# Patient Record
Sex: Male | Born: 1950
Health system: Southern US, Community
[De-identification: ages and names within clinical notes are randomized; demographics above are authoritative.]

## PROBLEM LIST (undated history)

## (undated) DIAGNOSIS — R42 Dizziness and giddiness: Secondary | ICD-10-CM

## (undated) DIAGNOSIS — I639 Cerebral infarction, unspecified: Secondary | ICD-10-CM

## (undated) DIAGNOSIS — F329 Major depressive disorder, single episode, unspecified: Secondary | ICD-10-CM

## (undated) DIAGNOSIS — A539 Syphilis, unspecified: Secondary | ICD-10-CM

## (undated) DIAGNOSIS — E785 Hyperlipidemia, unspecified: Secondary | ICD-10-CM

## (undated) DIAGNOSIS — G459 Transient cerebral ischemic attack, unspecified: Secondary | ICD-10-CM

## (undated) DIAGNOSIS — F32A Depression, unspecified: Secondary | ICD-10-CM

## (undated) HISTORY — DX: Hyperlipidemia, unspecified: E78.5

## (undated) HISTORY — DX: Depression, unspecified: F32.A

## (undated) HISTORY — DX: Major depressive disorder, single episode, unspecified: F32.9

## (undated) HISTORY — PX: KNEE ARTHROSCOPY: SHX127

---

## 2010-06-05 ENCOUNTER — Ambulatory Visit: Payer: Self-pay

## 2013-05-14 LAB — HM HIV SCREENING LAB: HM HIV SCREENING: NEGATIVE

## 2013-05-14 LAB — HM HEPATITIS C SCREENING LAB: HM Hepatitis Screen: NEGATIVE

## 2015-03-16 ENCOUNTER — Ambulatory Visit (INDEPENDENT_AMBULATORY_CARE_PROVIDER_SITE_OTHER): Payer: Self-pay | Admitting: Family Medicine

## 2015-03-16 ENCOUNTER — Encounter: Payer: Self-pay | Admitting: Family Medicine

## 2015-03-16 VITALS — BP 125/66 | HR 98 | Resp 16 | Ht 72.0 in | Wt 183.0 lb

## 2015-03-16 DIAGNOSIS — G2581 Restless legs syndrome: Secondary | ICD-10-CM | POA: Insufficient documentation

## 2015-03-16 DIAGNOSIS — F32A Depression, unspecified: Secondary | ICD-10-CM

## 2015-03-16 DIAGNOSIS — R202 Paresthesia of skin: Secondary | ICD-10-CM | POA: Insufficient documentation

## 2015-03-16 DIAGNOSIS — F329 Major depressive disorder, single episode, unspecified: Secondary | ICD-10-CM

## 2015-03-16 DIAGNOSIS — Z23 Encounter for immunization: Secondary | ICD-10-CM

## 2015-03-16 DIAGNOSIS — Z1211 Encounter for screening for malignant neoplasm of colon: Secondary | ICD-10-CM

## 2015-03-16 MED ORDER — ESCITALOPRAM OXALATE 10 MG PO TABS: 10.0000 mg | ORAL_TABLET | Freq: Every day | ORAL | Status: DC

## 2015-03-16 NOTE — Progress Notes (Signed)
Name: Tony Franco   MRN: [id number]    DOB: [birth year]   Date:03/16/2015       Progress Note  Subjective  Chief Complaint  Chief Complaint  Patient presents with  . Insomnia    Trazadone Rx from past not working    HPI Here for f/u of insomnia.  He has had some depressive issues.  Very emotional.  Sleeping problems.  Lots of family health issues.  Feels down and blue.  Having son=mew chronic pain issues. Mood swings.  He has taken antidepressants multiple times over the past 20 or more years.  No problem-specific assessment & plan notes found for this encounter.   Past Medical History  Diagnosis Date  . Hypertension     Past Surgical History  Procedure Laterality Date  . Knee arthroscopy      left knee    Family History  Problem Relation Age of Onset  . Heart attack Mother   . Heart attack Father     Social History   Social History  . Marital Status: Single    Spouse Name: N/A  . Number of Children: N/A  . Years of Education: N/A   Occupational History  . Not on file.   Social History Main Topics  . Smoking status: Never Smoker   . Smokeless tobacco: Never Used  . Alcohol Use: No  . Drug Use: No  . Sexual Activity: Not on file   Other Topics Concern  . Not on file   Social History Narrative  . No narrative on file     Current outpatient prescriptions:  .  escitalopram (LEXAPRO) 10 MG tablet, Take 1 tablet (10 mg total) by mouth daily., Disp: 30 tablet, Rfl: 6  Not on File   Review of Systems  Constitutional: Positive for malaise/fatigue. Negative for fever, chills and weight loss.  HENT: Negative for hearing loss.   Eyes: Negative for blurred vision and double vision.  Respiratory: Negative for cough, shortness of breath and wheezing.   Cardiovascular: Negative for chest pain, palpitations and leg swelling.  Gastrointestinal: Negative for heartburn, abdominal pain and blood in stool.  Genitourinary: Positive for frequency (nocturia). Negative  for dysuria and urgency.  Musculoskeletal: Positive for joint pain and neck pain. Back pain: knees, shoulders.  Skin: Negative for rash.  Neurological: Negative for dizziness, tremors, weakness and headaches.  Psychiatric/Behavioral: Positive for depression. The patient has insomnia.       Objective  Filed Vitals:   03/16/15 0858  BP: 125/66  Pulse: 98  Resp: 16  Height: 6' (1.829 m)  Weight: 183 lb (83.008 kg)    Physical Exam  Constitutional: He is oriented to person, place, and time and well-developed, well-nourished, and in no distress.  HENT:  Head: Normocephalic and atraumatic.  Eyes: Conjunctivae and EOM are normal. Pupils are equal, round, and reactive to light. No scleral icterus.  Neck: Normal range of motion. Neck supple. Carotid bruit is not present. No thyromegaly present.  Cardiovascular: Normal rate and regular rhythm.  Exam reveals no gallop and no friction rub.   No murmur heard. Pulmonary/Chest: Effort normal and breath sounds normal. No respiratory distress. He has no wheezes. He has no rales.  Abdominal: Soft. Bowel sounds are normal. He exhibits no distension, no abdominal bruit and no mass. There is no tenderness.  Musculoskeletal: He exhibits no edema.  Lymphadenopathy:    He has no cervical adenopathy.  Neurological: He is alert and oriented to person, place, and time.  Vitals reviewed.      No results found for this or any previous visit (from the past 2160 hour(s)).   Assessment & Plan  Problem List Items Addressed This Visit      Other   Need for influenza vaccination   Relevant Orders   Flu Vaccine QUAD 36+ mos PF IM (Fluarix & Fluzone Quad PF) (Completed)   Colon cancer screening   Relevant Orders   Ambulatory referral to Gastroenterology   Depression - Primary   Relevant Medications   escitalopram (LEXAPRO) 10 MG tablet      Meds ordered this encounter  Medications  . DISCONTD: baclofen (LIORESAL) 10 MG tablet    Sig: Take  10 mg by mouth.  . DISCONTD: cyanocobalamin (,VITAMIN B-12,) 1000 MCG/ML injection    Sig: 1000 mcg every day for 1 week, then 1000 mcg weekly for 1 month, then continue with monthly injections  . DISCONTD: escitalopram (LEXAPRO) 20 MG tablet    Sig: Take 20 mg by mouth.  . DISCONTD: gabapentin (NEURONTIN) 100 MG capsule    Sig: Take 100 mg by mouth.  . DISCONTD: ibuprofen (ADVIL,MOTRIN) 800 MG tablet    Sig: Take 800 mg by mouth.  . DISCONTD: traZODone (DESYREL) 50 MG tablet    Sig: Take 50 mg by mouth at bedtime. Reported on 03/16/2015  . escitalopram (LEXAPRO) 10 MG tablet    Sig: Take 1 tablet (10 mg total) by mouth daily.    Dispense:  30 tablet    Refill:  6   1. Need for influenza vaccination  - Flu Vaccine QUAD 36+ mos PF IM (Fluarix & Fluzone Quad PF)  2. Depression  - escitalopram (LEXAPRO) 10 MG tablet; Take 1 tablet (10 mg total) by mouth daily.  Dispense: 30 tablet; Refill: 6  3. Colon cancer screening  - Ambulatory referral to Gastroenterology

## 2015-03-17 ENCOUNTER — Telehealth: Payer: Self-pay | Admitting: Family Medicine

## 2015-03-17 NOTE — Telephone Encounter (Signed)
Pt advised.

## 2015-03-17 NOTE — Telephone Encounter (Signed)
Would start with 10 mg.  Can increase to 20 mg in about 3-4 weeks if not at desired response level.-jh

## 2015-03-17 NOTE — Telephone Encounter (Signed)
Pt  Called states that he wanted you to be aware that he was on  lexapro  20 mg  Last yr (Jun 22, 2014) was not sure if this medication should be increase from 10 mg to 20 mg. Pt call back # is  [phone]

## 2015-03-19 ENCOUNTER — Encounter: Payer: Self-pay | Admitting: *Deleted

## 2015-04-20 ENCOUNTER — Ambulatory Visit (INDEPENDENT_AMBULATORY_CARE_PROVIDER_SITE_OTHER): Payer: BLUE CROSS/BLUE SHIELD | Admitting: Family Medicine

## 2015-04-20 ENCOUNTER — Ambulatory Visit (INDEPENDENT_AMBULATORY_CARE_PROVIDER_SITE_OTHER): Payer: BLUE CROSS/BLUE SHIELD | Admitting: General Surgery

## 2015-04-20 ENCOUNTER — Encounter: Payer: Self-pay | Admitting: General Surgery

## 2015-04-20 ENCOUNTER — Encounter: Payer: Self-pay | Admitting: Family Medicine

## 2015-04-20 VITALS — BP 116/67 | HR 72 | Resp 16 | Ht 72.0 in | Wt 185.2 lb

## 2015-04-20 VITALS — BP 120/72 | HR 68 | Resp 12 | Ht 72.0 in | Wt 186.0 lb

## 2015-04-20 DIAGNOSIS — Z1211 Encounter for screening for malignant neoplasm of colon: Secondary | ICD-10-CM

## 2015-04-20 DIAGNOSIS — N522 Drug-induced erectile dysfunction: Secondary | ICD-10-CM

## 2015-04-20 DIAGNOSIS — G4700 Insomnia, unspecified: Secondary | ICD-10-CM | POA: Diagnosis not present

## 2015-04-20 DIAGNOSIS — F32A Depression, unspecified: Secondary | ICD-10-CM

## 2015-04-20 DIAGNOSIS — F329 Major depressive disorder, single episode, unspecified: Secondary | ICD-10-CM

## 2015-04-20 DIAGNOSIS — N529 Male erectile dysfunction, unspecified: Secondary | ICD-10-CM | POA: Insufficient documentation

## 2015-04-20 MED ORDER — POLYETHYLENE GLYCOL 3350 17 GM/SCOOP PO POWD: ORAL | Status: DC

## 2015-04-20 NOTE — Patient Instructions (Addendum)
Colonoscopy A colonoscopy is an exam to look at the entire large intestine (colon). This exam can help find problems such as tumors, polyps, inflammation, and areas of bleeding. The exam takes about 1 hour.  LET [facility] CARE PROVIDER KNOW ABOUT:   Any allergies you have.  All medicines you are taking, including vitamins, herbs, eye drops, creams, and over-the-counter medicines.  Previous problems you or members of your family have had with the use of anesthetics.  Any blood disorders you have.  Previous surgeries you have had.  Medical conditions you have. RISKS AND COMPLICATIONS  Generally, this is a safe procedure. However, as with any procedure, complications can occur. Possible complications include:  Bleeding.  Tearing or rupture of the colon wall.  Reaction to medicines given during the exam.  Infection (rare). BEFORE THE PROCEDURE   Ask your health care provider about changing or stopping your regular medicines.  You may be prescribed an oral bowel prep. This involves drinking a large amount of medicated liquid, starting the day before your procedure. The liquid will cause you to have multiple loose stools until your stool is almost clear or light green. This cleans out your colon in preparation for the procedure.  Do not eat or drink anything else once you have started the bowel prep, unless your health care provider tells you it is safe to do so.  Arrange for someone to drive you home after the procedure. PROCEDURE   You will be given medicine to help you relax (sedative).  You will lie on your side with your knees bent.  A long, flexible tube with a light and camera on the end (colonoscope) will be inserted through the rectum and into the colon. The camera sends video back to a computer screen as it moves through the colon. The colonoscope also releases carbon dioxide gas to inflate the colon. This helps your health care provider see the area better.  During  the exam, your health care provider may take a small tissue sample (biopsy) to be examined under a microscope if any abnormalities are found.  The exam is finished when the entire colon has been viewed. AFTER THE PROCEDURE   Do not drive for 24 hours after the exam.  You may have a small amount of blood in your stool.  You may pass moderate amounts of gas and have mild abdominal cramping or bloating. This is caused by the gas used to inflate your colon during the exam.  Ask when your test results will be ready and how you will get your results. Make sure you get your test results.   This information is not intended to replace advice given to you by your health care provider. Make sure you discuss any questions you have with your health care provider.   Document Released: 01/22/2000 Document Revised: 11/14/2012 Document Reviewed: 10/01/2012 Elsevier Interactive Patient Education [employer].  Patient has been scheduled for a colonoscopy on 04-28-15 at [facility].

## 2015-04-20 NOTE — Progress Notes (Signed)
Name: Tony Franco   MRN: [id number]    DOB: 23-Jan-1951   Date:04/20/2015       Progress Note  Subjective  Chief Complaint  Chief Complaint  Patient presents with  . Depression    HPI Here for f/u of depression and insomnia.  He is sleeping well after initial delayed going to sleep.   He does c/o ED with the Lexapro.     No problem-specific assessment & plan notes found for this encounter.   Past Medical History  Diagnosis Date  . Hypertension     Past Surgical History  Procedure Laterality Date  . Knee arthroscopy      left knee    Family History  Problem Relation Age of Onset  . Heart attack Mother   . Heart attack Father     Social History   Social History  . Marital Status: Single    Spouse Name: N/A  . Number of Children: N/A  . Years of Education: N/A   Occupational History  . Not on file.   Social History Main Topics  . Smoking status: Never Smoker   . Smokeless tobacco: Never Used  . Alcohol Use: No  . Drug Use: No  . Sexual Activity: Not on file   Other Topics Concern  . Not on file   Social History Narrative     Current outpatient prescriptions:  .  doxylamine, Sleep, (UNISOM) 25 MG tablet, Take 25 mg by mouth at bedtime as needed., Disp: , Rfl:  .  escitalopram (LEXAPRO) 10 MG tablet, Take 1 tablet (10 mg total) by mouth daily., Disp: 30 tablet, Rfl: 6  Not on File   Review of Systems  Constitutional: Negative for fever, chills, weight loss and malaise/fatigue.  HENT: Negative for hearing loss.   Eyes: Negative for blurred vision and double vision.  Respiratory: Negative for cough, shortness of breath and wheezing.   Cardiovascular: Negative for chest pain, palpitations and leg swelling.  Gastrointestinal: Negative for heartburn, abdominal pain and blood in stool.  Genitourinary: Negative for dysuria, urgency and frequency.       Erectile dysfuncton  Skin: Negative for rash.  Neurological: Negative for weakness and headaches.   Psychiatric/Behavioral: Positive for depression. The patient has insomnia.       Objective  Filed Vitals:   04/20/15 1029  BP: 116/67  Pulse: 72  Resp: 16  Height: 6' (1.829 m)  Weight: 185 lb 3.2 oz (84.006 kg)    Physical Exam  Constitutional: He is oriented to person, place, and time and well-developed, well-nourished, and in no distress. No distress.  HENT:  Head: Normocephalic and atraumatic.  Eyes: Conjunctivae and EOM are normal. Pupils are equal, round, and reactive to light. No scleral icterus.  Neck: Normal range of motion. Neck supple. No thyromegaly present.  Cardiovascular: Normal rate, regular rhythm and normal heart sounds.  Exam reveals no gallop and no friction rub.   No murmur heard. Pulmonary/Chest: Effort normal and breath sounds normal. No respiratory distress. He has no wheezes. He has no rales.  Abdominal: Soft. Bowel sounds are normal. He exhibits no distension and no mass. There is no tenderness.  Musculoskeletal: He exhibits no edema.  Lymphadenopathy:    He has no cervical adenopathy.  Neurological: He is alert and oriented to person, place, and time.  Vitals reviewed.      No results found for this or any previous visit (from the past 2160 hour(s)).   Assessment & Plan  Problem  List Items Addressed This Visit      Genitourinary   ED (erectile dysfunction) - Primary     Other   Depression   Insomnia      Meds ordered this encounter  Medications  . doxylamine, Sleep, (UNISOM) 25 MG tablet    Sig: Take 25 mg by mouth at bedtime as needed.   1. Depression Cont. Lexapro  2. Insomnia Cont Unisom  3. Drug-induced erectile dysfunction Try Sildenifil, 20 mg., 1-5 prn

## 2015-04-20 NOTE — Progress Notes (Signed)
Patient ID: Tony Franco, male   DOB: 02-29-1950, 65 y.o.   MRN: [id number]  Chief Complaint  Patient presents with  . Colonoscopy    [name] is a 65 y.o. male here today for a evaluation of a screening colonoscopy. No previous colonoscopy. No GI problems at this time. No family history of cancer. I have reviewed the history of present illness with the patient.   HPI  Past Medical History  Diagnosis Date  . Hypertension     Past Surgical History  Procedure Laterality Date  . Knee arthroscopy      left knee    Family History  Problem Relation Age of Onset  . Heart attack Mother   . Heart attack Father     Social History Social History  Substance Use Topics  . Smoking status: Never Smoker   . Smokeless tobacco: Never Used  . Alcohol Use: No    No Known Allergies  Current Outpatient Prescriptions  Medication Sig Dispense Refill  . doxylamine, Sleep, (UNISOM) 25 MG tablet Take 25 mg by mouth at bedtime as needed.    Marland Kitchen escitalopram (LEXAPRO) 10 MG tablet Take 1 tablet (10 mg total) by mouth daily. 30 tablet 6  . polyethylene glycol powder (GLYCOLAX/MIRALAX) powder 255 grams one bottle for colonoscopy prep 255 g 0   No current facility-administered medications for this visit.    Review of Systems Review of Systems  Constitutional: Negative.   Respiratory: Negative.   Cardiovascular: Negative.     Blood pressure 120/72, pulse 68, resp. rate 12, height 6' (1.829 m), weight 186 lb (84.369 kg).  Physical Exam Physical Exam  Constitutional: He is oriented to person, place, and time. He appears well-developed and well-nourished.  Eyes: Conjunctivae are normal. No scleral icterus.  Neck: Neck supple.  Cardiovascular: Normal rate, regular rhythm and normal heart sounds.   Pulmonary/Chest: Effort normal and breath sounds normal.  Abdominal: Soft. Normal appearance and bowel sounds are normal. There is no hepatomegaly. There is no tenderness. No hernia.     Lymphadenopathy:    He has no cervical adenopathy.  Neurological: He is alert and oriented to person, place, and time.  Skin: Skin is warm and dry.  Psychiatric: His behavior is normal.    Data Reviewed None  Assessment    Stable exam, no significant findings. Small lipoma on right side of abdomen noted. Pt at average risk for colon cancer. He was advised on role of screening colonoscopy.     Plan    Colonoscopy with possible biopsy/polypectomy prn: Information regarding the procedure, including its potential risks and complications (including but not limited to perforation of the bowel, which may require emergency surgery to repair, and bleeding) was verbally given to the patient. Educational information regarding lower intestinal endoscopy was given to the patient. Written instructions for how to complete the bowel prep using Miralax were provided. The importance of drinking ample fluids to avoid dehydration as a result of the prep emphasized.     Patient has been scheduled for a colonoscopy on 04-28-15 at [facility].   PCP:  Philbert Riser, Nikki Dom This information has been scribed by Gaspar Cola CMA.    SANKAR,SEEPLAPUTHUR G 04/20/2015, 2:08 PM

## 2015-04-28 ENCOUNTER — Encounter
Admission: RE | Disposition: A | Discharge status: Home / Self Care | Payer: Self-pay | Source: Ambulatory Visit | Attending: General Surgery

## 2015-04-28 ENCOUNTER — Ambulatory Visit
Admission: RE | Admit: 2015-04-28 | Discharge: 2015-04-28 | Disposition: A | Discharge status: Home / Self Care | Payer: BLUE CROSS/BLUE SHIELD | Source: Ambulatory Visit | Attending: General Surgery | Admitting: General Surgery

## 2015-04-28 ENCOUNTER — Encounter: Payer: Self-pay | Admitting: *Deleted

## 2015-04-28 ENCOUNTER — Ambulatory Visit: Payer: BLUE CROSS/BLUE SHIELD | Admitting: *Deleted

## 2015-04-28 DIAGNOSIS — K5730 Diverticulosis of large intestine without perforation or abscess without bleeding: Secondary | ICD-10-CM | POA: Insufficient documentation

## 2015-04-28 DIAGNOSIS — Z9889 Other specified postprocedural states: Secondary | ICD-10-CM | POA: Insufficient documentation

## 2015-04-28 DIAGNOSIS — Z1211 Encounter for screening for malignant neoplasm of colon: Secondary | ICD-10-CM | POA: Insufficient documentation

## 2015-04-28 DIAGNOSIS — Z8249 Family history of ischemic heart disease and other diseases of the circulatory system: Secondary | ICD-10-CM | POA: Diagnosis not present

## 2015-04-28 DIAGNOSIS — I10 Essential (primary) hypertension: Secondary | ICD-10-CM | POA: Insufficient documentation

## 2015-04-28 DIAGNOSIS — D171 Benign lipomatous neoplasm of skin and subcutaneous tissue of trunk: Secondary | ICD-10-CM | POA: Insufficient documentation

## 2015-04-28 DIAGNOSIS — Z79899 Other long term (current) drug therapy: Secondary | ICD-10-CM | POA: Insufficient documentation

## 2015-04-28 HISTORY — PX: COLONOSCOPY WITH PROPOFOL: SHX5780

## 2015-04-28 SURGERY — COLONOSCOPY WITH PROPOFOL
Anesthesia: General

## 2015-04-28 MED ORDER — SODIUM CHLORIDE 0.9 % IV SOLN
INTRAVENOUS | Status: DC
  Administered 2015-04-28: 1000 mL via INTRAVENOUS
  Administered 2015-04-28: 14:00:00 via INTRAVENOUS

## 2015-04-28 MED ORDER — PROPOFOL 500 MG/50ML IV EMUL
INTRAVENOUS | Status: DC | PRN
  Administered 2015-04-28: 100 ug/kg/min via INTRAVENOUS

## 2015-04-28 NOTE — Anesthesia Preprocedure Evaluation (Signed)
Anesthesia Evaluation  Patient identified by MRN, date of birth, ID band Patient awake    Reviewed: Allergy & Precautions, NPO status , Patient's Chart, lab work & pertinent test results  Airway Mallampati: II  TM Distance: >3 FB     Dental no notable dental hx.    Pulmonary neg pulmonary ROS,    Pulmonary exam normal        Cardiovascular hypertension, Pt. on medications Normal cardiovascular exam     Neuro/Psych Depression negative neurological ROS     GI/Hepatic negative GI ROS, Neg liver ROS,   Endo/Other  negative endocrine ROS  Renal/GU negative Renal ROS  negative genitourinary   Musculoskeletal negative musculoskeletal ROS (+)   Abdominal Normal abdominal exam  (+)   Peds negative pediatric ROS (+)  Hematology negative hematology ROS (+)   Anesthesia Other Findings   Reproductive/Obstetrics                             Anesthesia Physical Anesthesia Plan  ASA: II  Anesthesia Plan: General   Post-op Pain Management:    Induction: Intravenous  Airway Management Planned: Nasal Cannula  Additional Equipment:   Intra-op Plan:   Post-operative Plan:   Informed Consent: I have reviewed the patients History and Physical, chart, labs and discussed the procedure including the risks, benefits and alternatives for the proposed anesthesia with the patient or authorized representative who has indicated his/her understanding and acceptance.   Dental advisory given  Plan Discussed with: CRNA and Surgeon  Anesthesia Plan Comments:         Anesthesia Quick Evaluation

## 2015-04-28 NOTE — Op Note (Signed)
[facility] Patient Name: Tony Franco Procedure Date: 04/28/2015 2:19 PM MRN: [id number] Account #: [id number] Date of Birth: 1950/11/13 Admit Type: Outpatient Age: 65 Room: [facility] ENDO ROOM 4 Gender: Male Note Status: Finalized Procedure:            Colonoscopy Indications:          Screening for colorectal malignant neoplasm Providers:            Fredda Clarida G. Jamal Collin, MD Referring MD:         Arlis Porta, MD (Referring MD) Medicines:            General Anesthesia Complications:        No immediate complications. Procedure:            Pre-Anesthesia Assessment:                       - General anesthesia under the supervision of an                        anesthesiologist was determined to be medically                        necessary for this procedure based on review of the                        patient's medical history, medications, and prior                        anesthesia history.                       After obtaining informed consent, the colonoscope was                        passed under direct vision. Throughout the procedure,                        the patient's blood pressure, pulse, and oxygen                        saturations were monitored continuously. The Olympus                        PCF-H180AL colonoscope ( S#: [id number] ) was introduced                        through the anus and advanced to the the cecum,                        identified by the ileocecal valve. The colonoscopy was                        performed without difficulty. The patient tolerated the                        procedure well. The quality of the bowel preparation                        was excellent. Findings:      The perianal and digital rectal examinations were normal.  A few small-mouthed diverticula were found in the sigmoid colon,       descending colon and transverse colon.      The exam was otherwise without abnormality on direct  and retroflexion       views. Impression:           - Diverticulosis in the sigmoid colon, in the                        descending colon and in the transverse colon.                       - The examination was otherwise normal on direct and                        retroflexion views.                       - No specimens collected. Recommendation:       - Discharge patient to home.                       - Return to primary care physician as previously                        scheduled. Procedure Code(s):    --- Professional ---                       [phone], Colonoscopy, flexible; diagnostic, including                        collection of specimen(s) by brushing or washing, when                        performed (separate procedure) Diagnosis Code(s):    --- Professional ---                       Z12.11, Encounter for screening for malignant neoplasm                        of colon                       K57.30, Diverticulosis of large intestine without                        perforation or abscess without bleeding CPT copyright 2016 American Medical Association. All rights reserved. The codes documented in this report are preliminary and upon coder review may  be revised to meet current compliance requirements. Christene Lye, MD 04/28/2015 2:41:04 PM This report has been signed electronically. Number of Addenda: 0 Note Initiated On: 04/28/2015 2:19 PM Scope Withdrawal Time: 0 hours 5 minutes 26 seconds  Total Procedure Duration: 0 hours 13 minutes 9 seconds       [facility]

## 2015-04-28 NOTE — Transfer of Care (Signed)
Immed iate Anesthesia Transfer of Care Note  Patient: Tony Franco  Procedure(s) Performed: Procedure(s): COLONOSCOPY WITH PROPOFOL (N/A)  Patient Location: PACU  Anesthesia Type:General  Level of Consciousness: awake, alert  and oriented  Airway & Oxygen Therapy: Patient Spontanous Breathing and Patient connected to nasal cannula oxygen  Post-op Assessment: Report given to RN and Post -op Vital signs reviewed and stable  Post vital signs: Reviewed and stable  Last Vitals:  Filed Vitals:   04/28/15 1316 04/28/15 1445  BP: 126/81 107/73  Pulse: 104 72  Temp: 36.2 C 36.5 C  Resp: 20 16    Complications: No apparent anesthesia complications

## 2015-04-28 NOTE — H&P (View-Only) (Signed)
Patient ID: Tony Franco, male   DOB: 1950-07-07, 65 y.o.   MRN: [id number]  Chief Complaint  Patient presents with  . Colonoscopy    HPI Tony Franco is a 65 y.o. male here today for a evaluation of a screening colonoscopy. No previous colonoscopy. No GI problems at this time. No family history of cancer. I have reviewed the history of present illness with the patient.   HPI  Past Medical History  Diagnosis Date  . Hypertension     Past Surgical History  Procedure Laterality Date  . Knee arthroscopy      left knee    Family History  Problem Relation Age of Onset  . Heart attack Mother   . Heart attack Father     Social History Social History  Substance Use Topics  . Smoking status: Never Smoker   . Smokeless tobacco: Never Used  . Alcohol Use: No    No Known Allergies  Current Outpatient Prescriptions  Medication Sig Dispense Refill  . doxylamine, Sleep, (UNISOM) 25 MG tablet Take 25 mg by mouth at bedtime as needed.    Marland Kitchen escitalopram (LEXAPRO) 10 MG tablet Take 1 tablet (10 mg total) by mouth daily. 30 tablet 6  . polyethylene glycol powder (GLYCOLAX/MIRALAX) powder 255 grams one bottle for colonoscopy prep 255 g 0   No current facility-administered medications for this visit.    Review of Systems Review of Systems  Constitutional: Negative.   Respiratory: Negative.   Cardiovascular: Negative.     Blood pressure 120/72, pulse 68, resp. rate 12, height 6' (1.829 m), weight 186 lb (84.369 kg).  Physical Exam Physical Exam  Constitutional: He is oriented to person, place, and time. He appears well-developed and well-nourished.  Eyes: Conjunctivae are normal. No scleral icterus.  Neck: Neck supple.  Cardiovascular: Normal rate, regular rhythm and normal heart sounds.   Pulmonary/Chest: Effort normal and breath sounds normal.  Abdominal: Soft. Normal appearance and bowel sounds are normal. There is no hepatomegaly. There is no tenderness. No hernia.     Lymphadenopathy:    He has no cervical adenopathy.  Neurological: He is alert and oriented to person, place, and time.  Skin: Skin is warm and dry.  Psychiatric: His behavior is normal.    Data Reviewed None  Assessment    Stable exam, no significant findings. Small lipoma on right side of abdomen noted. Pt at average risk for colon cancer. He was advised on role of screening colonoscopy.     Plan    Colonoscopy with possible biopsy/polypectomy prn: Information regarding the procedure, including its potential risks and complications (including but not limited to perforation of the bowel, which may require emergency surgery to repair, and bleeding) was verbally given to the patient. Educational information regarding lower intestinal endoscopy was given to the patient. Written instructions for how to complete the bowel prep using Miralax were provided. The importance of drinking ample fluids to avoid dehydration as a result of the prep emphasized.     Patient has been scheduled for a colonoscopy on 04-28-15 at [facility].   PCP:  Philbert Riser, Nikki Dom This information has been scribed by Gaspar Cola CMA.    SANKAR,SEEPLAPUTHUR G 04/20/2015, 2:08 PM

## 2015-04-28 NOTE — Anesthesia Postprocedure Evaluation (Signed)
Anesthesia Post Note  Patient: Tony Franco  Procedure(s) Performed: Procedure(s) (LRB): COLONOSCOPY WITH PROPOFOL (N/A)  Patient location during evaluation: PACU Anesthesia Type: General Level of consciousness: awake and alert and oriented Pain management: pain level controlled Vital Signs Assessment: post-procedure vital signs reviewed and stable Respiratory status: spontaneous breathing Cardiovascular status: blood pressure returned to baseline Anesthetic complications: no    Last Vitals:  Filed Vitals:   04/28/15 1505 04/28/15 1515  BP: 120/83 125/83  Pulse: 78 72  Temp:    Resp: 20 13    Last Pain: There were no vitals filed for this visit.               Glenis Musolf

## 2015-04-28 NOTE — Interval H&P Note (Signed)
History and Physical Interval Note:  04/28/2015 2:13 PM  Tony Franco  has presented today for surgery, with the diagnosis of SCREENING  The various methods of treatment have been discussed with the patient and family. After consideration of risks, benefits and other options for treatment, the patient has consented to  Procedure(s): COLONOSCOPY WITH PROPOFOL (N/A) as a surgical intervention .  The patient's history has been reviewed, patient examined, no change in status, stable for surgery.  I have reviewed the patient's chart and labs.  Questions were answered to the patient's satisfaction.     SANKAR,SEEPLAPUTHUR G

## 2015-04-29 ENCOUNTER — Encounter: Payer: Self-pay | Admitting: General Surgery

## 2015-04-29 NOTE — Addendum Note (Signed)
Addendum  created 04/29/15 1102 by [place] edited: Anesthesia Responsible Staff

## 2015-05-18 ENCOUNTER — Ambulatory Visit: Payer: Self-pay | Admitting: Family Medicine

## 2015-07-30 DIAGNOSIS — R079 Chest pain, unspecified: Secondary | ICD-10-CM | POA: Diagnosis not present

## 2015-07-30 DIAGNOSIS — R0789 Other chest pain: Secondary | ICD-10-CM | POA: Diagnosis not present

## 2015-08-12 ENCOUNTER — Telehealth: Payer: Self-pay | Admitting: Family Medicine

## 2015-08-12 NOTE — Telephone Encounter (Signed)
Pt. Called wanted to know the name of a ophthalmologist/ Pt call call back # is [phone]

## 2015-08-12 NOTE — Telephone Encounter (Signed)
Returned call to patient and left vmail # for [facility].

## 2015-08-17 ENCOUNTER — Ambulatory Visit (INDEPENDENT_AMBULATORY_CARE_PROVIDER_SITE_OTHER): Payer: BLUE CROSS/BLUE SHIELD | Admitting: Family Medicine

## 2015-08-17 ENCOUNTER — Encounter: Payer: Self-pay | Admitting: Family Medicine

## 2015-08-17 VITALS — BP 94/59 | HR 71 | Temp 98.0°F | Resp 16 | Ht 72.0 in | Wt 184.0 lb

## 2015-08-17 DIAGNOSIS — G4700 Insomnia, unspecified: Secondary | ICD-10-CM | POA: Diagnosis not present

## 2015-08-17 DIAGNOSIS — F329 Major depressive disorder, single episode, unspecified: Secondary | ICD-10-CM | POA: Diagnosis not present

## 2015-08-17 DIAGNOSIS — F32A Depression, unspecified: Secondary | ICD-10-CM

## 2015-08-17 MED ORDER — TRAZODONE HCL 50 MG PO TABS: ORAL_TABLET | ORAL | Status: DC

## 2015-08-17 MED ORDER — ESCITALOPRAM OXALATE 10 MG PO TABS: 10.0000 mg | ORAL_TABLET | Freq: Every day | ORAL | Status: DC

## 2015-08-17 NOTE — Patient Instructions (Signed)
May increase to 2, 50 mg tabs at night in 4-6 weeks if needed.

## 2015-08-17 NOTE — Progress Notes (Signed)
Name: Tony Franco   MRN: [id number]    DOB: 10-29-1950   Date:08/17/2015       Progress Note  Subjective  Chief Complaint  Chief Complaint  Patient presents with  . Depression  . Insomnia    HPI Here for f/u of depression and insomnia.  PHQ-9 score of 6.  He still has major problems with insomnia.  Nothing OTC works.  Feels very tired due to poor sleep.  Trouble going to sleep and staying asleep.  Had episode of chest pain 10 days ago that lasted 10-15 min.  To ER for w/u.  No acute problems found.  He has had episodes of this in past that has always had neg card w/u.  No problem-specific assessment & plan notes found for this encounter.   Past Medical History  Diagnosis Date  . Hypertension     Past Surgical History  Procedure Laterality Date  . Knee arthroscopy      left knee  . Colonoscopy with propofol N/A 04/28/2015    Procedure: COLONOSCOPY WITH PROPOFOL;  Surgeon: Christene Lye, MD;  Location: [facility] ENDOSCOPY;  Service: Endoscopy;  Laterality: N/A;    Family History  Problem Relation Age of Onset  . Heart attack Mother   . Heart attack Father     Social History   Social History  . Marital Status: Single    Spouse Name: N/A  . Number of Children: N/A  . Years of Education: N/A   Occupational History  . Not on file.   Social History Main Topics  . Smoking status: Never Smoker   . Smokeless tobacco: Never Used  . Alcohol Use: 0.6 oz/week    1 Cans of beer per week  . Drug Use: No  . Sexual Activity: Not on file   Other Topics Concern  . Not on file   Social History Narrative     Current outpatient prescriptions:  .  escitalopram (LEXAPRO) 10 MG tablet, Take 1 tablet (10 mg total) by mouth daily., Disp: 30 tablet, Rfl: 6 .  doxylamine, Sleep, (UNISOM) 25 MG tablet, Take 25 mg by mouth at bedtime as needed. Reported on 08/17/2015, Disp: , Rfl:  .  traZODone (DESYREL) 50 MG tablet, Take 1 tablet at night for sleep, Disp: 30 tablet, Rfl:  6  Not on File   Review of Systems  Constitutional: Negative for fever, chills, weight loss and malaise/fatigue.  HENT: Negative for hearing loss.   Eyes: Negative for blurred vision and double vision.  Respiratory: Negative for cough, shortness of breath and wheezing.   Cardiovascular: Negative for chest pain, palpitations and leg swelling.  Gastrointestinal: Negative for heartburn, abdominal pain and blood in stool.  Genitourinary: Negative for dysuria, urgency and frequency.  Musculoskeletal: Negative for myalgias and joint pain.  Skin: Negative for rash.  Neurological: Negative for dizziness, tremors, weakness and headaches.  Psychiatric/Behavioral: Positive for depression. The patient has insomnia.       Objective  Filed Vitals:   08/17/15 0921  BP: 94/59  Pulse: 71  Temp: 98 F (36.7 C)  TempSrc: Oral  Resp: 16  Height: 6' (1.829 m)  Weight: 184 lb (83.462 kg)    Physical Exam  Constitutional: He is oriented to person, place, and time and well-developed, well-nourished, and in no distress. No distress.  HENT:  Head: Normocephalic and atraumatic.  Eyes: Conjunctivae and EOM are normal. Pupils are equal, round, and reactive to light. No scleral icterus.  Neck: Normal range of  motion. Neck supple. No thyromegaly present.  Cardiovascular: Normal rate and regular rhythm.  Exam reveals no gallop and no friction rub.   No murmur heard. Pulmonary/Chest: Effort normal and breath sounds normal. No respiratory distress. He has no wheezes. He has no rales.  Abdominal: Bowel sounds are normal. He exhibits no distension and no mass. There is no tenderness.  Musculoskeletal: He exhibits no edema.  Lymphadenopathy:    He has no cervical adenopathy.  Neurological: He is alert and oriented to person, place, and time.  Vitals reviewed.      No results found for this or any previous visit (from the past 2160 hour(s)).   Assessment & Plan  Problem List Items Addressed  This Visit      Other   Depression   Relevant Medications   traZODone (DESYREL) 50 MG tablet   escitalopram (LEXAPRO) 10 MG tablet   Insomnia - Primary   Relevant Medications   traZODone (DESYREL) 50 MG tablet      Meds ordered this encounter  Medications  . traZODone (DESYREL) 50 MG tablet    Sig: Take 1 tablet at night for sleep    Dispense:  30 tablet    Refill:  6  . escitalopram (LEXAPRO) 10 MG tablet    Sig: Take 1 tablet (10 mg total) by mouth daily.    Dispense:  30 tablet    Refill:  6   1. Insomnia  - traZODone (DESYREL) 50 MG tablet; Take 1 tablet at night for sleep  Dispense: 30 tablet; Refill: 6  2. Depression  - escitalopram (LEXAPRO) 10 MG tablet; Take 1 tablet (10 mg total) by mouth daily.  Dispense: 30 tablet; Refill: 6

## 2015-08-18 DIAGNOSIS — H2513 Age-related nuclear cataract, bilateral: Secondary | ICD-10-CM | POA: Diagnosis not present

## 2015-11-10 ENCOUNTER — Ambulatory Visit (INDEPENDENT_AMBULATORY_CARE_PROVIDER_SITE_OTHER): Payer: BLUE CROSS/BLUE SHIELD | Admitting: Family Medicine

## 2015-11-10 ENCOUNTER — Encounter (INDEPENDENT_AMBULATORY_CARE_PROVIDER_SITE_OTHER): Payer: Self-pay

## 2015-11-10 ENCOUNTER — Encounter: Payer: Self-pay | Admitting: Family Medicine

## 2015-11-10 VITALS — BP 113/68 | HR 71 | Temp 98.0°F | Resp 16 | Ht 72.0 in | Wt 181.0 lb

## 2015-11-10 DIAGNOSIS — M779 Enthesopathy, unspecified: Secondary | ICD-10-CM | POA: Diagnosis not present

## 2015-11-10 DIAGNOSIS — Z23 Encounter for immunization: Secondary | ICD-10-CM | POA: Diagnosis not present

## 2015-11-10 DIAGNOSIS — F330 Major depressive disorder, recurrent, mild: Secondary | ICD-10-CM

## 2015-11-10 DIAGNOSIS — F5103 Paradoxical insomnia: Secondary | ICD-10-CM

## 2015-11-10 MED ORDER — ESCITALOPRAM OXALATE 20 MG PO TABS: 20.0000 mg | ORAL_TABLET | Freq: Every day | ORAL | 12 refills | Status: DC

## 2015-11-10 MED ORDER — HYDROXYZINE HCL 25 MG PO TABS: 25.0000 mg | ORAL_TABLET | Freq: Three times a day (TID) | ORAL | 0 refills | Status: DC | PRN

## 2015-11-10 MED ORDER — MELOXICAM 15 MG PO TABS: 15.0000 mg | ORAL_TABLET | Freq: Every day | ORAL | 6 refills | Status: DC

## 2015-11-10 NOTE — Progress Notes (Signed)
Name: Tony Franco   MRN: [id number]    DOB: [birth year]   Date:11/10/2015       Progress Note  Subjective  Chief Complaint  Chief Complaint  Patient presents with  . Arm Pain    HPI Here c/o R arm pain.  Started with lifting weights.  Both antecubital fossae started to hurt.  Rest from weights has helped L arm, but R arm cont to bother him.    He is having some trouble with his family.  His depression is not doing well.  He needs a therapist.  He is having more problems with sleep.  No problem-specific Assessment & Plan notes found for this encounter.   Past Medical History:  Diagnosis Date  . Hypertension     Past Surgical History:  Procedure Laterality Date  . COLONOSCOPY WITH PROPOFOL N/A 04/28/2015   Procedure: COLONOSCOPY WITH PROPOFOL;  Surgeon: Christene Lye, MD;  Location: [facility] ENDOSCOPY;  Service: Endoscopy;  Laterality: N/A;  . KNEE ARTHROSCOPY     left knee    Family History  Problem Relation Age of Onset  . Heart attack Mother   . Heart attack Father     Social History   Social History  . Marital status: Single    Spouse name: N/A  . Number of children: N/A  . Years of education: N/A   Occupational History  . Not on file.   Social History Main Topics  . Smoking status: Never Smoker  . Smokeless tobacco: Never Used  . Alcohol use 0.6 oz/week    1 Cans of beer per week  . Drug use: No  . Sexual activity: Not on file   Other Topics Concern  . Not on file   Social History Narrative  . No narrative on file     Current Outpatient Prescriptions:  .  escitalopram (LEXAPRO) 20 MG tablet, Take 1 tablet (20 mg total) by mouth daily., Disp: 30 tablet, Rfl: 12 .  traZODone (DESYREL) 50 MG tablet, Take 1 tablet at night for sleep, Disp: 30 tablet, Rfl: 6 .  hydrOXYzine (ATARAX/VISTARIL) 25 MG tablet, Take 1 tablet (25 mg total) by mouth 3 (three) times daily as needed., Disp: 30 tablet, Rfl: 0 .  meloxicam (MOBIC) 15 MG tablet, Take 1 tablet  (15 mg total) by mouth daily., Disp: 30 tablet, Rfl: 6  Not on File   Review of Systems  Constitutional: Negative for chills, fever, malaise/fatigue and weight loss.  HENT: Negative for hearing loss.   Eyes: Negative for blurred vision and double vision.  Respiratory: Negative for cough, shortness of breath and wheezing.   Cardiovascular: Negative for chest pain, palpitations and leg swelling.  Gastrointestinal: Negative for abdominal pain, constipation and heartburn.  Genitourinary: Negative for dysuria, frequency and urgency.  Musculoskeletal: Positive for myalgias (R arm pain).  Skin: Negative for rash.  Neurological: Negative for dizziness, tremors, weakness and headaches.  Psychiatric/Behavioral: Positive for depression. The patient is nervous/anxious and has insomnia.       Objective  Vitals:   11/10/15 0917  BP: 113/68  Pulse: 71  Resp: 16  Temp: 98 F (36.7 C)  TempSrc: Oral  Weight: 82.1 kg (181 lb)  Height: 6' (1.829 m)    Physical Exam  Constitutional: He is oriented to person, place, and time. He appears distressed.  HENT:  Head: Normocephalic and atraumatic.  Eyes: Conjunctivae and EOM are normal. Pupils are equal, round, and reactive to light. No scleral icterus.  Neck: Normal  range of motion. Neck supple. Carotid bruit is not present. No thyromegaly present.  Cardiovascular: Normal rate, regular rhythm and normal heart sounds.  Exam reveals no gallop and no friction rub.   No murmur heard. Pulmonary/Chest: Effort normal and breath sounds normal. No respiratory distress. He has no wheezes. He has no rales.  Musculoskeletal: He exhibits no edema.  Lymphadenopathy:    He has no cervical adenopathy.  Neurological: He is alert and oriented to person, place, and time.  Psychiatric:  Affect is depressed and somewhat anxious.  Vitals reviewed.      No results found for this or any previous visit (from the past 2160 hour(s)).   Assessment &  Plan  Problem List Items Addressed This Visit      Other   Depression   Relevant Medications   escitalopram (LEXAPRO) 20 MG tablet   hydrOXYzine (ATARAX/VISTARIL) 25 MG tablet   Insomnia   Relevant Medications   hydrOXYzine (ATARAX/VISTARIL) 25 MG tablet    Other Visit Diagnoses    Need for vaccination    -  Primary   Relevant Orders   Flu Vaccine QUAD 36+ mos PF IM (Fluarix & Fluzone Quad PF) (Completed)   Tendonitis       Relevant Medications   meloxicam (MOBIC) 15 MG tablet      Meds ordered this encounter  Medications  . escitalopram (LEXAPRO) 20 MG tablet    Sig: Take 1 tablet (20 mg total) by mouth daily.    Dispense:  30 tablet    Refill:  12  . hydrOXYzine (ATARAX/VISTARIL) 25 MG tablet    Sig: Take 1 tablet (25 mg total) by mouth 3 (three) times daily as needed.    Dispense:  30 tablet    Refill:  0  . meloxicam (MOBIC) 15 MG tablet    Sig: Take 1 tablet (15 mg total) by mouth daily.    Dispense:  30 tablet    Refill:  6   1. Need for vaccination  - Flu Vaccine QUAD 36+ mos PF IM (Fluarix & Fluzone Quad PF)  2. Mild episode of recurrent major depressive disorder (HCC)  - escitalopram (LEXAPRO) 20 MG tablet; Take 1 tablet (20 mg total) by mouth daily.  Dispense: 30 tablet; Refill: 12  3. Paradoxical insomnia  - hydrOXYzine (ATARAX/VISTARIL) 25 MG tablet; Take 1 tablet (25 mg total) by mouth 3 (three) times daily as needed.  Dispense: 30 tablet; Refill: 0  4. Tendonitis  - meloxicam (MOBIC) 15 MG tablet; Take 1 tablet (15 mg total) by mouth daily.  Dispense: 30 tablet; Refill: 6

## 2015-12-14 ENCOUNTER — Encounter: Payer: Self-pay | Admitting: Family Medicine

## 2015-12-14 ENCOUNTER — Ambulatory Visit (INDEPENDENT_AMBULATORY_CARE_PROVIDER_SITE_OTHER): Payer: BLUE CROSS/BLUE SHIELD | Admitting: Family Medicine

## 2015-12-14 VITALS — BP 142/77 | HR 68 | Temp 98.1°F | Resp 16 | Ht 72.0 in | Wt 185.0 lb

## 2015-12-14 DIAGNOSIS — F32A Depression, unspecified: Secondary | ICD-10-CM

## 2015-12-14 DIAGNOSIS — F329 Major depressive disorder, single episode, unspecified: Secondary | ICD-10-CM | POA: Diagnosis not present

## 2015-12-14 DIAGNOSIS — F5101 Primary insomnia: Secondary | ICD-10-CM

## 2015-12-14 DIAGNOSIS — F5103 Paradoxical insomnia: Secondary | ICD-10-CM

## 2015-12-14 DIAGNOSIS — M779 Enthesopathy, unspecified: Secondary | ICD-10-CM | POA: Diagnosis not present

## 2015-12-14 MED ORDER — HYDROXYZINE HCL 25 MG PO TABS: ORAL_TABLET | ORAL | 4 refills | Status: DC

## 2015-12-14 MED ORDER — MELOXICAM 15 MG PO TABS: 15.0000 mg | ORAL_TABLET | Freq: Every day | ORAL | 6 refills | Status: DC

## 2015-12-14 NOTE — Progress Notes (Signed)
Name: Tony Franco   MRN: [id number]    DOB: 1950-11-14   Date:12/14/2015       Progress Note  Subjective  Chief Complaint  Chief Complaint  Patient presents with  . Follow-up  . Arm Pain  . Depression    HPI Here for f/u of Arm pain (R).  Also with extra sense of stress and anxiety re: mainly family matters.   Still has pain in R antecubital fossa with holding weight (i.e. Coffee cup). No problem-specific Assessment & Plan notes found for this encounter.   Past Medical History:  Diagnosis Date  . Hypertension     Past Surgical History:  Procedure Laterality Date  . COLONOSCOPY WITH PROPOFOL N/A 04/28/2015   Procedure: COLONOSCOPY WITH PROPOFOL;  Surgeon: Christene Lye, MD;  Location: [facility] ENDOSCOPY;  Service: Endoscopy;  Laterality: N/A;  . KNEE ARTHROSCOPY     left knee    Family History  Problem Relation Age of Onset  . Heart attack Mother   . Heart attack Father     Social History   Social History  . Marital status: Single    Spouse name: N/A  . Number of children: N/A  . Years of education: N/A   Occupational History  . Not on file.   Social History Main Topics  . Smoking status: Never Smoker  . Smokeless tobacco: Never Used  . Alcohol use 0.6 oz/week    1 Cans of beer per week  . Drug use: No  . Sexual activity: Not on file   Other Topics Concern  . Not on file   Social History Narrative  . No narrative on file     Current Outpatient Prescriptions:  .  escitalopram (LEXAPRO) 20 MG tablet, Take 1 tablet (20 mg total) by mouth daily., Disp: 30 tablet, Rfl: 12 .  hydrOXYzine (ATARAX/VISTARIL) 25 MG tablet, Take 1 cap twice a day and 1 or 2 at bedtime, Disp: 90 tablet, Rfl: 4 .  meloxicam (MOBIC) 15 MG tablet, Take 1 tablet (15 mg total) by mouth daily., Disp: 30 tablet, Rfl: 6 .  traZODone (DESYREL) 50 MG tablet, Take 1 tablet at night for sleep, Disp: 30 tablet, Rfl: 6  Not on File   Review of Systems  Constitutional: Negative for  chills, fever, malaise/fatigue and weight loss.  HENT: Negative for hearing loss.   Eyes: Negative for blurred vision and double vision.  Respiratory: Negative for cough, shortness of breath and wheezing.   Cardiovascular: Negative for chest pain, palpitations and leg swelling.  Gastrointestinal: Negative for abdominal pain, blood in stool and heartburn.  Genitourinary: Negative for dysuria, frequency and urgency.  Musculoskeletal: Negative for joint pain and myalgias.  Skin: Negative for rash.  Neurological: Negative for dizziness, tingling and weakness.  Psychiatric/Behavioral: Positive for depression. The patient is nervous/anxious and has insomnia.       Objective  Vitals:   12/14/15 1532  BP: (!) 142/77  Pulse: 68  Resp: 16  Temp: 98.1 F (36.7 C)  TempSrc: Oral  Weight: 185 lb (83.9 kg)  Height: 6' (1.829 m)    Physical Exam  Constitutional: He is oriented to person, place, and time and well-developed, well-nourished, and in no distress. No distress.  HENT:  Head: Normocephalic and atraumatic.  Eyes: Conjunctivae and EOM are normal. Pupils are equal, round, and reactive to light. No scleral icterus.  Neck: Normal range of motion. Neck supple. No thyromegaly present.  Cardiovascular: Normal rate, regular rhythm and normal heart  sounds.  Exam reveals no gallop and no friction rub.   No murmur heard. Pulmonary/Chest: Effort normal and breath sounds normal. No respiratory distress. He has no wheezes. He has no rales.  Musculoskeletal: He exhibits no edema.  Lymphadenopathy:    He has no cervical adenopathy.  Neurological: He is alert and oriented to person, place, and time.  Psychiatric:   anxious  Vitals reviewed.      No results found for this or any previous visit (from the past 2160 hour(s)).   Assessment & Plan  Problem List Items Addressed This Visit      Musculoskeletal and Integument   Tendonitis   Relevant Medications   meloxicam (MOBIC) 15 MG  tablet     Other   Depression - Primary   Relevant Medications   hydrOXYzine (ATARAX/VISTARIL) 25 MG tablet   Insomnia   Relevant Medications   hydrOXYzine (ATARAX/VISTARIL) 25 MG tablet      Meds ordered this encounter  Medications  . hydrOXYzine (ATARAX/VISTARIL) 25 MG tablet    Sig: Take 1 cap twice a day and 1 or 2 at bedtime    Dispense:  90 tablet    Refill:  4  . meloxicam (MOBIC) 15 MG tablet    Sig: Take 1 tablet (15 mg total) by mouth daily.    Dispense:  30 tablet    Refill:  6   1. Depression, unspecified depression type Referred to Peggye Ley, Psychologist for counseling. Cont meds 2. Primary insomnia   3. Tendonitis  - meloxicam (MOBIC) 15 MG tablet; Take 1 tablet (15 mg total) by mouth daily.  Dispense: 30 tablet; Refill: 6  4. Paradoxical insomnia  - hydrOXYzine (ATARAX/VISTARIL) 25 MG tablet; Take 1 cap twice a day and 1 or 2 at bedtime  Dispense: 90 tablet; Refill: 4

## 2015-12-22 ENCOUNTER — Ambulatory Visit: Payer: BLUE CROSS/BLUE SHIELD | Admitting: Family Medicine

## 2016-01-01 DIAGNOSIS — B9789 Other viral agents as the cause of diseases classified elsewhere: Secondary | ICD-10-CM | POA: Diagnosis not present

## 2016-01-01 DIAGNOSIS — J069 Acute upper respiratory infection, unspecified: Secondary | ICD-10-CM | POA: Diagnosis not present

## 2016-02-02 DIAGNOSIS — B9689 Other specified bacterial agents as the cause of diseases classified elsewhere: Secondary | ICD-10-CM | POA: Diagnosis not present

## 2016-02-02 DIAGNOSIS — R05 Cough: Secondary | ICD-10-CM | POA: Diagnosis not present

## 2016-02-02 DIAGNOSIS — J208 Acute bronchitis due to other specified organisms: Secondary | ICD-10-CM | POA: Diagnosis not present

## 2016-02-11 ENCOUNTER — Ambulatory Visit (INDEPENDENT_AMBULATORY_CARE_PROVIDER_SITE_OTHER): Payer: BLUE CROSS/BLUE SHIELD | Admitting: Family Medicine

## 2016-02-11 ENCOUNTER — Encounter: Payer: Self-pay | Admitting: Family Medicine

## 2016-02-11 ENCOUNTER — Ambulatory Visit: Payer: BLUE CROSS/BLUE SHIELD | Admitting: Family Medicine

## 2016-02-11 VITALS — BP 116/79 | HR 83 | Temp 97.9°F | Resp 16 | Ht 72.0 in | Wt 185.0 lb

## 2016-02-11 DIAGNOSIS — F329 Major depressive disorder, single episode, unspecified: Secondary | ICD-10-CM | POA: Diagnosis not present

## 2016-02-11 DIAGNOSIS — J9801 Acute bronchospasm: Secondary | ICD-10-CM

## 2016-02-11 DIAGNOSIS — F32A Depression, unspecified: Secondary | ICD-10-CM

## 2016-02-11 DIAGNOSIS — M1990 Unspecified osteoarthritis, unspecified site: Secondary | ICD-10-CM | POA: Diagnosis not present

## 2016-02-11 DIAGNOSIS — M170 Bilateral primary osteoarthritis of knee: Secondary | ICD-10-CM | POA: Insufficient documentation

## 2016-02-11 MED ORDER — DULOXETINE HCL 30 MG PO CPEP: 30.0000 mg | ORAL_CAPSULE | Freq: Every day | ORAL | 3 refills | Status: DC

## 2016-02-11 MED ORDER — PREDNISONE 10 MG PO TABS: ORAL_TABLET | ORAL | 0 refills | Status: DC

## 2016-02-11 MED ORDER — MELOXICAM 15 MG PO TABS: 15.0000 mg | ORAL_TABLET | Freq: Every day | ORAL | 6 refills | Status: DC

## 2016-02-11 NOTE — Progress Notes (Signed)
Name: Tony Franco   MRN: [id number]    DOB: 1950/10/11   Date:02/11/2016       Progress Note  Subjective  Chief Complaint  Chief Complaint  Patient presents with  . Cough    HPI Here for f/u of bronchitis and possible  COPD.  X-ray at Urgent care showed possible changes, but were probably related to bronchitis.  He is finishing Doxy, but still with episodes of cough.  Also c/o L knee pain on and off for many years, but worse over past several months.  Had a bone spur operated  On many years ago and has some arthritic sx.  Also c/o not being where he wants on his Lexapro.  No more depressive sx, but not much joy in life.  No problem-specific Assessment & Plan notes found for this encounter.   Past Medical History:  Diagnosis Date  . Hypertension     Past Surgical History:  Procedure Laterality Date  . COLONOSCOPY WITH PROPOFOL N/A 04/28/2015   Procedure: COLONOSCOPY WITH PROPOFOL;  Surgeon: Christene Lye, MD;  Location: [facility] END[facility];  Service: Endoscopy;  Laterality: N/A;  . KNEE ARTHROSCOPY     left knee    Family History  Problem Relation Age of Onset  . Heart attack Mother   . Heart attack Father     Social History   Social History  . Marital status: Single    Spouse name: N/A  . Number of children: N/A  . Years of education: N/A   Occupational History  . Not on file.   Social History Main Topics  . Smoking status: Never Smoker  . Smokeless tobacco: Never Used  . Alcohol use 0.6 oz/week    1 Cans of beer per week  . Drug use: No  . Sexual activity: Not on file   Other Topics Concern  . Not on file   Social History Narrative  . No narrative on file     Current Outpatient Prescriptions:  .  escitalopram (LEXAPRO) 20 MG tablet, Take 1 tablet (20 mg total) by mouth daily., Disp: 30 tablet, Rfl: 12 .  hydrOXYzine (ATARAX/VISTARIL) 25 MG tablet, Take 1 cap twice a day and 1 or 2 at bedtime, Disp: 90 tablet, Rfl: 4 .  traZODone (DESYREL) 50  MG tablet, Take 1 tablet at night for sleep, Disp: 30 tablet, Rfl: 6 .  benzonatate (TESSALON) 200 MG capsule, Take 200 mg by mouth as directed., Disp: , Rfl: 0 .  doxycycline (VIBRAMYCIN) 100 MG capsule, Take 100 mg by mouth 2 (two) times daily., Disp: , Rfl: 0 .  DULoxetine (CYMBALTA) 30 MG capsule, Take 1 capsule (30 mg total) by mouth daily., Disp: 30 capsule, Rfl: 3 .  meloxicam (MOBIC) 15 MG tablet, Take 1 tablet (15 mg total) by mouth daily., Disp: 30 tablet, Rfl: 6 .  predniSONE (DELTASONE) 10 MG tablet, Take 3 tablets daily for 2 days, 2 tablets daily for 2 days, then 1 tablet daily for 2 days (3, 3, 2, 2, 1, 1.), Disp: 12 tablet, Rfl: 0 .  PROAIR HFA 108 (90 Base) MCG/ACT inhaler, Inhale 1 puff into the lungs as directed., Disp: , Rfl: 0  Not on File   Review of Systems  Constitutional: Negative for chills, fever, malaise/fatigue and weight loss.  HENT: Negative for hearing loss and tinnitus.   Eyes: Negative for blurred vision and double vision.  Respiratory: Positive for cough and shortness of breath (woith cough and with some increased exerecise).  Negative for sputum production and wheezing.   Cardiovascular: Negative for chest pain, palpitations and leg swelling.  Gastrointestinal: Negative for abdominal pain, blood in stool and heartburn.  Genitourinary: Negative for dysuria, frequency and urgency.  Musculoskeletal: Positive for joint pain (L knee).  Skin: Negative for rash.  Neurological: Negative for dizziness, tingling, tremors, weakness and headaches.  Psychiatric/Behavioral: Positive for depression. The patient has insomnia (improved). The patient is not nervous/anxious.       Objective  Vitals:   02/11/16 1028  BP: 116/79  Pulse: 83  Resp: 16  Temp: 97.9 F (36.6 C)  TempSrc: Oral  Weight: 185 lb (83.9 kg)  Height: 6' (1.829 m)    Physical Exam  Constitutional: He is oriented to person, place, and time and well-developed, well-nourished, and in no  distress. No distress.  HENT:  Head: Normocephalic and atraumatic.  Right Ear: External ear normal.  Left Ear: External ear normal.  Nose: Nose normal.  Mouth/Throat: Oropharynx is clear and moist.  Neck: Normal range of motion. Neck supple. Carotid bruit is not present. No thyromegaly present.  Cardiovascular: Normal rate, regular rhythm and normal heart sounds.  Exam reveals no gallop and no friction rub.   No murmur heard. Pulmonary/Chest: Effort normal and breath sounds normal. No respiratory distress. He has no wheezes. He has no rales.  Musculoskeletal: He exhibits no edema.  L. Knee with some pain with full extension.  No instability  Lymphadenopathy:    He has no cervical adenopathy.  Neurological: He is alert and oriented to person, place, and time.  Psychiatric:  Affect somewhat flat.  Vitals reviewed.      No results found for this or any previous visit (from the past 2160 hour(s)).   Assessment & Plan  Problem List Items Addressed This Visit      Musculoskeletal and Integument   Arthritis   Relevant Medications   predniSONE (DELTASONE) 10 MG tablet   meloxicam (MOBIC) 15 MG tablet     Other   Depression - Primary   Relevant Medications   DULoxetine (CYMBALTA) 30 MG capsule   Bronchospasm   Relevant Medications   predniSONE (DELTASONE) 10 MG tablet      Meds ordered this encounter  Medications  . doxycycline (VIBRAMYCIN) 100 MG capsule    Sig: Take 100 mg by mouth 2 (two) times daily.    Refill:  0  . benzonatate (TESSALON) 200 MG capsule    Sig: Take 200 mg by mouth as directed.    Refill:  0  . PROAIR HFA 108 (90 Base) MCG/ACT inhaler    Sig: Inhale 1 puff into the lungs as directed.    Refill:  0  . predniSONE (DELTASONE) 10 MG tablet    Sig: Take 3 tablets daily for 2 days, 2 tablets daily for 2 days, then 1 tablet daily for 2 days (3, 3, 2, 2, 1, 1.)    Dispense:  12 tablet    Refill:  0  . meloxicam (MOBIC) 15 MG tablet    Sig: Take 1  tablet (15 mg total) by mouth daily.    Dispense:  30 tablet    Refill:  6  . DULoxetine (CYMBALTA) 30 MG capsule    Sig: Take 1 capsule (30 mg total) by mouth daily.    Dispense:  30 capsule    Refill:  3   1. Depression, unspecified depression type  - DULoxetine (CYMBALTA) 30 MG capsule; Take 1 capsule (30 mg total) by mouth  daily.  Dispense: 30 capsule; Refill: 3  2. Arthritis  - meloxicam (MOBIC) 15 MG tablet; Take 1 tablet (15 mg total) by mouth daily.  Dispense: 30 tablet; Refill: 6  3. Bronchospasm  - predniSONE (DELTASONE) 10 MG tablet; Take 3 tablets daily for 2 days, 2 tablets daily for 2 days, then 1 tablet daily for 2 days (3, 3, 2, 2, 1, 1.)  Dispense: 12 tablet; Refill: 0

## 2016-03-18 ENCOUNTER — Other Ambulatory Visit: Payer: Self-pay | Admitting: Family Medicine

## 2016-03-18 DIAGNOSIS — G4700 Insomnia, unspecified: Secondary | ICD-10-CM

## 2016-03-18 NOTE — Telephone Encounter (Signed)
Patient has called requesting a refill.  He can not wait until Monday when Dr. Luan Pulling is back in office due traveling.

## 2016-03-21 ENCOUNTER — Ambulatory Visit: Payer: BLUE CROSS/BLUE SHIELD | Admitting: Family Medicine

## 2016-03-28 ENCOUNTER — Ambulatory Visit: Payer: BLUE CROSS/BLUE SHIELD | Admitting: Family Medicine

## 2016-04-05 ENCOUNTER — Ambulatory Visit (INDEPENDENT_AMBULATORY_CARE_PROVIDER_SITE_OTHER): Payer: BLUE CROSS/BLUE SHIELD | Admitting: Family Medicine

## 2016-04-05 ENCOUNTER — Encounter: Payer: Self-pay | Admitting: Family Medicine

## 2016-04-05 ENCOUNTER — Encounter: Payer: Self-pay | Admitting: *Deleted

## 2016-04-05 VITALS — BP 120/72 | HR 76 | Temp 98.1°F | Resp 16 | Ht 72.0 in | Wt 188.0 lb

## 2016-04-05 DIAGNOSIS — M779 Enthesopathy, unspecified: Secondary | ICD-10-CM

## 2016-04-05 DIAGNOSIS — M1990 Unspecified osteoarthritis, unspecified site: Secondary | ICD-10-CM | POA: Diagnosis not present

## 2016-04-05 DIAGNOSIS — F329 Major depressive disorder, single episode, unspecified: Secondary | ICD-10-CM

## 2016-04-05 DIAGNOSIS — F32A Depression, unspecified: Secondary | ICD-10-CM

## 2016-04-05 DIAGNOSIS — F5103 Paradoxical insomnia: Secondary | ICD-10-CM

## 2016-04-05 DIAGNOSIS — F5101 Primary insomnia: Secondary | ICD-10-CM | POA: Diagnosis not present

## 2016-04-05 MED ORDER — TRAZODONE HCL 50 MG PO TABS: 50.0000 mg | ORAL_TABLET | Freq: Every day | ORAL | 3 refills | Status: DC

## 2016-04-05 MED ORDER — MELOXICAM 15 MG PO TABS: 15.0000 mg | ORAL_TABLET | Freq: Every day | ORAL | 12 refills | Status: DC

## 2016-04-05 MED ORDER — DULOXETINE HCL 30 MG PO CPEP: 30.0000 mg | ORAL_CAPSULE | Freq: Every day | ORAL | 3 refills | Status: DC

## 2016-04-05 MED ORDER — HYDROXYZINE HCL 25 MG PO TABS: ORAL_TABLET | ORAL | 3 refills | Status: DC

## 2016-04-05 NOTE — Progress Notes (Signed)
Name: Tony Franco   MRN: [id number]    DOB: July 23, 1950   Date:04/05/2016       Progress Note  Subjective  Chief Complaint  Chief Complaint  Patient presents with  . Depression    HPI Here for f/u of depression and insomnia and arthritis.  His depression score is up to 7/10 at present .  Sleeps well with Atarax.  Energy is +/-. Joints doing pretty well except for soreness in knees at times.  No problem-specific Assessment & Plan notes found for this encounter.   Past Medical History:  Diagnosis Date  . Hypertension     Past Surgical History:  Procedure Laterality Date  . COLONOSCOPY WITH PROPOFOL N/A 04/28/2015   Procedure: COLONOSCOPY WITH PROPOFOL;  Surgeon: Christene Lye, MD;  Location: [facility] ENDOSCOPY;  Service: Endoscopy;  Laterality: N/A;  . KNEE ARTHROSCOPY     left knee    Family History  Problem Relation Age of Onset  . Heart attack Mother   . Heart attack Father     Social History   Social History  . Marital status: Single    Spouse name: N/A  . Number of children: N/A  . Years of education: N/A   Occupational History  . Not on file.   Social History Main Topics  . Smoking status: Never Smoker  . Smokeless tobacco: Never Used  . Alcohol use 0.6 oz/week    1 Cans of beer per week  . Drug use: No  . Sexual activity: Not on file   Other Topics Concern  . Not on file   Social History Narrative  . No narrative on file     Current Outpatient Prescriptions:  .  DULoxetine (CYMBALTA) 30 MG capsule, Take 1 capsule (30 mg total) by mouth daily., Disp: 90 capsule, Rfl: 3 .  hydrOXYzine (ATARAX/VISTARIL) 25 MG tablet, Take 1 cap twice a day and 1 or 2 at bedtime, Disp: 90 tablet, Rfl: 3 .  meloxicam (MOBIC) 15 MG tablet, Take 1 tablet (15 mg total) by mouth daily., Disp: 30 tablet, Rfl: 12 .  PROAIR HFA 108 (90 Base) MCG/ACT inhaler, Inhale 1 puff into the lungs as directed., Disp: , Rfl: 0 .  traZODone (DESYREL) 50 MG tablet, Take 1 tablet (50  mg total) by mouth at bedtime., Disp: 90 tablet, Rfl: 3  Not on File   Review of Systems  Constitutional: Negative for chills, fever, malaise/fatigue and weight loss.  HENT: Negative for hearing loss and tinnitus.   Eyes: Negative for blurred vision and double vision.  Respiratory: Negative for cough, shortness of breath and wheezing.   Cardiovascular: Negative for chest pain, palpitations and leg swelling.  Gastrointestinal: Negative for abdominal pain, blood in stool and heartburn.  Genitourinary: Negative for dysuria, frequency and urgency.  Musculoskeletal: Positive for joint pain (knees ande antecubital fossae). Negative for myalgias.  Skin: Negative for rash.  Neurological: Negative for dizziness, tingling, tremors, weakness and headaches.      Objective  Vitals:   04/05/16 1431  BP: 120/72  Pulse: 76  Resp: 16  Temp: 98.1 F (36.7 C)  TempSrc: Oral  Weight: 188 lb (85.3 kg)  Height: 6' (1.829 m)    Physical Exam  Constitutional: He is oriented to person, place, and time and well-developed, well-nourished, and in no distress. No distress.  HENT:  Head: Normocephalic and atraumatic.  Eyes: Conjunctivae and EOM are normal. Pupils are equal, round, and reactive to light. No scleral icterus.  Neck:  Normal range of motion. Neck supple. Carotid bruit is not present. No thyromegaly present.  Cardiovascular: Normal rate, regular rhythm and normal heart sounds.  Exam reveals no gallop and no friction rub.   No murmur heard. Pulmonary/Chest: Effort normal and breath sounds normal. No respiratory distress. He has no wheezes. He has no rales.  Abdominal: Soft. Bowel sounds are normal. He exhibits no distension and no mass. There is no tenderness.  Musculoskeletal: He exhibits no edema.  Lymphadenopathy:    He has no cervical adenopathy.  Neurological: He is alert and oriented to person, place, and time.  Vitals reviewed.      No results found for this or any previous  visit (from the past 2160 hour(s)).   Assessment & Plan  Problem List Items Addressed This Visit      Musculoskeletal and Integument   Tendonitis   Arthritis   Relevant Medications   meloxicam (MOBIC) 15 MG tablet     Other   Depression - Primary   Relevant Medications   DULoxetine (CYMBALTA) 30 MG capsule   traZODone (DESYREL) 50 MG tablet   hydrOXYzine (ATARAX/VISTARIL) 25 MG tablet   Insomnia   Relevant Medications   traZODone (DESYREL) 50 MG tablet   hydrOXYzine (ATARAX/VISTARIL) 25 MG tablet      Meds ordered this encounter  Medications  . DULoxetine (CYMBALTA) 30 MG capsule    Sig: Take 1 capsule (30 mg total) by mouth daily.    Dispense:  90 capsule    Refill:  3  . meloxicam (MOBIC) 15 MG tablet    Sig: Take 1 tablet (15 mg total) by mouth daily.    Dispense:  30 tablet    Refill:  12  . traZODone (DESYREL) 50 MG tablet    Sig: Take 1 tablet (50 mg total) by mouth at bedtime.    Dispense:  90 tablet    Refill:  3    Please consider 90 day supplies to promote better adherence  . hydrOXYzine (ATARAX/VISTARIL) 25 MG tablet    Sig: Take 1 cap twice a day and 1 or 2 at bedtime    Dispense:  90 tablet    Refill:  3   1. Depression, unspecified depression type  - DULoxetine (CYMBALTA) 30 MG capsule; Take 1 capsule (30 mg total) by mouth daily.  Dispense: 90 capsule; Refill: 3  2. Primary insomnia  - traZODone (DESYREL) 50 MG tablet; Take 1 tablet (50 mg total) by mouth at bedtime.  Dispense: 90 tablet; Refill: 3  3. Tendonitis   4. Arthritis  - meloxicam (MOBIC) 15 MG tablet; Take 1 tablet (15 mg total) by mouth daily.  Dispense: 30 tablet; Refill: 12  5. Paradoxical insomnia  - hydrOXYzine (ATARAX/VISTARIL) 25 MG tablet; Take 1 cap twice a day and 1 or 2 at bedtime  Dispense: 90 tablet; Refill: 3

## 2016-04-19 DIAGNOSIS — F320 Major depressive disorder, single episode, mild: Secondary | ICD-10-CM | POA: Diagnosis not present

## 2016-05-02 DIAGNOSIS — F320 Major depressive disorder, single episode, mild: Secondary | ICD-10-CM | POA: Diagnosis not present

## 2016-08-04 ENCOUNTER — Ambulatory Visit (INDEPENDENT_AMBULATORY_CARE_PROVIDER_SITE_OTHER): Payer: BLUE CROSS/BLUE SHIELD | Admitting: Family Medicine

## 2016-08-04 ENCOUNTER — Encounter: Payer: Self-pay | Admitting: Family Medicine

## 2016-08-04 VITALS — BP 110/63 | HR 89 | Ht 72.0 in | Wt 179.2 lb

## 2016-08-04 DIAGNOSIS — Z23 Encounter for immunization: Secondary | ICD-10-CM | POA: Diagnosis not present

## 2016-08-04 DIAGNOSIS — M7711 Lateral epicondylitis, right elbow: Secondary | ICD-10-CM | POA: Insufficient documentation

## 2016-08-04 DIAGNOSIS — F330 Major depressive disorder, recurrent, mild: Secondary | ICD-10-CM

## 2016-08-04 DIAGNOSIS — Z125 Encounter for screening for malignant neoplasm of prostate: Secondary | ICD-10-CM | POA: Diagnosis not present

## 2016-08-04 DIAGNOSIS — R799 Abnormal finding of blood chemistry, unspecified: Secondary | ICD-10-CM | POA: Diagnosis not present

## 2016-08-04 DIAGNOSIS — Z0000 Encounter for general adult medical examination without abnormal findings: Secondary | ICD-10-CM

## 2016-08-04 DIAGNOSIS — M170 Bilateral primary osteoarthritis of knee: Secondary | ICD-10-CM | POA: Diagnosis not present
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
## 2016-08-04 NOTE — Assessment & Plan Note (Signed)
Average risk patient, with prior reported negative screening - Last PSA unknown, prior values normal - Clinically asymptomatic  Plan: 1. Reviewed prostate cancer screening guidelines and risks including potential prostate biopsy if abnormal PSA, proceed with yearly PSA for now, and anticipate DRE as needed especially if abnormal PSA or new symptoms

## 2016-08-04 NOTE — Patient Instructions (Addendum)
Thank you for coming to the clinic today.  1. Cleared to participate, form completed.  2. TDap tetanus shot today, good for 10 years  Right Tennis Elbow, Lateral Epicondylitis  Recommend trial of Anti-inflammatory with Meloxicam 15mg  daily with food for now for several days. Use this up to 1-2 weeks at a time as needed  - DO NOT TAKE any ibuprofen, advil, motrin, aleve while you are taking this medicine - It is safe to take Tylenol Ext Str 500mg  tabs - take 1 to 2 (max dose 1000mg ) every 6 hours as needed for breakthrough pain, max 24 hour daily dose is 6 to 8 tablets or 4000mg   Use RICE therapy: - R - Rest / relative rest with activity modification avoid overuse of joint - I - Ice packs (make sure you use a towel or sock / something to protect skin) - C - Compression with strap you are using currently, or elbow sleeve, ACE wrap to apply pressure and reduce swelling allowing more support - E - Elevation - if significant swelling, lift leg above heart level (toes above your nose) to help reduce swelling, most helpful at night after day of being on your feet  You will be due for FASTING BLOOD WORK (no food or drink after midnight before, only water or coffee without cream/sugar on the morning of)  [facility]  Please schedule a Follow-up Appointment to: Return in about 6 months (around 02/03/2017) for Depression PHQ / Insomnia / Arthritis.  If you have any other questions or concerns, please feel free to call the clinic or send a message through [place]. You may also schedule an earlier appointment if necessary.  Additionally, you may be receiving a survey about your experience at our clinic within a few days to 1 week by e-mail or mail. We value your feedback.  [name], DO [place]              See other page with pictures of each exercise.  Start with 1 or 2 of these exercises that you are most comfortable with. Do not do  any exercises that cause you significant worsening pain. Some of these may cause some "stretching soreness" but it should go away after you stop the exercise, and get better over time. Gradually increase up to 3-4 exercises as tolerated.  You may do the stretching exercises right away. You may do the strengthening exercises when stretching is nearly painless.  Stretching exercises Wrist range of motion: Bend your wrist forward and backward as far as you can. Do 3 sets of 10. Pronation and supination of the forearm: With your elbow bent 90, turn your palm upward and hold for 5 seconds. Slowly turn your palm downward and hold for 5 seconds. Make sure you keep your elbow at your side and bent 90 throughout this exercise. Do 3 sets of 10. Elbow range of motion: Gently bring your palm up toward your shoulder and bend your elbow as far as you can. Then straighten your elbow as far as you can 10 times. Do 3  sets of 10.  Strengthening exercises Wrist flexion exercise: Hold a can or hammer handle in your hand with your palm facing up. Bend your wrist upward. Slowly lower the weight and return to the starting position. Do 3 sets of 10. Gradually increase the weight of the can or weight you are holding. Wrist extension exercise: Hold a soup can or hammer handle in your hand with  your palm facing down. Slowly bend your wrist upward. Slowly lower the weight down into the starting position. Do 3 sets of 10. Gradually increase the weight of the object you are holding. Wrist radial deviation strengthening: Put your wrist in the sideways position with your thumb up. Hold a can of soup or a hammer handle and gently bend your wrist up, with the thumb reaching toward the ceiling. Slowly lower to the starting position. Do not move your forearm throughout this exercise. Do 3 sets of 10. Forearm pronation and supination strengthening: Hold a soup can or hammer handle in your hand and bend your elbow 90. Slowly rotate  your hand with your palm upward and then palm down. Do 3 sets of 10. Wrist extension (with broom handle): Stand up and hold a broom handle in both hands. With your arms at shoulder level, elbows straight and palms down, roll the broom handle backward in your hand as if you are reeling something in using a broom handle. Do 3 sets of 10.

## 2016-08-04 NOTE — Assessment & Plan Note (Signed)
Stable without complication No X-rays to review, suspected mild arthritis Cleared to participate in [employer] Follow-up PRN

## 2016-08-04 NOTE — Assessment & Plan Note (Signed)
Stable, currently controlled on medications, overall improved with less life stressors Concern with some chronic passive suicidal thoughts, less frequent, no active plan or ideation, he feels safe and has resources, has therapist in [place] - Complicated by insomnia, now improved  Plan: 1. Continue current Duloxetine 30mg  daily, Trazodone 50mg  at night 2. Continue therapy / counseling 3. Follow-up q 6 mo

## 2016-08-04 NOTE — Assessment & Plan Note (Signed)
Stable, mild without flare No complication For flare, start existing Meloxicam 15mg  daily x 2-4 weeks then PRN, Tylenol, RICE therapy, rehab exercises per handout, may use forearm strap

## 2016-08-15 DIAGNOSIS — F320 Major depressive disorder, single episode, mild: Secondary | ICD-10-CM | POA: Diagnosis not present

## 2016-12-15 ENCOUNTER — Telehealth: Payer: Self-pay | Admitting: Family Medicine

## 2016-12-15 DIAGNOSIS — F32A Depression, unspecified: Secondary | ICD-10-CM

## 2016-12-15 DIAGNOSIS — F329 Major depressive disorder, single episode, unspecified: Secondary | ICD-10-CM

## 2016-12-15 DIAGNOSIS — F5101 Primary insomnia: Secondary | ICD-10-CM

## 2016-12-15 DIAGNOSIS — F5103 Paradoxical insomnia: Secondary | ICD-10-CM

## 2016-12-15 DIAGNOSIS — M1990 Unspecified osteoarthritis, unspecified site: Secondary | ICD-10-CM

## 2016-12-15 MED ORDER — MELOXICAM 15 MG PO TABS: 15.0000 mg | ORAL_TABLET | Freq: Every day | ORAL | 5 refills | Status: DC

## 2016-12-15 MED ORDER — TRAZODONE HCL 50 MG PO TABS: 50.0000 mg | ORAL_TABLET | Freq: Every day | ORAL | 1 refills | Status: DC

## 2016-12-15 MED ORDER — HYDROXYZINE HCL 25 MG PO TABS: ORAL_TABLET | ORAL | 1 refills | Status: DC

## 2016-12-15 MED ORDER — DULOXETINE HCL 30 MG PO CPEP: 30.0000 mg | ORAL_CAPSULE | Freq: Every day | ORAL | 1 refills | Status: DC

## 2016-12-15 NOTE — Telephone Encounter (Signed)
Refilled meds 6 month supply will follow-up in 01/2017  [name], [facility] 12/15/2016, 12:23 PM

## 2016-12-15 NOTE — Telephone Encounter (Signed)
Pt needs refills on trazodone, meloxicam, hydroxyzine and duloxetine sent to [facility].  He has appt scheduled 12/28.  Call back [phone]

## 2016-12-19 ENCOUNTER — Other Ambulatory Visit: Payer: Self-pay

## 2017-02-03 ENCOUNTER — Ambulatory Visit: Payer: BLUE CROSS/BLUE SHIELD | Admitting: Family Medicine

## 2017-02-09 ENCOUNTER — Ambulatory Visit (INDEPENDENT_AMBULATORY_CARE_PROVIDER_SITE_OTHER): Payer: BLUE CROSS/BLUE SHIELD | Admitting: Family Medicine

## 2017-02-09 ENCOUNTER — Encounter: Payer: Self-pay | Admitting: Family Medicine

## 2017-02-09 VITALS — BP 122/67 | HR 70 | Temp 98.0°F | Resp 16 | Ht 72.0 in | Wt 187.0 lb

## 2017-02-09 DIAGNOSIS — Z23 Encounter for immunization: Secondary | ICD-10-CM | POA: Diagnosis not present

## 2017-02-09 DIAGNOSIS — N522 Drug-induced erectile dysfunction: Secondary | ICD-10-CM

## 2017-02-09 DIAGNOSIS — M170 Bilateral primary osteoarthritis of knee: Secondary | ICD-10-CM

## 2017-02-09 DIAGNOSIS — F5101 Primary insomnia: Secondary | ICD-10-CM | POA: Diagnosis not present

## 2017-02-09 DIAGNOSIS — F330 Major depressive disorder, recurrent, mild: Secondary | ICD-10-CM

## 2017-02-09 DIAGNOSIS — F5103 Paradoxical insomnia: Secondary | ICD-10-CM | POA: Diagnosis not present

## 2017-02-09 DIAGNOSIS — M1990 Unspecified osteoarthritis, unspecified site: Secondary | ICD-10-CM | POA: Diagnosis not present

## 2017-02-09 MED ORDER — HYDROXYZINE HCL 25 MG PO TABS: 25.0000 mg | ORAL_TABLET | Freq: Every day | ORAL | 3 refills | Status: DC

## 2017-02-09 MED ORDER — MELOXICAM 15 MG PO TABS: 15.0000 mg | ORAL_TABLET | Freq: Every day | ORAL | 3 refills | Status: DC

## 2017-02-09 MED ORDER — DULOXETINE HCL 30 MG PO CPEP: 30.0000 mg | ORAL_CAPSULE | Freq: Every day | ORAL | 3 refills | Status: DC

## 2017-02-09 MED ORDER — TRAZODONE HCL 50 MG PO TABS: 50.0000 mg | ORAL_TABLET | Freq: Every day | ORAL | 3 refills | Status: DC

## 2017-02-09 MED ORDER — SILDENAFIL CITRATE 20 MG PO TABS: ORAL_TABLET | ORAL | 3 refills | Status: DC

## 2017-02-09 NOTE — Assessment & Plan Note (Signed)
Stable without complication Continue Meloxicam Follow-up PRN

## 2017-02-09 NOTE — Assessment & Plan Note (Signed)
Consistent with secondary ED due to SNRI side effect - without other risk factors, no evidence of vascular or neurogenic disease, normal blood sugar previously  Plan: 1. Trial on generic Sildenafil 20mg  tabs, take 1-5 tabs about 30 min prior to sexual activity, refills provided 2. Follow-up as needed

## 2017-02-09 NOTE — Assessment & Plan Note (Signed)
Stable, secondary to depression and possible some primary insomnia Less likely related to nocturia Controlled on SNRI and Trazodone, Hydroxyzine

## 2017-02-09 NOTE — Patient Instructions (Addendum)
Thank you for coming to the office today.  1. Keep up the great work overall! No med changes, refilled all meds 90 days, make sure pharmacy gives you 90 day supply and not using old rx  2.  Discount generic Sildenafil pills $1 per [name]   Address: [address], [place], [place] [address] Hours: 8:30AM-6:30PM Phone: [phone]  3.  [facility] - DUE for FASTING BLOOD WORK (no food or drink after midnight before the lab appointment, only water or coffee without cream/sugar on the morning of)  Call us 1 month before apt for [facility] [place] [facility]  Please schedule a Follow-up Appointment to: Return in about 5 months (around 07/17/2017) for Annual Physical.    If you have any other questions or concerns, please feel free to call the office or send a message through [name]. You may also schedule an earlier appointment if necessary.  Additionally, you may be receiving a survey about your experience at our office within a few days to 1 week by e-mail or mail. We value your feedback.  [name], DO [place]

## 2017-02-09 NOTE — Progress Notes (Signed)
Subjective:    Patient ID: Tony Franco, male    DOB: Nov 22, 1950, 67 y.o.   MRN: [id number]  Tony Franco is a 67 y.o. male presenting on 02/09/2017 for Depression   HPI   FOLLOW-UP Major Depression / Insomnia - Last visit with me 08/04/16, for initial visit with me for same problem, treated with continued Duloxetine and Trazodone, see prior notes for background information. - Today patient reports doing well, improved mood, see PHQ - Requesting refills on both meds, thinks they are working great - Followed by Psychology counseling in [place] - Taking Duloxetine 30mg  daily and Trazodone 50mg  at night - Taking Hydroxyzine x 2 tabs nightly for insomnia - Admits problem with sleep onset and maintenance, worse over past 2.5 years, now improved. Blue light at night helps sleep as well. - Admits some nocturia about 1x overnight, usually still able to fall back asleep - Denies anxiety  Erectile Dysfunction, secondary to med side effect - Reports he has ED due to side effect of SNRI duloxetine. Has tried Viagra in past, requesting generic rx Sildenafil - Denies dysuria, discharge  FOLLOW-UP OA/DJD, knee pain - Reports doing well on NSAID Meloxicam takes regularly, requesting refill - Denies new joint pain, swelling, injury trauma  Health Maintenance: Due for Flu Shot, will receive today   Depression screen [facility] 2/9 02/09/2017 08/04/2016 04/05/2016  Decreased Interest 0 0 0  Down, Depressed, Hopeless 0 1 -  PHQ - 2 Score 0 1 0  Altered sleeping 2 2 -  Tired, decreased energy 2 2 -  Change in appetite 0 0 -  Feeling bad or failure about yourself  0 0 -  Trouble concentrating 0 0 -  Moving slowly or fidgety/restless 0 0 -  Suicidal thoughts 0 1 -  PHQ-9 Score 4 6 -  Difficult doing work/chores Not difficult at all - -    Social History   Tobacco Use  . Smoking status: Never Smoker  . Smokeless tobacco: Never Used  Substance Use Topics  . Alcohol use: Yes    Alcohol/week: 0.6  oz    Types: 1 Cans of beer per week  . Drug use: No    Review of Systems  Constitutional: Negative for activity change, appetite change, chills, diaphoresis, fatigue and fever.  HENT: Negative for congestion and hearing loss.   Eyes: Negative for visual disturbance.  Respiratory: Negative for cough, chest tightness and shortness of breath.   Cardiovascular: Negative for chest pain, palpitations and leg swelling.  Gastrointestinal: Negative for abdominal pain, constipation, diarrhea, nausea and vomiting.  Genitourinary: Negative for dysuria, frequency and hematuria.  Skin: Negative for rash.  Neurological: Negative for dizziness, weakness, light-headedness, numbness and headaches.   Per HPI unless specifically indicated above     Objective:    BP 122/67   Pulse 70   Temp 98 F (36.7 C) (Oral)   Resp 16   Ht 6' (1.829 m)   Wt 187 lb (84.8 kg)   BMI 25.36 kg/m   Wt Readings from Last 3 Encounters:  02/09/17 187 lb (84.8 kg)  08/04/16 179 lb 3.2 oz (81.3 kg)  04/05/16 188 lb (85.3 kg)    Physical Exam  Constitutional: He is oriented to person, place, and time. He appears well-developed and well-nourished. No distress.  Well-appearing, comfortable, cooperative  HENT:  Head: Normocephalic and atraumatic.  Mouth/Throat: Oropharynx is clear and moist.  Eyes: Conjunctivae are normal. Right eye exhibits no discharge. Left eye exhibits no discharge.  Neck: Normal range of motion. Neck supple. No thyromegaly present.  Cardiovascular: Normal rate, regular rhythm, normal heart sounds and intact distal pulses.  No murmur heard. Pulmonary/Chest: Effort normal and breath sounds normal. No respiratory distress. He has no wheezes. He has no rales.  Musculoskeletal: Normal range of motion. He exhibits no edema.  Lymphadenopathy:    He has no cervical adenopathy.  Neurological: He is alert and oriented to person, place, and time.  Skin: Skin is warm and dry. No rash noted. He is not  diaphoretic. No erythema.  Psychiatric: He has a normal mood and affect. His behavior is normal.  Well groomed, good eye contact, normal speech and thoughts  Nursing note and vitals reviewed.  Results for orders placed or performed in visit on 08/04/16  HM HIV SCREENING LAB  Result Value Ref Range   HM HIV Screening Negative - Validated   HM HEPATITIS C SCREENING LAB  Result Value Ref Range   HM Hepatitis Screen Negative-Validated       Assessment & Plan:   Problem List Items Addressed This Visit    ED (erectile dysfunction)    Consistent with secondary ED due to SNRI side effect - without other risk factors, no evidence of vascular or neurogenic disease, normal blood sugar previously  Plan: 1. Trial on generic Sildenafil 20mg  tabs, take 1-5 tabs about 30 min prior to sexual activity, refills provided 2. Follow-up as needed      Relevant Medications   sildenafil (REVATIO) 20 MG tablet   Insomnia    Stable, secondary to depression and possible some primary insomnia Less likely related to nocturia Controlled on SNRI and Trazodone, Hydroxyzine      Relevant Medications   hydrOXYzine (ATARAX/VISTARIL) 25 MG tablet   traZODone (DESYREL) 50 MG tablet   Mild episode of recurrent major depressive disorder ([place]) - Primary    Stable, currently controlled on medications Remains improved with less life stressors Reduced passive suicidal thoughts, has therapist in [place] - Complicated by insomnia, now improved  Plan: 1. Continue current Duloxetine 30mg  daily, Trazodone 50mg  at night - Continue Hydroxyzine 1-2 tabs nightly 2. Continue therapy / counseling 3. Follow-up q 5-6 mo - next visit annual physical + labs      Relevant Medications   hydrOXYzine (ATARAX/VISTARIL) 25 MG tablet   DULoxetine (CYMBALTA) 30 MG capsule   traZODone (DESYREL) 50 MG tablet   Osteoarthritis of knees, bilateral    Stable without complication Continue Meloxicam Follow-up PRN      Relevant  Medications   meloxicam (MOBIC) 15 MG tablet    Other Visit Diagnoses    Arthritis       Relevant Medications   meloxicam (MOBIC) 15 MG tablet   Needs flu shot       Relevant Orders   Flu Vaccine QUAD 36+ mos IM (Completed)      Meds ordered this encounter  Medications  . hydrOXYzine (ATARAX/VISTARIL) 25 MG tablet    Sig: Take 1-2 tablets (25-50 mg total) by mouth at bedtime.    Dispense:  90 tablet    Refill:  3  . meloxicam (MOBIC) 15 MG tablet    Sig: Take 1 tablet (15 mg total) by mouth daily.    Dispense:  90 tablet    Refill:  3  . DULoxetine (CYMBALTA) 30 MG capsule    Sig: Take 1 capsule (30 mg total) by mouth daily.    Dispense:  90 capsule    Refill:  3  .  traZODone (DESYREL) 50 MG tablet    Sig: Take 1 tablet (50 mg total) by mouth at bedtime.    Dispense:  90 tablet    Refill:  3  . sildenafil (REVATIO) 20 MG tablet    Sig: Take 1-5 pills about 30 min prior to sex. Start with 1 and increase as needed.    Dispense:  30 tablet    Refill:  3     Follow up plan: Return in about 5 months (around 07/17/2017) for Annual Physical.  Will place future orders for [facility] when requested in approximately 3-4 months. Will print orders and make available for pick up at our office for patient to get drawn before next apt, since he never had them drawn last time and orders expired.  [name], [facility] 02/09/2017, 4:06 PM

## 2017-02-09 NOTE — Assessment & Plan Note (Signed)
Stable, currently controlled on medications Remains improved with less life stressors Reduced passive suicidal thoughts, has therapist in [place] - Complicated by insomnia, now improved  Plan: 1. Continue current Duloxetine 30mg  daily, Trazodone 50mg  at night - Continue Hydroxyzine 1-2 tabs nightly 2. Continue therapy / counseling 3. Follow-up q 5-6 mo - next visit annual physical + labs

## 2017-03-05 ENCOUNTER — Encounter: Payer: Self-pay | Admitting: Emergency Medicine

## 2017-03-05 ENCOUNTER — Other Ambulatory Visit: Payer: Self-pay

## 2017-03-05 ENCOUNTER — Emergency Department
Admission: EM | Admit: 2017-03-05 | Discharge: 2017-03-05 | Disposition: A | Discharge status: Home / Self Care | Payer: BLUE CROSS/BLUE SHIELD | Attending: Student in an Organized Health Care Education/Training Program | Admitting: Student in an Organized Health Care Education/Training Program

## 2017-03-05 ENCOUNTER — Emergency Department: Payer: BLUE CROSS/BLUE SHIELD

## 2017-03-05 DIAGNOSIS — S61211A Laceration without foreign body of left index finger without damage to nail, initial encounter: Secondary | ICD-10-CM | POA: Insufficient documentation

## 2017-03-05 DIAGNOSIS — Y9389 Activity, other specified: Secondary | ICD-10-CM | POA: Diagnosis not present

## 2017-03-05 DIAGNOSIS — S63259A Unspecified dislocation of unspecified finger, initial encounter: Secondary | ICD-10-CM

## 2017-03-05 DIAGNOSIS — W268XXA Contact with other sharp object(s), not elsewhere classified, initial encounter: Secondary | ICD-10-CM | POA: Insufficient documentation

## 2017-03-05 DIAGNOSIS — Z79899 Other long term (current) drug therapy: Secondary | ICD-10-CM | POA: Diagnosis not present

## 2017-03-05 DIAGNOSIS — Y929 Unspecified place or not applicable: Secondary | ICD-10-CM | POA: Insufficient documentation

## 2017-03-05 DIAGNOSIS — S62631A Displaced fracture of distal phalanx of left index finger, initial encounter for closed fracture: Secondary | ICD-10-CM | POA: Insufficient documentation

## 2017-03-05 DIAGNOSIS — S62639A Displaced fracture of distal phalanx of unspecified finger, initial encounter for closed fracture: Secondary | ICD-10-CM

## 2017-03-05 DIAGNOSIS — Y999 Unspecified external cause status: Secondary | ICD-10-CM | POA: Insufficient documentation

## 2017-03-05 DIAGNOSIS — S6992XA Unspecified injury of left wrist, hand and finger(s), initial encounter: Secondary | ICD-10-CM | POA: Diagnosis not present

## 2017-03-05 DIAGNOSIS — S63291A Dislocation of distal interphalangeal joint of left index finger, initial encounter: Secondary | ICD-10-CM | POA: Diagnosis not present

## 2017-03-05 MED ORDER — HYDROCODONE-ACETAMINOPHEN 5-325 MG PO TABS: 1.0000 | ORAL_TABLET | ORAL | 0 refills | Status: DC | PRN

## 2017-03-05 MED ORDER — CEPHALEXIN 500 MG PO CAPS: 500.0000 mg | ORAL_CAPSULE | Freq: Three times a day (TID) | ORAL | 0 refills | Status: AC

## 2017-03-05 MED ORDER — MORPHINE SULFATE (PF) 4 MG/ML IV SOLN
4.0000 mg | INTRAVENOUS | Status: DC | PRN
  Administered 2017-03-05: 4 mg via INTRAMUSCULAR
  Filled 2017-03-05: qty 1

## 2017-03-05 MED ORDER — BUPIVACAINE HCL (PF) 0.5 % IJ SOLN
30.0000 mL | Freq: Once | INTRAMUSCULAR | Status: AC
  Administered 2017-03-05: 30 mL
  Filled 2017-03-05: qty 30

## 2017-03-05 NOTE — ED Notes (Signed)

## 2017-03-05 NOTE — ED Triage Notes (Signed)
Pt to ED via POV c/o finger injury. Pt states that he was working with a piece of wood this morning and pushed the wood into the saw and it cut his left index finger. Pt has obvious deformity to finger with mild bleeding. Pt anxious and crying at this time.

## 2017-03-05 NOTE — ED Provider Notes (Signed)
[facility] Emergency Department Provider Note    First MD Initiated Contact with Patient 03/05/17 1058     (approximate)  I have reviewed the triage vital signs and the nursing notes.   HISTORY  Chief Complaint Finger Injury    HPI Tony Franco is a 67 y.o. male no recent hospitalizations presents with acute left index finger pain and deformity that occurred this morning while the patient was working on a table saw cutting plywood flooring.  States he lost control and his finger hit the blade.  He is right-hand dominant.  States the pain is moderate to severe.  His tetanus is up-to-date.  No other associated injuries.  History reviewed. No pertinent past medical history. Family History  Problem Relation Age of Onset  . Heart attack Mother   . Heart attack Father    Past Surgical History:  Procedure Laterality Date  . COLONOSCOPY WITH PROPOFOL N/A 04/28/2015   Procedure: COLONOSCOPY WITH PROPOFOL;  Surgeon: Christene Lye, MD;  Location: [facility] ENDOSCOPY;  Service: Endoscopy;  Laterality: N/A;  . KNEE ARTHROSCOPY     left knee   Patient Active Problem List   Diagnosis Date Noted  . Mild episode of recurrent major depressive disorder ([place]) 08/04/2016  . Right lateral epicondylitis 08/04/2016  . Screening for prostate cancer 08/04/2016  . Osteoarthritis of knees, bilateral 02/11/2016  . Tendonitis 12/14/2015  . ED (erectile dysfunction) 04/20/2015  . Insomnia 04/20/2015  . Paresthesia 03/16/2015  . RLS (restless legs syndrome) 03/16/2015  . Colon cancer screening 03/16/2015      Prior to Admission medications   Medication Sig Start Date End Date Taking? Authorizing Provider  cephALEXin (KEFLEX) 500 MG capsule Take 1 capsule (500 mg total) by mouth 3 (three) times daily for 7 days. 03/05/17 03/12/17  Merlyn Lot, MD  DULoxetine (CYMBALTA) 30 MG capsule Take 1 capsule (30 mg total) by mouth daily. 02/09/17   Karamalegos, Devonne Doughty, DO    HYDROcodone-acetaminophen (NORCO) 5-325 MG tablet Take 1 tablet by mouth every 4 (four) hours as needed for moderate pain. 03/05/17   Merlyn Lot, MD  hydrOXYzine (ATARAX/VISTARIL) 25 MG tablet Take 1-2 tablets (25-50 mg total) by mouth at bedtime. 02/09/17   Karamalegos, Devonne Doughty, DO  meloxicam (MOBIC) 15 MG tablet Take 1 tablet (15 mg total) by mouth daily. 02/09/17   Karamalegos, Devonne Doughty, DO  PROAIR HFA 108 (90 Base) MCG/ACT inhaler Inhale 1 puff into the lungs as directed. 02/02/16   [provider]  sildenafil (REVATIO) 20 MG tablet Take 1-5 pills about 30 min prior to sex. Start with 1 and increase as needed. 02/09/17   Karamalegos, Devonne Doughty, DO  traZODone (DESYREL) 50 MG tablet Take 1 tablet (50 mg total) by mouth at bedtime. 02/09/17   Olin Hauser, DO    Allergies Patient has no known allergies.    Social History Social History   Tobacco Use  . Smoking status: Never Smoker  . Smokeless tobacco: Never Used  Substance Use Topics  . Alcohol use: Yes    Alcohol/week: 0.6 oz    Types: 1 Cans of beer per week  . Drug use: No    Review of Systems Patient denies headaches, rhinorrhea, blurry vision, numbness, shortness of breath, chest pain, edema, cough, abdominal pain, nausea, vomiting, diarrhea, dysuria, fevers, rashes or hallucinations unless otherwise stated above in HPI. ____________________________________________   PHYSICAL EXAM:  VITAL SIGNS: Vitals:   03/05/17 1059  BP: (!) 152/91  Pulse: 78  Resp: 16  Temp: 97.8 F (36.6 C)  SpO2: 97%    Constitutional: Alert and oriented. Well appearing and in no acute distress. Eyes: Conjunctivae are normal.  Head: Atraumatic. Nose: No congestion/rhinnorhea. Mouth/Throat: Mucous membranes are moist.   Neck: No stridor. Painless ROM.  Cardiovascular: Normal rate, regular rhythm. Grossly normal heart sounds.  Good peripheral circulation. Respiratory: Normal respiratory effort.  No  retractions. Lungs CTAB. Gastrointestinal: Soft and nontender. No distention. No abdominal bruits. No CVA tenderness. Musculoskeletal: obvious radial deformity to left 2nd digit at PIP with 2cm laceration/avulsion injury to distal phalanx excluding the nail bed.  SILT distallyNo lower extremity tenderness nor edema.  No joint effusions. Neurologic:  Normal speech and language. No gross focal neurologic deficits are appreciated. No facial droop Skin:  Skin is warm, dry and intact. No rash noted. Psychiatric: Mood and affect are normal. Speech and behavior are normal.  ____________________________________________   LABS (all labs ordered are listed, but only abnormal results are displayed)  No results found for this or any previous visit (from the past 24 hour(s)). ____________________________________________ ____________________________________________  [id number]  I personally reviewed all radiographic images ordered to evaluate for the above acute complaints and reviewed radiology reports and findings.  These findings were personally discussed with the patient.  Please see medical record for radiology report.  ____________________________________________   PROCEDURES  Procedure(s) performed:  Marland KitchenMarland KitchenLaceration Repair Date/Time: 03/05/2017 12:50 PM Performed by: Merlyn Lot, MD Authorized by: Merlyn Lot, MD   Consent:    Consent obtained:  Verbal   Consent given by:  Patient   Risks discussed:  Infection, pain, retained foreign body, poor cosmetic result and poor wound healing Anesthesia (see MAR for exact dosages):    Anesthesia method:  Nerve block   Block anesthetic:  Bupivacaine 0.5% w/o epi   Block technique:  Ring   Block outcome:  Anesthesia achieved Laceration details:    Location:  Finger   Finger location:  L index finger   Length (cm):  3   Depth (mm):  5 Repair type:    Repair type:  Intermediate Exploration:    Hemostasis achieved with:  Direct  pressure   Wound exploration: entire depth of wound probed and visualized     Wound extent: underlying fracture     Contaminated: no   Treatment:    Area cleansed with:  Saline and Hibiclens   Amount of cleaning:  Extensive   Irrigation solution:  Sterile saline   Irrigation method:  Pressure wash   Visualized foreign bodies/material removed: no   Skin repair:    Repair method:  Sutures   Suture size:  5-0   Suture technique:  Simple interrupted   Number of sutures:  12 Approximation:    Approximation:  Loose Post-procedure details:    Dressing:  Sterile dressing and non-adherent dressing   Patient tolerance of procedure:  Tolerated well, no immediate complications ORTHOPEDIC INJURY TREATMENT Date/Time: 03/05/2017 12:51 PM Performed by: Merlyn Lot, MD Authorized by: Merlyn Lot, MD   Consent:    Consent obtained:  Verbal   Consent given by:  PatientInjury location: finger Location details: left index finger Injury type: dislocation Dislocation type: PIP Pre-procedure neurovascular assessment: neurovascularly intact Anesthesia: digital block  Anesthesia: Local anesthesia used: yes Local Anesthetic: bupivacaine 0.5% without epinephrine Manipulation performed: yes Reduction successful: yes X-ray confirmed reduction: yes Immobilization: splint Patient tolerance: Patient tolerated the procedure well with no immediate complications       Critical Care performed:  no ____________________________________________   INITIAL IMPRESSION / ASSESSMENT AND PLAN / ED COURSE  Pertinent labs & imaging results that were available during my care of the patient were reviewed by me and considered in my medical decision making (see chart for details).  DDX: fracture, dislocation, contusion  Tony Franco is a 67 y.o. who presents to the ED with finger injury and fracture with dislocation as described above.  Proximal interphalanx joint dislocation reduced after nerve  block successfully.  Confirmed by x-ray.  Laceration copiously irrigated with Hibiclens and loosely approximated.  Patient placed in bulky dressing with Xeroform as well as splint.  Will be discharged home with strict return precautions follow-up with orthopedics and antibiotics.      ____________________________________________   FINAL CLINICAL IMPRESSION(S) / ED DIAGNOSES  Final diagnoses:  Laceration of left index finger without foreign body without damage to nail, initial encounter  Dislocation of finger, initial encounter  Closed fracture of tuft of distal phalanx of finger      NEW MEDICATIONS STARTED DURING THIS VISIT:  New Prescriptions   CEPHALEXIN (KEFLEX) 500 MG CAPSULE    Take 1 capsule (500 mg total) by mouth 3 (three) times daily for 7 days.   HYDROCODONE-ACETAMINOPHEN (NORCO) 5-325 MG TABLET    Take 1 tablet by mouth every 4 (four) hours as needed for moderate pain.     Note:  This document was prepared using Dragon voice recognition software and may include unintentional dictation errors.    Merlyn Lot, MD 03/05/17 1255

## 2017-03-07 DIAGNOSIS — S61211A Laceration without foreign body of left index finger without damage to nail, initial encounter: Secondary | ICD-10-CM | POA: Diagnosis not present

## 2017-03-07 DIAGNOSIS — S61311D Laceration without foreign body of left index finger with damage to nail, subsequent encounter: Secondary | ICD-10-CM | POA: Diagnosis not present

## 2017-03-08 DIAGNOSIS — F320 Major depressive disorder, single episode, mild: Secondary | ICD-10-CM | POA: Diagnosis not present

## 2017-03-14 DIAGNOSIS — S61311D Laceration without foreign body of left index finger with damage to nail, subsequent encounter: Secondary | ICD-10-CM | POA: Diagnosis not present

## 2017-03-21 DIAGNOSIS — S61311D Laceration without foreign body of left index finger with damage to nail, subsequent encounter: Secondary | ICD-10-CM | POA: Diagnosis not present

## 2017-03-21 DIAGNOSIS — S62631D Displaced fracture of distal phalanx of left index finger, subsequent encounter for fracture with routine healing: Secondary | ICD-10-CM | POA: Diagnosis not present

## 2017-03-27 DIAGNOSIS — F320 Major depressive disorder, single episode, mild: Secondary | ICD-10-CM | POA: Diagnosis not present

## 2017-04-04 DIAGNOSIS — S62631D Displaced fracture of distal phalanx of left index finger, subsequent encounter for fracture with routine healing: Secondary | ICD-10-CM | POA: Diagnosis not present

## 2017-06-14 DIAGNOSIS — F320 Major depressive disorder, single episode, mild: Secondary | ICD-10-CM | POA: Diagnosis not present

## 2017-06-16 ENCOUNTER — Other Ambulatory Visit: Payer: Self-pay | Admitting: Family Medicine

## 2017-06-16 ENCOUNTER — Telehealth: Payer: Self-pay | Admitting: Family Medicine

## 2017-06-16 DIAGNOSIS — M170 Bilateral primary osteoarthritis of knee: Secondary | ICD-10-CM

## 2017-06-16 DIAGNOSIS — Z125 Encounter for screening for malignant neoplasm of prostate: Secondary | ICD-10-CM

## 2017-06-16 DIAGNOSIS — F330 Major depressive disorder, recurrent, mild: Secondary | ICD-10-CM

## 2017-06-16 DIAGNOSIS — Z0000 Encounter for general adult medical examination without abnormal findings: Secondary | ICD-10-CM

## 2017-06-16 DIAGNOSIS — F5101 Primary insomnia: Secondary | ICD-10-CM

## 2017-06-16 DIAGNOSIS — R7309 Other abnormal glucose: Secondary | ICD-10-CM

## 2017-06-16 DIAGNOSIS — N522 Drug-induced erectile dysfunction: Secondary | ICD-10-CM

## 2017-06-16 MED ORDER — TRAZODONE HCL 50 MG PO TABS: 50.0000 mg | ORAL_TABLET | Freq: Every day | ORAL | 1 refills | Status: DC

## 2017-06-16 NOTE — Telephone Encounter (Signed)
He has refills available, but I re-sent Trazodone to pharmacy.  He was advised to notify us about 1 month in advance before lab orders, and he was planning to go to [facility]. Now since lab scheduled for Monday 5/13, I have placed lab orders for [place] 06/16/2017, 12:43 PM

## 2017-06-16 NOTE — Telephone Encounter (Signed)
Pt needs a refill on trazodone sent to [facility].  He also requested doing lab work on Monday.  His call back number is [phone]

## 2017-06-19 ENCOUNTER — Other Ambulatory Visit: Payer: BLUE CROSS/BLUE SHIELD

## 2017-06-19 DIAGNOSIS — F320 Major depressive disorder, single episode, mild: Secondary | ICD-10-CM | POA: Diagnosis not present

## 2017-06-19 DIAGNOSIS — N522 Drug-induced erectile dysfunction: Secondary | ICD-10-CM | POA: Diagnosis not present

## 2017-06-19 DIAGNOSIS — Z125 Encounter for screening for malignant neoplasm of prostate: Secondary | ICD-10-CM

## 2017-06-19 DIAGNOSIS — Z0000 Encounter for general adult medical examination without abnormal findings: Secondary | ICD-10-CM

## 2017-06-19 DIAGNOSIS — F330 Major depressive disorder, recurrent, mild: Secondary | ICD-10-CM

## 2017-06-19 DIAGNOSIS — R7309 Other abnormal glucose: Secondary | ICD-10-CM

## 2017-06-19 DIAGNOSIS — F5101 Primary insomnia: Secondary | ICD-10-CM

## 2017-06-20 ENCOUNTER — Encounter: Payer: Self-pay | Admitting: Family Medicine

## 2017-06-20 DIAGNOSIS — E782 Mixed hyperlipidemia: Secondary | ICD-10-CM | POA: Insufficient documentation

## 2017-06-20 LAB — LIPID PANEL
CHOLESTEROL: 176 mg/dL (ref <200)
HDL: 37 mg/dL — ABNORMAL LOW (ref >40)
LDL CHOLESTEROL (CALC): 111 mg/dL — AB
Non-HDL Cholesterol (Calc): 139 mg/dL (calc) — ABNORMAL HIGH (ref <130)
TRIGLYCERIDES: 161 mg/dL — AB (ref <150)
Total CHOL/HDL Ratio: 4.8 (calc) (ref <5.0)

## 2017-06-20 LAB — COMPLETE METABOLIC PANEL WITH GFR
AG Ratio: 2 (calc) (ref 1.0–2.5)
ALT: 14 U/L (ref 9–46)
AST: 13 U/L (ref 10–35)
Albumin: 4.1 g/dL (ref 3.6–5.1)
Alkaline phosphatase (APISO): 56 U/L (ref 40–115)
BILIRUBIN TOTAL: 0.5 mg/dL (ref 0.2–1.2)
BUN: 11 mg/dL (ref 7–25)
CO2: 26 mmol/L (ref 20–32)
CREATININE: 0.94 mg/dL (ref 0.70–1.25)
Calcium: 9.1 mg/dL (ref 8.6–10.3)
Chloride: 108 mmol/L (ref 98–110)
GFR, Est African American: 99 mL/min/{1.73_m2} (ref >=60)
GFR, Est Non African American: 85 mL/min/{1.73_m2} (ref >=60)
GLOBULIN: 2.1 g/dL (ref 1.9–3.7)
Glucose, Bld: 108 mg/dL — ABNORMAL HIGH (ref 65–99)
Potassium: 4 mmol/L (ref 3.5–5.3)
Sodium: 142 mmol/L (ref 135–146)
Total Protein: 6.2 g/dL (ref 6.1–8.1)

## 2017-06-20 LAB — CBC WITH DIFFERENTIAL/PLATELET
BASOS PCT: 0.7 %
Basophils Absolute: 39 cells/uL (ref 0–200)
EOS PCT: 1.6 %
Eosinophils Absolute: 88 cells/uL (ref 15–500)
HCT: 44.2 % (ref 38.5–50.0)
Hemoglobin: 15.4 g/dL (ref 13.2–17.1)
Lymphs Abs: 1590 cells/uL (ref 850–3900)
MCH: 29.1 pg (ref 27.0–33.0)
MCHC: 34.8 g/dL (ref 32.0–36.0)
MCV: 83.6 fL (ref 80.0–100.0)
MONOS PCT: 7.8 %
MPV: 10.4 fL (ref 7.5–12.5)
Neutro Abs: 3355 cells/uL (ref 1500–7800)
Neutrophils Relative %: 61 %
PLATELETS: 231 10*3/uL (ref 140–400)
RBC: 5.29 10*6/uL (ref 4.20–5.80)
RDW: 12.9 % (ref 11.0–15.0)
Total Lymphocyte: 28.9 %
WBC mixed population: 429 cells/uL (ref 200–950)
WBC: 5.5 10*3/uL (ref 3.8–10.8)

## 2017-06-20 LAB — HEMOGLOBIN A1C
Hgb A1c MFr Bld: 5.5 % of total Hgb (ref <5.7)
Mean Plasma Glucose: 111 (calc)
eAG (mmol/L): 6.2 (calc)

## 2017-06-20 LAB — PSA, TOTAL WITH REFLEX TO PSA, FREE: PSA, TOTAL: 0.5 ng/mL (ref <=4.0)

## 2017-06-21 DIAGNOSIS — F320 Major depressive disorder, single episode, mild: Secondary | ICD-10-CM | POA: Diagnosis not present

## 2017-06-22 ENCOUNTER — Encounter: Payer: BLUE CROSS/BLUE SHIELD | Admitting: Family Medicine

## 2017-06-22 ENCOUNTER — Ambulatory Visit (INDEPENDENT_AMBULATORY_CARE_PROVIDER_SITE_OTHER): Payer: BLUE CROSS/BLUE SHIELD | Admitting: Family Medicine

## 2017-06-22 ENCOUNTER — Encounter: Payer: Self-pay | Admitting: Family Medicine

## 2017-06-22 VITALS — BP 130/76 | HR 80 | Temp 98.1°F | Resp 16 | Ht 72.0 in | Wt 189.0 lb

## 2017-06-22 DIAGNOSIS — F5103 Paradoxical insomnia: Secondary | ICD-10-CM | POA: Diagnosis not present

## 2017-06-22 DIAGNOSIS — N401 Enlarged prostate with lower urinary tract symptoms: Secondary | ICD-10-CM | POA: Insufficient documentation

## 2017-06-22 DIAGNOSIS — L821 Other seborrheic keratosis: Secondary | ICD-10-CM | POA: Diagnosis not present

## 2017-06-22 DIAGNOSIS — N138 Other obstructive and reflux uropathy: Secondary | ICD-10-CM

## 2017-06-22 DIAGNOSIS — E782 Mixed hyperlipidemia: Secondary | ICD-10-CM | POA: Diagnosis not present

## 2017-06-22 DIAGNOSIS — R7309 Other abnormal glucose: Secondary | ICD-10-CM | POA: Diagnosis not present

## 2017-06-22 DIAGNOSIS — M1990 Unspecified osteoarthritis, unspecified site: Secondary | ICD-10-CM | POA: Diagnosis not present

## 2017-06-22 DIAGNOSIS — F330 Major depressive disorder, recurrent, mild: Secondary | ICD-10-CM | POA: Diagnosis not present

## 2017-06-22 DIAGNOSIS — F5101 Primary insomnia: Secondary | ICD-10-CM | POA: Diagnosis not present

## 2017-06-22 DIAGNOSIS — L570 Actinic keratosis: Secondary | ICD-10-CM

## 2017-06-22 DIAGNOSIS — Z0000 Encounter for general adult medical examination without abnormal findings: Secondary | ICD-10-CM

## 2017-06-22 MED ORDER — MELOXICAM 15 MG PO TABS: 15.0000 mg | ORAL_TABLET | Freq: Every day | ORAL | 3 refills | Status: DC

## 2017-06-22 MED ORDER — TRAZODONE HCL 50 MG PO TABS: 50.0000 mg | ORAL_TABLET | Freq: Every day | ORAL | 3 refills | Status: DC

## 2017-06-22 MED ORDER — HYDROXYZINE HCL 25 MG PO TABS: 25.0000 mg | ORAL_TABLET | Freq: Every day | ORAL | 3 refills | Status: DC

## 2017-06-22 MED ORDER — DULOXETINE HCL 30 MG PO CPEP: 30.0000 mg | ORAL_CAPSULE | Freq: Every day | ORAL | 3 refills | Status: DC

## 2017-06-22 NOTE — Assessment & Plan Note (Signed)
Stable, secondary to depression and possible some primary insomnia Recent stressor poor sleep failed higher dose trazodone - now back to normal dose Controlled on SNRI and Trazodone, Hydroxyzine

## 2017-06-22 NOTE — Assessment & Plan Note (Signed)
Consistent clinically with chronic diagnosis BPH, with lower urinary tract symptoms (LUTS) w/o obstruction - AUA BPH score 19 (moderate, 06/2017)  - never on medication - Last PSA 0.5 (06/2017) - Last DRE 2 yr ago normal reported - No known personal/family history of prostate CA  Plan: 1. Reviewed dx BPH - offered medication w/ Tamsulosin - he declines, future may consider Saw palmetto supplement 2. Follow-up q 6-12 mo monitor BPH symptoms - if worse or change consider med
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
## 2017-06-22 NOTE — Assessment & Plan Note (Signed)
Mostly stable controlled cholesterol on lifestyle Last lipid panel 06/2017 Calculated ASCVD 10 yr risk score 11.9%  Plan: 1. Discussed ASCVD risk reduction - prefer to avoid statin for now, defer until future inc risk 2. Decline ASA 3. Encourage improved lifestyle - low carb/cholesterol, reduce portion size, continue improving regular exercise 4. Follow-up yearly lipid

## 2017-06-22 NOTE — Patient Instructions (Addendum)
Thank you for coming to the office today.  All rx refilled - let me know if any issues  Cleared to participate  Keep up the good work overall  No change to meds  Due to urinary symptoms - most likely BPH causing some urinary symptoms - if ever interested can try Flomax (Tamsulosin) 0.4mg  daily - let me know   Please schedule a Follow-up Appointment to: Return in about 6 months (around 12/23/2017) for Mood PHQ/GAD, Arthritis, BPH.  If you have any other questions or concerns, please feel free to call the office or send a message through [facility]. You may also schedule an earlier appointment if necessary.  Additionally, you may be receiving a survey about your experience at our office within a few days to 1 week by e-mail or mail. We value your feedback.  [name], DO [place]

## 2017-06-22 NOTE — Assessment & Plan Note (Signed)
Stable, currently controlled on medications - recent flare up few months due to family stressor, now significantly improved No longer has passive suicidal ideation Followed by therapist / mental health - Complicated by insomnia, now improved - on current meds  Plan: 1. Continue current Duloxetine 30mg  daily, Trazodone 50mg  at night - Continue Hydroxyzine 1-2 tabs nightly still with good results 2. Continue therapy / counseling 3. Follow-up 6 month med review

## 2017-07-17 ENCOUNTER — Encounter: Payer: BLUE CROSS/BLUE SHIELD | Admitting: Family Medicine

## 2017-07-28 ENCOUNTER — Encounter: Payer: Self-pay | Admitting: Family Medicine

## 2017-07-28 ENCOUNTER — Ambulatory Visit
Admission: RE | Admit: 2017-07-28 | Discharge: 2017-07-28 | Disposition: A | Discharge status: Home / Self Care | Payer: BLUE CROSS/BLUE SHIELD | Source: Ambulatory Visit | Attending: Family Medicine | Admitting: Family Medicine

## 2017-07-28 ENCOUNTER — Ambulatory Visit (INDEPENDENT_AMBULATORY_CARE_PROVIDER_SITE_OTHER): Payer: BLUE CROSS/BLUE SHIELD | Admitting: Family Medicine

## 2017-07-28 VITALS — BP 121/77 | HR 81 | Temp 98.4°F | Resp 16 | Ht 72.0 in | Wt 184.0 lb

## 2017-07-28 DIAGNOSIS — R079 Chest pain, unspecified: Secondary | ICD-10-CM | POA: Insufficient documentation

## 2017-07-28 DIAGNOSIS — R0789 Other chest pain: Secondary | ICD-10-CM | POA: Diagnosis not present

## 2017-07-28 DIAGNOSIS — F5101 Primary insomnia: Secondary | ICD-10-CM | POA: Diagnosis not present

## 2017-07-28 DIAGNOSIS — J3089 Other allergic rhinitis: Secondary | ICD-10-CM

## 2017-07-28 DIAGNOSIS — F330 Major depressive disorder, recurrent, mild: Secondary | ICD-10-CM | POA: Diagnosis not present

## 2017-07-28 MED ORDER — IPRATROPIUM BROMIDE 0.06 % NA SOLN
2.0000 | Freq: Four times a day (QID) | NASAL | 0 refills | Status: DC
Start: 2017-07-28 — End: 2018-01-15

## 2017-07-28 MED ORDER — FLUTICASONE PROPIONATE 50 MCG/ACT NA SUSP
2.0000 | Freq: Every day | NASAL | 3 refills | Status: DC
Start: 2017-07-28 — End: 2018-01-15

## 2017-07-28 MED ORDER — ASPIRIN 81 MG PO CHEW
324.0000 mg | CHEWABLE_TABLET | Freq: Once | ORAL | Status: AC
  Administered 2017-07-28: 324 mg via ORAL
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
## 2017-07-28 NOTE — Patient Instructions (Addendum)
Thank you for coming to the office today.  Most likely symptoms from anxiety / mood and insomnia  We are concerned about your heart  You were given Aspirin 81 x 4 = Full Dose Aspirin 324mg  here  EKG did not show evidence of acute problem  ----------------------------------  For headache  Recommend to start taking Tylenol Extra Strength 500mg  tabs - take 1 to 2 tabs per dose (max 1000mg ) every 6-8 hours for pain (take regularly, don't skip a dose for next 7 days), max 24 hour daily dose is 6 tablets or 3000mg . In the future you can repeat the same everyday Tylenol course for 1-2 weeks at a time.   May take Excedrin Migraine  If taking Meloxicam you cannot take Advil Ibuprofen Aleve  Start Atrovent nasal spray decongestant 2 sprays in each nostril up to 4 times daily for 7 days  Start nasal steroid Flonase 2 sprays in each nostril daily for 4-6 weeks, may repeat course seasonally or as needed  If you have any significant chest pain that does not go away within 30 minutes, is accompanied by nausea, sweating, shortness of breath, or made worse by activity, this may be evidence of a heart attack, especially if symptoms worsening instead of improving, please call 911 or go directly to the emergency room immediately for evaluation.     Please schedule a Follow-up Appointment to: Return if symptoms worsen or fail to improve, for ED f/u.  If you have any other questions or concerns, please feel free to call the office or send a message through [place]. You may also schedule an earlier appointment if necessary.  Additionally, you may be receiving a survey about your experience at our office within a few days to 1 week by e-mail or mail. We value your feedback.  [name], DO [place]

## 2017-08-18 DIAGNOSIS — Z23 Encounter for immunization: Secondary | ICD-10-CM | POA: Diagnosis not present

## 2017-09-12 DIAGNOSIS — H25813 Combined forms of age-related cataract, bilateral: Secondary | ICD-10-CM | POA: Diagnosis not present

## 2018-01-15 ENCOUNTER — Encounter: Payer: Self-pay | Admitting: Family Medicine

## 2018-01-15 ENCOUNTER — Ambulatory Visit (INDEPENDENT_AMBULATORY_CARE_PROVIDER_SITE_OTHER): Payer: BLUE CROSS/BLUE SHIELD | Admitting: Family Medicine

## 2018-01-15 VITALS — BP 130/75 | HR 91 | Temp 98.2°F | Resp 16 | Ht 72.0 in | Wt 187.0 lb

## 2018-01-15 DIAGNOSIS — J9801 Acute bronchospasm: Secondary | ICD-10-CM | POA: Diagnosis not present

## 2018-01-15 DIAGNOSIS — R0602 Shortness of breath: Secondary | ICD-10-CM

## 2018-01-15 DIAGNOSIS — Z23 Encounter for immunization: Secondary | ICD-10-CM

## 2018-01-15 DIAGNOSIS — M9901 Segmental and somatic dysfunction of cervical region: Secondary | ICD-10-CM | POA: Diagnosis not present

## 2018-01-15 DIAGNOSIS — M5431 Sciatica, right side: Secondary | ICD-10-CM | POA: Diagnosis not present

## 2018-01-15 DIAGNOSIS — M62830 Muscle spasm of back: Secondary | ICD-10-CM | POA: Diagnosis not present

## 2018-01-15 DIAGNOSIS — M9903 Segmental and somatic dysfunction of lumbar region: Secondary | ICD-10-CM | POA: Diagnosis not present

## 2018-01-15 MED ORDER — ALBUTEROL SULFATE HFA 108 (90 BASE) MCG/ACT IN AERS: 2.0000 | INHALATION_SPRAY | Freq: Four times a day (QID) | RESPIRATORY_TRACT | 2 refills | Status: DC | PRN

## 2018-01-15 NOTE — Progress Notes (Signed)
Subjective:    Patient ID: Tony Franco, male    DOB: 1950-06-01, 67 y.o.   MRN: [id number]  Tony Franco is a 67 y.o. male presenting on 01/15/2018 for Shortness of Breath (cough, dust allergy onset weeks)   HPI   Shortness of Breath Reports he recently noticed worsening problem while in [place] in past 1 week, describes feeling "a little bit harder to breath" he is "aware of it" now. Admits a mild persistent non productive cough for a while - worse if triggered by any dust or allergy, he has been doing some renovation work on a house. Even sitting at rest has same problem. He thinks that exertion does make a difference in worsening some of the dyspnea symptoms. - Growing up he was around father who smoked, therefore some 2nd hand smoke - father passed from emphysema - he may have coughing spells for few minutes, worse if laying down at times. - He has already tried Flonase regularly in past for several weeks without significant improvement. Also tried Atrovent PRN. - No history of asthma or COPD known. He has albuterol inhaler on file but he states never used before. - Denies any wheezing, chest pain or tightness, palpitations, near syncope, headache  Additional update / Mood / Insomnia - 1 month ago - self discontinued Duloxetine 30mg  and was hopeful to come off more medicine, but determined he was doing better still on Trazodone and Hydroxyzine.  Health Maintenance: Due for Flu Shot, will receive today    Depression screen [facility] 2/9 01/15/2018 01/15/2018 07/28/2017  Decreased Interest 0 0 3  Down, Depressed, Hopeless 0 0 2  PHQ - 2 Score 0 0 5  Altered sleeping 2 - 3  Tired, decreased energy 1 - 2  Change in appetite 0 - 0  Feeling bad or failure about yourself  0 - 0  Trouble concentrating 0 - 0  Moving slowly or fidgety/restless 0 - 0  Suicidal thoughts 0 - 1  PHQ-9 Score 3 - 11  Difficult doing work/chores Not difficult at all - Extremely dIfficult    Social History    Tobacco Use  . Smoking status: Never Smoker  . Smokeless tobacco: Never Used  Substance Use Topics  . Alcohol use: Yes    Alcohol/week: 1.0 standard drinks    Types: 1 Cans of beer per week  . Drug use: No    Review of Systems Per HPI unless specifically indicated above     Objective:    BP 130/75   Pulse 91   Temp 98.2 F (36.8 C) (Oral)   Resp 16   Ht 6' (1.829 m)   Wt 187 lb (84.8 kg)   SpO2 97%   BMI 25.36 kg/m   Wt Readings from Last 3 Encounters:  01/15/18 187 lb (84.8 kg)  07/28/17 184 lb (83.5 kg)  06/22/17 189 lb (85.7 kg)    Physical Exam  Constitutional: He is oriented to person, place, and time. He appears well-developed and well-nourished. No distress.  Well-appearing, comfortable, cooperative  HENT:  Head: Normocephalic and atraumatic.  Mouth/Throat: Oropharynx is clear and moist.  Eyes: Conjunctivae are normal. Right eye exhibits no discharge. Left eye exhibits no discharge.  Neck: Normal range of motion. Neck supple. No thyromegaly present.  No carotid bruit  Cardiovascular: Normal rate, regular rhythm, normal heart sounds and intact distal pulses.  No murmur heard. Pulmonary/Chest: Effort normal and breath sounds normal. No respiratory distress. He has no wheezes. He  has no rales.  Good air movement. No focal abnormality.  Frequent dry cough  Musculoskeletal: Normal range of motion. He exhibits no edema.  Lymphadenopathy:    He has no cervical adenopathy.  Neurological: He is alert and oriented to person, place, and time.  Skin: Skin is warm and dry. No rash noted. He is not diaphoretic. No erythema.  Psychiatric: He has a normal mood and affect. His behavior is normal.  Well groomed, good eye contact, normal speech and thoughts  Nursing note and vitals reviewed.  Results for orders placed or performed in visit on 06/19/17  PSA, Total with Reflex to PSA, Free  Result Value Ref Range   PSA, Total 0.5 < OR = 4.0 ng/mL  Lipid panel  Result  Value Ref Range   Cholesterol 176 <200 mg/dL   HDL 37 (L) >40 mg/dL   Triglycerides 161 (H) <150 mg/dL   LDL Cholesterol (Calc) 111 (H) mg/dL (calc)   Total CHOL/HDL Ratio 4.8 <5.0 (calc)   Non-HDL Cholesterol (Calc) 139 (H) <130 mg/dL (calc)  COMPLETE METABOLIC PANEL WITH GFR  Result Value Ref Range   Glucose, Bld 108 (H) 65 - 99 mg/dL   BUN 11 7 - 25 mg/dL   Creat 0.94 0.70 - 1.25 mg/dL   GFR, Est Non African American 85 > OR = 60 mL/min/1.42m2   GFR, Est African American 99 > OR = 60 mL/min/1.62m2   BUN/Creatinine Ratio NOT APPLICABLE 6 - 22 (calc)   Sodium 142 135 - 146 mmol/L   Potassium 4.0 3.5 - 5.3 mmol/L   Chloride 108 98 - 110 mmol/L   CO2 26 20 - 32 mmol/L   Calcium 9.1 8.6 - 10.3 mg/dL   Total Protein 6.2 6.1 - 8.1 g/dL   Albumin 4.1 3.6 - 5.1 g/dL   Globulin 2.1 1.9 - 3.7 g/dL (calc)   AG Ratio 2.0 1.0 - 2.5 (calc)   Total Bilirubin 0.5 0.2 - 1.2 mg/dL   Alkaline phosphatase (APISO) 56 40 - 115 U/L   AST 13 10 - 35 U/L   ALT 14 9 - 46 U/L  CBC with Differential/Platelet  Result Value Ref Range   WBC 5.5 3.8 - 10.8 Thousand/uL   RBC 5.29 4.20 - 5.80 Million/uL   Hemoglobin 15.4 13.2 - 17.1 g/dL   HCT 44.2 38.5 - 50.0 %   MCV 83.6 80.0 - 100.0 fL   MCH 29.1 27.0 - 33.0 pg   MCHC 34.8 32.0 - 36.0 g/dL   RDW 12.9 11.0 - 15.0 %   Platelets 231 140 - 400 Thousand/uL   MPV 10.4 7.5 - 12.5 fL   Neutro Abs 3,355 1,500 - 7,800 cells/uL   Lymphs Abs 1,590 850 - 3,900 cells/uL   WBC mixed population 429 200 - 950 cells/uL   Eosinophils Absolute 88 15 - 500 cells/uL   Basophils Absolute 39 0 - 200 cells/uL   Neutrophils Relative % 61 %   Total Lymphocyte 28.9 %   Monocytes Relative 7.8 %   Eosinophils Relative 1.6 %   Basophils Relative 0.7 %  Hemoglobin A1c  Result Value Ref Range   Hgb A1c MFr Bld 5.5 <5.7 % of total Hgb   Mean Plasma Glucose 111 (calc)   eAG (mmol/L) 6.2 (calc)      Assessment & Plan:   Problem List Items Addressed This Visit    None      Visit Diagnoses    Shortness of breath    -  Primary   Relevant Medications   albuterol (PROVENTIL HFA;VENTOLIN HFA) 108 (90 Base) MCG/ACT inhaler   Other Relevant Orders   Ambulatory referral to Pulmonology   Cough due to bronchospasm       Relevant Medications   albuterol (PROVENTIL HFA;VENTOLIN HFA) 108 (90 Base) MCG/ACT inhaler   Other Relevant Orders   Ambulatory referral to Pulmonology   Needs flu shot       Relevant Orders   Flu Vaccine QUAD 36+ mos IM (Completed)      Clinically persistent mild dyspnea, seems to be affecting his function now, difficult to discern but seems to be more of a gradual worsening chronic problem. No evidence of acute pulmonary or respiratory issue. No recent URI or respiratory illness. - No Asthma or COPD previously. Limited risk factors except some 2nd hand smoke as child. - Clinically without edema or sign of volume overload, heart exam is reassuring - Lungs are non focal but frequent dry cough, not on meds to trigger this - last CXR 07/2017 - unremarkable lungs  Plan - Referral to [facility] for evaluation 2nd opinion, he would likely benefit from PFTs and possibly CT Lung imaging for further characterizing this issue. - In meantime, rx Albuterol inhaler PRN, demo use - and he will keep track of if helping or how often he uses it, review with Pulm - if his pulm apt is not for 2-3 weeks or into January 2020, then he may notify me and we can consider order CT Chest Lungs sooner if need before he sees Pulm we can confirm w/ them if prefer higher resolution or low dose.  Strict return precautions given if not improving or worsening symptoms, including dyspnea, chest pain or other issues, when to go to hospital ED  Meds ordered this encounter  Medications  . albuterol (PROVENTIL HFA;VENTOLIN HFA) 108 (90 Base) MCG/ACT inhaler    Sig: Inhale 2 puffs into the lungs every 6 (six) hours as needed for wheezing or shortness of breath (cough).     Dispense:  1 Inhaler    Refill:  2    Orders Placed This Encounter  Procedures  . Flu Vaccine QUAD 36+ mos IM  . Ambulatory referral to Pulmonology    Referral Priority:   Routine    Referral Type:   Consultation    Referral Reason:   Specialty Services Required    Requested Specialty:   Pulmonary Disease    Number of Visits Requested:   1    Follow up plan: Return in about 6 weeks (around 02/26/2018), or if symptoms worsen or fail to improve, for 6wk to 3 months follow-up Breathing / Pulmonology.   [name], [facility] 01/15/2018, 2:05 PM

## 2018-01-15 NOTE — Patient Instructions (Addendum)
Thank you for coming to the office today.  Use Albuterol inhaler as instructed, as needed only - keep track if it is helping. And how often you use it.  [facility] [address] [place], [place] Phone: [phone]  CALL ME BACK - once you get your Lung [facility] - if it is too close to end of year Dec 2019 or into January 2020 - then we will likely offer a CT Low dose lung imaging test BEFORE you are seen, we can order this sooner if you need. Otherwise lung doctors may order it.  If acute worsening dyspnea short of breath or new concerns fever or chest pain may seek care at hospital ED quicker  Please schedule a Follow-up Appointment to: Return in about 6 weeks (around 02/26/2018), or if symptoms worsen or fail to improve, for 6wk to 3 months follow-up Breathing / Pulmonology.  If you have any other questions or concerns, please feel free to call the office or send a message through [place]. You may also schedule an earlier appointment if necessary.  Additionally, you may be receiving a survey about your experience at our office within a few days to 1 week by e-mail or mail. We value your feedback.  [name], DO [place]

## 2018-01-16 DIAGNOSIS — M9903 Segmental and somatic dysfunction of lumbar region: Secondary | ICD-10-CM | POA: Diagnosis not present

## 2018-01-16 DIAGNOSIS — M9901 Segmental and somatic dysfunction of cervical region: Secondary | ICD-10-CM | POA: Diagnosis not present

## 2018-01-16 DIAGNOSIS — M62830 Muscle spasm of back: Secondary | ICD-10-CM | POA: Diagnosis not present

## 2018-01-16 DIAGNOSIS — M5431 Sciatica, right side: Secondary | ICD-10-CM | POA: Diagnosis not present

## 2018-01-22 DIAGNOSIS — M62830 Muscle spasm of back: Secondary | ICD-10-CM | POA: Diagnosis not present

## 2018-01-22 DIAGNOSIS — M9903 Segmental and somatic dysfunction of lumbar region: Secondary | ICD-10-CM | POA: Diagnosis not present

## 2018-01-22 DIAGNOSIS — M5431 Sciatica, right side: Secondary | ICD-10-CM | POA: Diagnosis not present

## 2018-01-22 DIAGNOSIS — M9901 Segmental and somatic dysfunction of cervical region: Secondary | ICD-10-CM | POA: Diagnosis not present

## 2018-01-29 DIAGNOSIS — M9903 Segmental and somatic dysfunction of lumbar region: Secondary | ICD-10-CM | POA: Diagnosis not present

## 2018-01-29 DIAGNOSIS — M62830 Muscle spasm of back: Secondary | ICD-10-CM | POA: Diagnosis not present

## 2018-01-29 DIAGNOSIS — M5431 Sciatica, right side: Secondary | ICD-10-CM | POA: Diagnosis not present

## 2018-01-29 DIAGNOSIS — M9901 Segmental and somatic dysfunction of cervical region: Secondary | ICD-10-CM | POA: Diagnosis not present

## 2018-02-02 DIAGNOSIS — M5431 Sciatica, right side: Secondary | ICD-10-CM | POA: Diagnosis not present

## 2018-02-02 DIAGNOSIS — M9901 Segmental and somatic dysfunction of cervical region: Secondary | ICD-10-CM | POA: Diagnosis not present

## 2018-02-02 DIAGNOSIS — M9903 Segmental and somatic dysfunction of lumbar region: Secondary | ICD-10-CM | POA: Diagnosis not present

## 2018-02-02 DIAGNOSIS — M62830 Muscle spasm of back: Secondary | ICD-10-CM | POA: Diagnosis not present

## 2018-02-05 DIAGNOSIS — M62830 Muscle spasm of back: Secondary | ICD-10-CM | POA: Diagnosis not present

## 2018-02-05 DIAGNOSIS — M9901 Segmental and somatic dysfunction of cervical region: Secondary | ICD-10-CM | POA: Diagnosis not present

## 2018-02-05 DIAGNOSIS — M9903 Segmental and somatic dysfunction of lumbar region: Secondary | ICD-10-CM | POA: Diagnosis not present

## 2018-02-05 DIAGNOSIS — M5431 Sciatica, right side: Secondary | ICD-10-CM | POA: Diagnosis not present

## 2018-02-16 ENCOUNTER — Institutional Professional Consult (permissible substitution): Payer: BLUE CROSS/BLUE SHIELD | Admitting: Internal Medicine

## 2018-02-19 ENCOUNTER — Ambulatory Visit (INDEPENDENT_AMBULATORY_CARE_PROVIDER_SITE_OTHER): Payer: Self-pay | Admitting: Internal Medicine

## 2018-02-19 ENCOUNTER — Encounter: Payer: Self-pay | Admitting: Internal Medicine

## 2018-02-19 VITALS — BP 108/70 | HR 86 | Resp 16 | Ht 72.0 in | Wt 180.0 lb

## 2018-02-19 DIAGNOSIS — R0602 Shortness of breath: Secondary | ICD-10-CM

## 2018-02-19 DIAGNOSIS — J4550 Severe persistent asthma, uncomplicated: Secondary | ICD-10-CM

## 2018-02-19 MED ORDER — MONTELUKAST SODIUM 10 MG PO TABS: 10.0000 mg | ORAL_TABLET | Freq: Every day | ORAL | 3 refills | Status: DC

## 2018-02-19 MED ORDER — ALBUTEROL SULFATE HFA 108 (90 BASE) MCG/ACT IN AERS: 1.0000 | INHALATION_SPRAY | Freq: Four times a day (QID) | RESPIRATORY_TRACT | 5 refills | Status: DC | PRN

## 2018-02-19 MED ORDER — BECLOMETHASONE DIPROP HFA 80 MCG/ACT IN AERB: 2.0000 | INHALATION_SPRAY | Freq: Two times a day (BID) | RESPIRATORY_TRACT | 2 refills | Status: AC

## 2018-02-19 NOTE — Patient Instructions (Addendum)
You likely have asthma.  Will start you on an maintenance daily inhaler and montelukast tablet.  Use a rescue (albuterol) inhaler 1-2 puffs when you feel your chest is tight.  Take an antihistamine daily.  You should try to identify things which make your breathing worse and try to reduce/avoid exposures whenever that is possible.

## 2018-02-19 NOTE — Progress Notes (Signed)
[facility] Consultation      Assessment and Plan:  Dyspnea, likely due to allergic asthma.  --Will start inhaled steroid, and as needed rescue inhaler.  Will start Singulair once nightly. --Use mask whenever exposed to dust. - Patient asked to identify triggers and try to avoid them.  He should definitely use a mask when working on renovating his home.  Chronic allergic rhinitis. - Take an over-the-counter antihistamine once daily. -May be contributed to asthma and cough symptoms.    Date: 02/19/2018  MRN# [id number] Tony Franco [birth year]    Tony Franco is a 68 y.o. old male seen in consultation for chief complaint of:    Chief Complaint  Patient presents with  . Consult    pt referred by Dr. Parks Ranger. He is having sudden sob with certain enviromental changes.    HPI:  He notes that over the past few years when walking into a restaurant his breathing would be heavy. This has progressed when walking into inside places he notes the same thing. He works as a Personnel officer. He will ignore it if he stays in the space, and then the breathing improves. He feels that he has it currently.  He has been renovating a house, and when he is exposed to dust he notes that he starts coughing. When he wears a mask the problem does not occur.  He has no pets. Denies reflux. He has a lot of sinus draiange, he has tried OTC tablets antihistamines which does help with the drainage.   He had a bout of depression last year, with family issues, he was started on antidepressants, he is now off of them. He takes trazodone at night. He was having progressive breathing problems  before the depression occurred.   **CBC 06/19/2017>> absolute eosinophil count 88. **Spirometry 02/19/2018>> raw tracings personally reviewed, pulmonary function within normal limits.  No evidence of obstructive lung disease. **Chest x-ray 07/28/2017>> imaging personally reviewed, few old right  rib fractures lungs are otherwise unremarkable.  PMHX:   Past Medical History:  Diagnosis Date  . Depression    Surgical Hx:  Past Surgical History:  Procedure Laterality Date  . COLONOSCOPY WITH PROPOFOL N/A 04/28/2015   Procedure: COLONOSCOPY WITH PROPOFOL;  Surgeon: Christene Lye, MD;  Location: [facility] ENDOSCOPY;  Service: Endoscopy;  Laterality: N/A;  . KNEE ARTHROSCOPY     left knee   Family Hx:  Family History  Problem Relation Age of Onset  . Heart attack Mother   . Heart attack Father    Social Hx:   Social History   Tobacco Use  . Smoking status: Never Smoker  . Smokeless tobacco: Never Used  Substance Use Topics  . Alcohol use: Yes    Alcohol/week: 1.0 standard drinks    Types: 1 Cans of beer per week  . Drug use: No   Medication:    Current Outpatient Medications:  .  albuterol (PROVENTIL HFA;VENTOLIN HFA) 108 (90 Base) MCG/ACT inhaler, Inhale 2 puffs into the lungs every 6 (six) hours as needed for wheezing or shortness of breath (cough)., Disp: 1 Inhaler, Rfl: 2 .  traZODone (DESYREL) 50 MG tablet, Take 1 tablet (50 mg total) by mouth at bedtime., Disp: 90 tablet, Rfl: 3   Allergies:  Patient has no known allergies.  Review of Systems: Gen:  Denies  fever, sweats, chills HEENT: Denies blurred vision, double vision. bleeds, sore throat Cvc:  No dizziness, chest pain. Resp:   Denies  cough or sputum production, shortness of breath Gi: Denies swallowing difficulty, stomach pain. Gu:  Denies bladder incontinence, burning urine Ext:   No Joint pain, stiffness. Skin: No skin rash,  hives  Endoc:  No polyuria, polydipsia. Psych: No depression, insomnia. Other:  All other systems were reviewed with the patient and were negative other that what is mentioned in the HPI.   Physical Examination:   VS: BP 108/70 (BP Location: Left Arm, Cuff Size: Normal)   Pulse 86   Resp 16   Ht 6' (1.829 m)   Wt 180 lb (81.6 kg)   SpO2 95%   BMI 24.41 kg/m     General Appearance: No distress  Neuro:without focal findings,  speech normal,  HEENT: PERRLA, EOM intact.   Pulmonary: normal breath sounds, No wheezing.  CardiovascularNormal S1,S2.  No m/r/g.   Abdomen: Benign, Soft, non-tender. Renal:  No costovertebral tenderness  GU:  No performed at this time. Endoc: No evident thyromegaly, no signs of acromegaly. Skin:   warm, no rashes, no ecchymosis  Extremities: normal, no cyanosis, clubbing.  Other findings:    LABORATORY PANEL:   CBC No results for input(s): WBC, HGB, HCT, PLT in the last 168 hours. ------------------------------------------------------------------------------------------------------------------  Chemistries  No results for input(s): NA, K, CL, CO2, GLUCOSE, BUN, CREATININE, CALCIUM, MG, AST, ALT, ALKPHOS, BILITOT in the last 168 hours.  Invalid input(s): GFRCGP ------------------------------------------------------------------------------------------------------------------  Cardiac Enzymes No results for input(s): TROPONINI in the last 168 hours. ------------------------------------------------------------  RADIOLOGY:  No results found.     Thank  you for the consultation and for allowing [facility] to assist in the care of your patient. Our recommendations are noted above.  Please contact us if we can be of further service.   Marda Stalker, M.D., F.C.C.P.  Board Certified in Internal Medicine, Pulmonary Medicine, [place], and Sleep Medicine.  [facility] Office Number: [phone]   02/19/2018

## 2018-08-24 ENCOUNTER — Ambulatory Visit (INDEPENDENT_AMBULATORY_CARE_PROVIDER_SITE_OTHER): Payer: Medicare Other | Admitting: Family Medicine

## 2018-08-24 ENCOUNTER — Encounter: Payer: Self-pay | Admitting: Family Medicine

## 2018-08-24 ENCOUNTER — Other Ambulatory Visit: Payer: Self-pay

## 2018-08-24 DIAGNOSIS — Z113 Encounter for screening for infections with a predominantly sexual mode of transmission: Secondary | ICD-10-CM

## 2018-08-24 DIAGNOSIS — Z7252 High risk homosexual behavior: Secondary | ICD-10-CM

## 2018-08-24 DIAGNOSIS — Z202 Contact with and (suspected) exposure to infections with a predominantly sexual mode of transmission: Secondary | ICD-10-CM

## 2018-08-24 MED ORDER — DESCOVY 200-25 MG PO TABS
1.0000 | ORAL_TABLET | Freq: Every day | ORAL | 2 refills | Status: DC
Start: 2018-08-24 — End: 2018-11-22

## 2018-08-24 NOTE — Patient Instructions (Signed)
AVS info given by phone. 

## 2018-08-24 NOTE — Progress Notes (Signed)
Virtual Visit via Telephone The purpose of this virtual visit is to provide medical care while limiting exposure to the novel coronavirus (COVID19) for both patient and office staff.  Consent was obtained for phone visit:  Yes.   Answered questions that patient had about telehealth interaction:  Yes.   I discussed the limitations, risks, security and privacy concerns of performing an evaluation and management service by telephone. I also discussed with the patient that there may be a patient responsible charge related to this service. The patient expressed understanding and agreed to proceed.  Patient Location: Home Provider Location: [facility])  ---------------------------------------------------------------------- Chief Complaint  Patient presents with  . Unprotected Sex    would like to start a medication to prevent getting HIV    S: Reviewed CMA documentation. I have called patient and gathered additional HPI as follows:  High Risk Homosexual Behavior / STD Testing Reports that he has recently established a new romantic relationship with younger male partner. He says that it has been a while since he was last sexually active. He is interested in PrEP therapy. No known contact with HIV positive partner or other STD but he would like STD panel as well. - Improved mood now with new partner in life, see PHQ Still insomnia difficulty - in past tried Trazodone, he chooses not to take it. No longer on. He does drink 1 glass red white most nights, working against him and he thinks helps initially then difficulty falling back asleep. He prefers to not be on sleeping med  Denies any high risk travel to areas of current concern for COVID19. Denies any known or suspected exposure to person with or possibly with COVID19.  Denies any fevers, chills, sweats, body ache, cough, shortness of breath, sinus pain or pressure, headache, abdominal pain, diarrhea  Past Medical  History:  Diagnosis Date  . Depression    Social History   Tobacco Use  . Smoking status: Never Smoker  . Smokeless tobacco: Never Used  Substance Use Topics  . Alcohol use: Yes    Alcohol/week: 1.0 standard drinks    Types: 1 Glasses of wine per week    Frequency: Never  . Drug use: No    Current Outpatient Medications:  .  albuterol (PROAIR HFA) 108 (90 Base) MCG/ACT inhaler, Inhale 1-2 puffs into the lungs every 6 (six) hours as needed for wheezing or shortness of breath. (Patient not taking: Reported on 08/24/2018), Disp: 1 Inhaler, Rfl: 5 .  albuterol (PROVENTIL HFA;VENTOLIN HFA) 108 (90 Base) MCG/ACT inhaler, Inhale 2 puffs into the lungs every 6 (six) hours as needed for wheezing or shortness of breath (cough). (Patient not taking: Reported on 08/24/2018), Disp: 1 Inhaler, Rfl: 2 .  beclomethasone (QVAR REDIHALER) 80 MCG/ACT inhaler, Inhale 2 puffs into the lungs 2 (two) times daily. Rinse mouth after use. (Patient not taking: Reported on 08/24/2018), Disp: 1 Inhaler, Rfl: 2 .  DESCOVY 200-25 MG tablet, Take 1 tablet by mouth daily., Disp: 30 tablet, Rfl: 2 .  montelukast (SINGULAIR) 10 MG tablet, Take 1 tablet (10 mg total) by mouth at bedtime. (Patient not taking: Reported on 08/24/2018), Disp: 30 tablet, Rfl: 3 .  montelukast (SINGULAIR) 10 MG tablet, Take 1 tablet (10 mg total) by mouth at bedtime. (Patient not taking: Reported on 08/24/2018), Disp: 30 tablet, Rfl: 3 .  traZODone (DESYREL) 50 MG tablet, Take 1 tablet (50 mg total) by mouth at bedtime. (Patient not taking: Reported on 08/24/2018), Disp: 90 tablet,  Rfl: 3  Depression screen [facility] 2/9 08/24/2018 01/15/2018 01/15/2018  Decreased Interest 0 0 0  Down, Depressed, Hopeless 0 0 0  PHQ - 2 Score 0 0 0  Altered sleeping 3 2 -  Tired, decreased energy 0 1 -  Change in appetite 0 0 -  Feeling bad or failure about yourself  0 0 -  Trouble concentrating 0 0 -  Moving slowly or fidgety/restless 0 0 -  Suicidal thoughts 0 0 -   PHQ-9 Score 3 3 -  Difficult doing work/chores Somewhat difficult Not difficult at all -    GAD 7 : Generalized Anxiety Score 07/28/2017  Nervous, Anxious, on Edge 2  Control/stop worrying 3  Worry too much - different things 2  Trouble relaxing 3  Restless 0  Easily annoyed or irritable 2  Afraid - awful might happen 3  Total GAD 7 Score 15  Anxiety Difficulty Extremely difficult    -------------------------------------------------------------------------- O: No physical exam performed due to remote telephone encounter.  Lab results reviewed.  No results found for this or any previous visit (from the past 2160 hour(s)).  -------------------------------------------------------------------------- A&P:  Problem List Items Addressed This Visit    None    Visit Diagnoses    High risk homosexual behavior    -  Primary   Relevant Medications   DESCOVY 200-25 MG tablet   Other Relevant Orders   GC/Chlamydia probe amp ([place])not at [facility]   CT/NG RNA, TMA Rectal   GC/CT Probe, Amp (Throat)   HIV Antibody (routine testing w rflx)   Hepatitis, Acute   RPR   COMPLETE METABOLIC PANEL WITH GFR     Clinically patient is at risk for STD / HIV with high risk homosexual behavior if having unprotected anal intercourse. His current partner is reported to be HIV negative. He has no known exposure with any partner with HIV or other STD. - Goal is prevention at this time, he is planning to resume sexual activity  Plan - Discussion on PrEP therapy - mechanism, meds, dosing, benefits, risk - questions answered - Proceed with PrEP rx - Descovy sent rx to pharmacy 30 day with 2 refills for initial course. - Prior to starting med, he was asked to arrive to office next week for series of lab testing routine prior to starting PrEP with STD panel including HIV and Hepatitis, see orders., additionally swab tests recommended, also chemistry panel for future medication safety ordered. -  Counseling on safe sex practices with condom as well - After review results, will notify patient if it is safe to initiate Descovy therapy, he should follow q 3 month surveillance for repeat testing in future Follow-up refer to ID in future if needed or further counseling/treatment is indicated  If any cost issue or coverage may consider Truvada and monitor renal function closely, otherwise can consider financial assistance if qualifies    Meds ordered this encounter  Medications  . DESCOVY 200-25 MG tablet    Sig: Take 1 tablet by mouth daily.    Dispense:  30 tablet    Refill:  2    Follow-up: - Return in 1 week for labs, then q 3 mo  Patient verbalizes understanding with the above medical recommendations including the limitation of remote medical advice.  Specific follow-up and call-back criteria were given for patient to follow-up or seek medical care more urgently if needed.   - Time spent in direct consultation with patient on phone: 17 minutes  [name], DO [facility] 08/24/2018, 1:57 PM

## 2018-08-27 ENCOUNTER — Telehealth: Payer: Self-pay

## 2018-08-27 ENCOUNTER — Ambulatory Visit: Payer: Self-pay | Admitting: Pharmacist

## 2018-08-27 ENCOUNTER — Other Ambulatory Visit (HOSPITAL_COMMUNITY)
Admission: RE | Admit: 2018-08-27 | Discharge: 2018-08-27 | Disposition: A | Discharge status: Home / Self Care | Payer: Medicare Other | Source: Ambulatory Visit | Attending: Family Medicine | Admitting: Family Medicine

## 2018-08-27 ENCOUNTER — Other Ambulatory Visit: Payer: BLUE CROSS/BLUE SHIELD

## 2018-08-27 DIAGNOSIS — Z7252 High risk homosexual behavior: Secondary | ICD-10-CM

## 2018-08-27 NOTE — Chronic Care Management (AMB) (Signed)
  Chronic Care Management   Follow Up Note   08/27/2018 Name: Tony Franco MRN: [id number] DOB: 12-Mar-1950  Referred by: Olin Hauser, DO Reason for referral : Chronic Care Management (Initial Patient Outreach)   Tony Franco is a 68 y.o. year old male who is a primary care patient of Olin Hauser, DO. The CCM team was consulted for assistance with chronic disease management and care coordination needs, particularly for assistance with cost of Descovy.  I reached out to Zigmund Daniel by phone today. [name] reports that he currently has Part D coverage through [place] and that when he went to have the Descovy filled at his local pharmacy, the cost was over $700. Counsel patient regarding Medicare Part D health plan benefit. Counsel patient regarding medication assistance options available.  Note [employer] fund for HIV Treatment and Prevention is currently closed.  [name] denies meeting the income eligibility requirement for other medication assistance programs including Patient [place].   Advise [name] to follow up directly with his Part D plan to determine what portion of this current cost for Descovy is going toward his deductible versus a copayment.  Patient also asks for resources to help him determine whether he should enroll in an alternative Medicare Part D plan in the future. Advise patient to make an appointment with his local [facility]) counselor.  [name] denies further medication questions/concerns at this time or further need for follow up from [facility] Pharmacist at this time.   Plan  The patient has been provided with contact information for the care management team and has been advised to call with any health related questions or concerns.   Harlow Asa, PharmD, [facility] [employer] [phone]

## 2018-08-27 NOTE — Addendum Note (Signed)
Addended by: Olin Hauser on: 08/27/2018 09:10 AM   Modules accepted: Orders

## 2018-08-27 NOTE — Telephone Encounter (Signed)
Patient reported that the Descovy that was just prescribed was $746 with insurance.  Please sent something new to [facility] and notify patient.

## 2018-08-27 NOTE — Telephone Encounter (Signed)
Called patient discussed this issue, see below  Referral to [facility] for assistance in exploring financial assistance options for Descovy for PrEP HIV prevention therapy through [place] in setting of patient with Medicare A &B and [employer] Drug Plan, [employer] health backup ins plan, no longer on BCBS, pharmacy said his price is $700, original cost was $2500 approx, but he still cannot afford the $700 reduced price. He will look into alternative rx coverage. But is interested in any manufacturer discount.  Discussed case with Harlow Asa [facility] as well. She will look into ID pharmacy options and info from [place].  Advised if no other alternative way to reduce cost, he may continue testing and safe sex practices and not start this med.  [name], [facility] 08/27/2018, 2:18 PM

## 2018-08-28 LAB — COMPLETE METABOLIC PANEL WITH GFR
AG Ratio: 1.7 (calc) (ref 1.0–2.5)
ALT: 14 U/L (ref 9–46)
AST: 13 U/L (ref 10–35)
Albumin: 4.1 g/dL (ref 3.6–5.1)
Alkaline phosphatase (APISO): 71 U/L (ref 35–144)
BUN: 15 mg/dL (ref 7–25)
CO2: 23 mmol/L (ref 20–32)
Calcium: 9.4 mg/dL (ref 8.6–10.3)
Chloride: 109 mmol/L (ref 98–110)
Creat: 0.92 mg/dL (ref 0.70–1.25)
GFR, Est African American: 101 mL/min/{1.73_m2} (ref >=60)
GFR, Est Non African American: 87 mL/min/{1.73_m2} (ref >=60)
Globulin: 2.4 g/dL (calc) (ref 1.9–3.7)
Glucose, Bld: 93 mg/dL (ref 65–99)
Potassium: 4 mmol/L (ref 3.5–5.3)
Sodium: 144 mmol/L (ref 135–146)
Total Bilirubin: 0.7 mg/dL (ref 0.2–1.2)
Total Protein: 6.5 g/dL (ref 6.1–8.1)

## 2018-08-28 LAB — CYTOLOGY, (ORAL, ANAL, URETHRAL) ANCILLARY ONLY
Chlamydia: NEGATIVE
Chlamydia: NEGATIVE
Neisseria Gonorrhea: NEGATIVE
Neisseria Gonorrhea: NEGATIVE

## 2018-08-28 LAB — HEPATITIS PANEL, ACUTE
Hep A IgM: NONREACTIVE
Hep B C IgM: NONREACTIVE
Hepatitis B Surface Ag: NONREACTIVE
Hepatitis C Ab: NONREACTIVE
SIGNAL TO CUT-OFF: 0.01 (ref <1.00)

## 2018-08-28 LAB — GC/CHLAMYDIA PROBE AMP (~~LOC~~) NOT AT ARMC
Chlamydia: NEGATIVE
Neisseria Gonorrhea: NEGATIVE

## 2018-08-28 LAB — RPR: RPR Ser Ql: NONREACTIVE

## 2018-08-28 LAB — HIV ANTIBODY (ROUTINE TESTING W REFLEX): HIV 1&2 Ab, 4th Generation: NONREACTIVE

## 2018-11-18 ENCOUNTER — Emergency Department
Admission: EM | Admit: 2018-11-18 | Discharge: 2018-11-18 | Disposition: A | Discharge status: Home / Self Care | Payer: Medicare Other | Attending: Emergency Medicine | Admitting: Emergency Medicine

## 2018-11-18 ENCOUNTER — Other Ambulatory Visit: Payer: Self-pay

## 2018-11-18 ENCOUNTER — Emergency Department: Payer: Medicare Other

## 2018-11-18 DIAGNOSIS — R5381 Other malaise: Secondary | ICD-10-CM | POA: Diagnosis not present

## 2018-11-18 DIAGNOSIS — R112 Nausea with vomiting, unspecified: Secondary | ICD-10-CM

## 2018-11-18 DIAGNOSIS — R5383 Other fatigue: Secondary | ICD-10-CM | POA: Insufficient documentation

## 2018-11-18 DIAGNOSIS — Z79899 Other long term (current) drug therapy: Secondary | ICD-10-CM | POA: Insufficient documentation

## 2018-11-18 DIAGNOSIS — Z20828 Contact with and (suspected) exposure to other viral communicable diseases: Secondary | ICD-10-CM | POA: Insufficient documentation

## 2018-11-18 DIAGNOSIS — E782 Mixed hyperlipidemia: Secondary | ICD-10-CM | POA: Insufficient documentation

## 2018-11-18 DIAGNOSIS — R531 Weakness: Secondary | ICD-10-CM | POA: Diagnosis present

## 2018-11-18 LAB — URINALYSIS, COMPLETE (UACMP) WITH MICROSCOPIC
Bacteria, UA: NONE SEEN
Bilirubin Urine: NEGATIVE
Glucose, UA: NEGATIVE mg/dL
Hgb urine dipstick: NEGATIVE
Ketones, ur: 20 mg/dL — AB
Leukocytes,Ua: NEGATIVE
Nitrite: NEGATIVE
Protein, ur: NEGATIVE mg/dL
Specific Gravity, Urine: 1.02 (ref 1.005–1.030)
pH: 5 (ref 5.0–8.0)

## 2018-11-18 LAB — HEPATIC FUNCTION PANEL
ALT: 20 U/L (ref 0–44)
AST: 19 U/L (ref 15–41)
Albumin: 4.4 g/dL (ref 3.5–5.0)
Alkaline Phosphatase: 73 U/L (ref 38–126)
Bilirubin, Direct: 0.1 mg/dL (ref 0.0–0.2)
Indirect Bilirubin: 1.3 mg/dL — ABNORMAL HIGH (ref 0.3–0.9)
Total Bilirubin: 1.4 mg/dL — ABNORMAL HIGH (ref 0.3–1.2)
Total Protein: 7.2 g/dL (ref 6.5–8.1)

## 2018-11-18 LAB — CBC
HCT: 44.4 % (ref 39.0–52.0)
Hemoglobin: 14.8 g/dL (ref 13.0–17.0)
MCH: 29.1 pg (ref 26.0–34.0)
MCHC: 33.3 g/dL (ref 30.0–36.0)
MCV: 87.4 fL (ref 80.0–100.0)
Platelets: 249 10*3/uL (ref 150–400)
RBC: 5.08 MIL/uL (ref 4.22–5.81)
RDW: 12.8 % (ref 11.5–15.5)
WBC: 7.3 10*3/uL (ref 4.0–10.5)
nRBC: 0 % (ref 0.0–0.2)

## 2018-11-18 LAB — LIPASE, BLOOD: Lipase: 22 U/L (ref 11–51)

## 2018-11-18 LAB — BASIC METABOLIC PANEL
Anion gap: 10 (ref 5–15)
BUN: 14 mg/dL (ref 8–23)
CO2: 24 mmol/L (ref 22–32)
Calcium: 9.1 mg/dL (ref 8.9–10.3)
Chloride: 106 mmol/L (ref 98–111)
Creatinine, Ser: 0.77 mg/dL (ref 0.61–1.24)
GFR calc Af Amer: >60 mL/min (ref >60)
GFR calc non Af Amer: >60 mL/min (ref >60)
Glucose, Bld: 89 mg/dL (ref 70–99)
Potassium: 3.5 mmol/L (ref 3.5–5.1)
Sodium: 140 mmol/L (ref 135–145)

## 2018-11-18 MED ORDER — ONDANSETRON HCL 4 MG/2ML IJ SOLN
4.0000 mg | Freq: Once | INTRAMUSCULAR | Status: AC
  Administered 2018-11-18: 4 mg via INTRAVENOUS
  Filled 2018-11-18: qty 2

## 2018-11-18 MED ORDER — ONDANSETRON 4 MG PO TBDP: 4.0000 mg | ORAL_TABLET | Freq: Three times a day (TID) | ORAL | 0 refills | Status: DC | PRN

## 2018-11-18 MED ORDER — LACTATED RINGERS IV BOLUS
1000.0000 mL | Freq: Once | INTRAVENOUS | Status: AC
  Administered 2018-11-18: 1000 mL via INTRAVENOUS

## 2018-11-18 NOTE — ED Notes (Signed)
Pt given blanket.

## 2018-11-18 NOTE — ED Notes (Signed)
Pt ambulatory to treatment room- pt in NAD at this time

## 2018-11-18 NOTE — ED Notes (Signed)
Dr Jessup at bedside 

## 2018-11-18 NOTE — ED Provider Notes (Signed)
[facility] Emergency Department Provider Note   ____________________________________________   First MD Initiated Contact with Patient 11/18/18 1535     (approximate)  I have reviewed the triage vital signs and the nursing notes.   HISTORY  Chief Complaint Weakness and Emesis    HPI Tony Franco is a 68 y.o. male with no significant past medical history who presents to the ED complaining of nausea and weakness.  Patient reports that since last night he has been feeling generally weak and malaised with nausea.  He states he vomited multiple times overnight but has not vomited again throughout the day today.  Emesis was nonbilious and nonbloody.  He denies any associated fevers, chills, cough, chest pain, shortness of breath, diarrhea.  He does state that he had difficulty urinating earlier today, but when he urinated more recently had seem to be normal.  He does admit to smoking marijuana for the first time last night, denies any other drug use.        Past Medical History:  Diagnosis Date  . Depression     Patient Active Problem List   Diagnosis Date Noted  . BPH with obstruction/lower urinary tract symptoms 06/22/2017  . Mixed hyperlipidemia 06/20/2017  . Mild episode of recurrent major depressive disorder ([place]) 08/04/2016  . Right lateral epicondylitis 08/04/2016  . Screening for prostate cancer 08/04/2016  . Osteoarthritis of knees, bilateral 02/11/2016  . ED (erectile dysfunction) 04/20/2015  . Insomnia 04/20/2015  . Paresthesia 03/16/2015  . RLS (restless legs syndrome) 03/16/2015  . Colon cancer screening 03/16/2015    Past Surgical History:  Procedure Laterality Date  . COLONOSCOPY WITH PROPOFOL N/A 04/28/2015   Procedure: COLONOSCOPY WITH PROPOFOL;  Surgeon: Christene Lye, MD;  Location: [facility] ENDOSCOPY;  Service: Endoscopy;  Laterality: N/A;  . KNEE ARTHROSCOPY     left knee    Prior to Admission medications   Medication  Sig Start Date End Date Taking? Authorizing Provider  albuterol (PROAIR HFA) 108 (90 Base) MCG/ACT inhaler Inhale 1-2 puffs into the lungs every 6 (six) hours as needed for wheezing or shortness of breath. Patient not taking: Reported on 08/24/2018 02/19/18   Laverle Hobby, MD  albuterol (PROVENTIL HFA;VENTOLIN HFA) 108 (90 Base) MCG/ACT inhaler Inhale 2 puffs into the lungs every 6 (six) hours as needed for wheezing or shortness of breath (cough). Patient not taking: Reported on 08/24/2018 01/15/18   Olin Hauser, DO  beclomethasone (QVAR REDIHALER) 80 MCG/ACT inhaler Inhale 2 puffs into the lungs 2 (two) times daily. Rinse mouth after use. Patient not taking: Reported on 08/24/2018 02/19/18 02/19/19  Laverle Hobby, MD  DESCOVY 200-25 MG tablet Take 1 tablet by mouth daily. 08/24/18   Karamalegos, Devonne Doughty, DO  montelukast (SINGULAIR) 10 MG tablet Take 1 tablet (10 mg total) by mouth at bedtime. Patient not taking: Reported on 08/24/2018 02/19/18   Laverle Hobby, MD  montelukast (SINGULAIR) 10 MG tablet Take 1 tablet (10 mg total) by mouth at bedtime. Patient not taking: Reported on 08/24/2018 02/19/18   Laverle Hobby, MD  ondansetron (ZOFRAN ODT) 4 MG disintegrating tablet Take 1 tablet (4 mg total) by mouth every 8 (eight) hours as needed for nausea or vomiting. 11/18/18   Blake Divine, MD  traZODone (DESYREL) 50 MG tablet Take 1 tablet (50 mg total) by mouth at bedtime. Patient not taking: Reported on 08/24/2018 06/22/17   Olin Hauser, DO    Allergies Patient has no known allergies.  Family History  Problem Relation Age of Onset  . Heart attack Mother   . Heart attack Father     Social History Social History   Tobacco Use  . Smoking status: Never Smoker  . Smokeless tobacco: Never Used  Substance Use Topics  . Alcohol use: Yes    Alcohol/week: 1.0 standard drinks    Types: 1 Glasses of wine per week    Frequency: Never  . Drug  use: No    Review of Systems  Constitutional: No fever/chills.  Positive for generalized weakness and malaise. Eyes: No visual changes. ENT: No sore throat. Cardiovascular: Denies chest pain. Respiratory: Denies shortness of breath. Gastrointestinal: No abdominal pain.  Positive for nausea, no vomiting.  No diarrhea.  No constipation. Genitourinary: Negative for dysuria. Musculoskeletal: Negative for back pain. Skin: Negative for rash. Neurological: Negative for headaches, focal weakness or numbness.  ____________________________________________   PHYSICAL EXAM:  VITAL SIGNS: ED Triage Vitals  Enc Vitals Group     BP 11/18/18 1332 127/82     Pulse Rate 11/18/18 1332 81     Resp 11/18/18 1332 18     Temp 11/18/18 1332 98.4 F (36.9 C)     Temp Source 11/18/18 1332 Oral     SpO2 11/18/18 1332 97 %     Weight 11/18/18 1333 185 lb (83.9 kg)     Height 11/18/18 1333 6' (1.829 m)     Head Circumference --      Peak Flow --      Pain Score 11/18/18 1333 0     Pain Loc --      Pain Edu? --      Excl. in [place]? --     Constitutional: Alert and oriented. Eyes: Conjunctivae are normal. Head: Atraumatic. Nose: No congestion/rhinnorhea. Mouth/Throat: Mucous membranes are moist. Neck: Normal ROM Cardiovascular: Normal rate, regular rhythm. Grossly normal heart sounds. Respiratory: Normal respiratory effort.  No retractions. Lungs CTAB. Gastrointestinal: Soft and nontender. No distention. Genitourinary: deferred Musculoskeletal: No lower extremity tenderness nor edema. Neurologic:  Normal speech and language. No gross focal neurologic deficits are appreciated. Skin:  Skin is warm, dry and intact. No rash noted. Psychiatric: Mood and affect are normal. Speech and behavior are normal.  ____________________________________________   LABS (all labs ordered are listed, but only abnormal results are displayed)  Labs Reviewed  URINALYSIS, COMPLETE (UACMP) WITH MICROSCOPIC -  Abnormal; Notable for the following components:      Result Value   Color, Urine YELLOW (*)    APPearance HAZY (*)    Ketones, ur 20 (*)    All other components within normal limits  HEPATIC FUNCTION PANEL - Abnormal; Notable for the following components:   Total Bilirubin 1.4 (*)    Indirect Bilirubin 1.3 (*)    All other components within normal limits  NOVEL CORONAVIRUS, NAA (HOSP ORDER, SEND-OUT TO REF LAB; TAT 18-24 HRS)  BASIC METABOLIC PANEL  CBC  LIPASE, BLOOD   ____________________________________________  EKG  ED ECG REPORT I, Blake Divine, the attending physician, personally viewed and interpreted this ECG.   Date: 11/18/2018  EKG Time: 13:35  Rate: 78  Rhythm: normal sinus rhythm  Axis: Normal  Intervals:none  ST&T Change: None    PROCEDURES  Procedure(s) performed (including Critical Care):  Procedures   ____________________________________________   INITIAL IMPRESSION / ASSESSMENT AND PLAN / ED COURSE       68 year old male with no significant past medical history presents to the ED with nausea, malaise, and generalized weakness  since last night, when he smoked marijuana for the first time.  He denies any associated abdominal pain and has a benign abdominal exam.  Labs are thus far reassuring, will add on LFTs and lipase.  UA is unremarkable, will screen chest x-ray.  Will treat with IV fluids and Zofran.  Doubt cardiac etiology as EKG is unremarkable.  Chest x-ray shows evidence of reactive airway disease versus atypical infectious process.  Relatively low suspicion for infectious process given patient's lack of fever or cough, however will check COVID test.  Remainder of labs unremarkable and patient reports symptoms are improved.  Counseled patient to follow-up with PCP and return to the ED for new or worsening symptoms, patient agrees with plan.      ____________________________________________   FINAL CLINICAL IMPRESSION(S) / ED DIAGNOSES   Final diagnoses:  Non-intractable vomiting with nausea, unspecified vomiting type  Malaise and fatigue     ED Discharge Orders         Ordered    ondansetron (ZOFRAN ODT) 4 MG disintegrating tablet  Every 8 hours PRN     11/18/18 1813           Note:  This document was prepared using Dragon voice recognition software and may include unintentional dictation errors.   Blake Divine, MD 11/19/18 [phone]

## 2018-11-18 NOTE — ED Triage Notes (Addendum)
Pt reports vomiting yesterday evening, states approx 10 episodes, states no diarrhea, pt reports smoking weed for the first time ever last pm around 2200 and got sick after, reports finished at 0100, states feeling weak today with some bilat lip tingling that only lasted a few minutes. Pt also reports dysuria. Pt concerned due to his age. No distress noted at this time, pt ambulatory to triage without difficulty and denied needing a w/c pt asking about getting covid tested

## 2018-11-20 LAB — NOVEL CORONAVIRUS, NAA (HOSP ORDER, SEND-OUT TO REF LAB; TAT 18-24 HRS): SARS-CoV-2, NAA: NOT DETECTED

## 2018-11-22 ENCOUNTER — Encounter: Payer: Self-pay | Admitting: Family Medicine

## 2018-11-22 ENCOUNTER — Other Ambulatory Visit: Payer: Self-pay

## 2018-11-22 ENCOUNTER — Ambulatory Visit (INDEPENDENT_AMBULATORY_CARE_PROVIDER_SITE_OTHER): Payer: Medicare Other | Admitting: Family Medicine

## 2018-11-22 DIAGNOSIS — J4521 Mild intermittent asthma with (acute) exacerbation: Secondary | ICD-10-CM | POA: Diagnosis not present

## 2018-11-22 DIAGNOSIS — J3089 Other allergic rhinitis: Secondary | ICD-10-CM | POA: Diagnosis not present

## 2018-11-22 DIAGNOSIS — R0982 Postnasal drip: Secondary | ICD-10-CM | POA: Diagnosis not present

## 2018-11-22 DIAGNOSIS — N401 Enlarged prostate with lower urinary tract symptoms: Secondary | ICD-10-CM

## 2018-11-22 DIAGNOSIS — N138 Other obstructive and reflux uropathy: Secondary | ICD-10-CM

## 2018-11-22 MED ORDER — PREDNISONE 50 MG PO TABS: 50.0000 mg | ORAL_TABLET | Freq: Every day | ORAL | 0 refills | Status: DC

## 2018-11-22 MED ORDER — TAMSULOSIN HCL 0.4 MG PO CAPS: 0.4000 mg | ORAL_CAPSULE | Freq: Every day | ORAL | 2 refills | Status: DC

## 2018-11-22 MED ORDER — FLUTICASONE PROPIONATE 50 MCG/ACT NA SUSP: 2.0000 | Freq: Every day | NASAL | 2 refills | Status: DC

## 2018-11-22 NOTE — Progress Notes (Signed)
Virtual Visit via Telephone The purpose of this virtual visit is to provide medical care while limiting exposure to the novel coronavirus (COVID19) for both patient and office staff.  Consent was obtained for phone visit:  Yes.   Answered questions that patient had about telehealth interaction:  Yes.   I discussed the limitations, risks, security and privacy concerns of performing an evaluation and management service by telephone. I also discussed with the patient that there may be a patient responsible charge related to this service. The patient expressed understanding and agreed to proceed.  Patient Location: Home Provider Location: [facility])  ---------------------------------------------------------------------- Chief Complaint  Patient presents with  . Nausea    jaw pain, back pain left side onset 4 days     S: Reviewed CMA documentation. I have called patient and gathered additional HPI as follows:   ED FOLLOW-UP VISIT  Hospital/Location: [facility] Date of ED Visit: 11/18/18  Reason for Presenting to ED: Weakness, Nausea Vomiting Primary (+Secondary) Diagnosis: Nausea Vomiting, Dehydration, Weakness  FOLLOW-UP - ED provider note and record have been reviewed - Patient presents today about 4 days after recent ED visit. Brief summary of recent course, patient had symptoms of nausea vomiting following used marijuana for first time on Saturday, seems to think it precipitated nausea vomiting Saturday 11/17/18 PM, poor night did not sleep well, felt bad next day, went to hospital on 11/18/18. Also he was in [place] 1 week previous and states that the air was causing his lungs to burn. History of asthma, previously saw Pulmonology in early 02/2018 had PFT dx with asthma, not COPD. On Qvar, Albuterol, Singulair, has stopped singulair. Testing in ED with labs, UA, given IVF, zofran, CHest x-ray see below showed some peribronchial cuffing. COVID19 test was  negative.  - Today reports overall has done fairly well after discharge from ED. Symptoms of nausea vomiting have improved on medicine, still persistent but no further significant vomiting, does have nausea, gradually improving. He has dry cough irritation and some dyspnea at times, not using inhalers regularly, but can resume. He is off singulair as well. - Additional complaint with chronic post nasal drip, causing some nausea as well Seems episodic, can be worse at times with flare up and triggering nausea He says decades ago had a "boil in back of throat" had it drained before but he is not sure if this is present now. Exam from EDP did not indicate this. - Took some claritin recently OTC   - New medications on discharge: Zofran - Changes to current meds on discharge: none  Additional problems Suspected BPH with LUTS Previously discussed in office in 06/2017 - see prior note. AUA BPH score 19 but declined treatment Difficulty draining bladder as much as he describes, nocturia, start/stop, frequency see below. No prior clear dx of BPH Last DRE 2 year ago  AUA BPH Symptom Score over past 1 month 1. Sensation of not emptying bladder post void - 4 2. Urinate less than 2 hour after finish last void - 3 3. Start/Stop several times during void - 5 4. Difficult to postpone urination - 0 5. Weak urinary stream - 1 6. Push or strain urination - 5 7. Nocturia - 1-2 times  Total Score: 19 (Moderate BPH symptoms)   I have reviewed the discharge medication list, and have reconciled the current and discharge medications today.  Denies any fevers, chills, sweats, body ache, headache, abdominal pain, diarrhea  Past Medical History:  Diagnosis Date  .  Depression    Social History   Tobacco Use  . Smoking status: Never Smoker  . Smokeless tobacco: Never Used  Substance Use Topics  . Alcohol use: Yes    Alcohol/week: 1.0 standard drinks    Types: 1 Glasses of wine per week     Frequency: Never  . Drug use: Yes    Types: Marijuana    Current Outpatient Medications:  .  albuterol (PROAIR HFA) 108 (90 Base) MCG/ACT inhaler, Inhale 1-2 puffs into the lungs every 6 (six) hours as needed for wheezing or shortness of breath., Disp: 1 Inhaler, Rfl: 5 .  albuterol (PROVENTIL HFA;VENTOLIN HFA) 108 (90 Base) MCG/ACT inhaler, Inhale 2 puffs into the lungs every 6 (six) hours as needed for wheezing or shortness of breath (cough)., Disp: 1 Inhaler, Rfl: 2 .  beclomethasone (QVAR REDIHALER) 80 MCG/ACT inhaler, Inhale 2 puffs into the lungs 2 (two) times daily. Rinse mouth after use., Disp: 1 Inhaler, Rfl: 2 .  ondansetron (ZOFRAN ODT) 4 MG disintegrating tablet, Take 1 tablet (4 mg total) by mouth every 8 (eight) hours as needed for nausea or vomiting., Disp: 12 tablet, Rfl: 0 .  fluticasone (FLONASE) 50 MCG/ACT nasal spray, Place 2 sprays into both nostrils daily. Use for 4-6 weeks then stop and use seasonally or as needed., Disp: 16 g, Rfl: 2 .  predniSONE (DELTASONE) 50 MG tablet, Take 1 tablet (50 mg total) by mouth daily with breakfast., Disp: 5 tablet, Rfl: 0 .  tamsulosin (FLOMAX) 0.4 MG CAPS capsule, Take 1 capsule (0.4 mg total) by mouth daily., Disp: 30 capsule, Rfl: 2 .  traZODone (DESYREL) 50 MG tablet, Take 1 tablet (50 mg total) by mouth at bedtime. (Patient not taking: Reported on 08/24/2018), Disp: 90 tablet, Rfl: 3  Depression screen [facility] 2/9 11/22/2018 08/24/2018 01/15/2018  Decreased Interest 0 0 0  Down, Depressed, Hopeless 0 0 0  PHQ - 2 Score 0 0 0  Altered sleeping - 3 2  Tired, decreased energy - 0 1  Change in appetite - 0 0  Feeling bad or failure about yourself  - 0 0  Trouble concentrating - 0 0  Moving slowly or fidgety/restless - 0 0  Suicidal thoughts - 0 0  PHQ-9 Score - 3 3  Difficult doing work/chores - Somewhat difficult Not difficult at all    GAD 7 : Generalized Anxiety Score 07/28/2017  Nervous, Anxious, on Edge 2  Control/stop worrying 3   Worry too much - different things 2  Trouble relaxing 3  Restless 0  Easily annoyed or irritable 2  Afraid - awful might happen 3  Total GAD 7 Score 15  Anxiety Difficulty Extremely difficult    -------------------------------------------------------------------------- O: No physical exam performed due to remote telephone encounter.  Lab results reviewed.  DG Chest Portable 1 ViewPerformed 11/18/2018 Final result  Study Result CLINICAL DATA: Cough and weakness. Per ER notes: Pt reports vomiting yesterday evening, states approx 10 episodes, states no diarrhea, pt reports smoking weed for the first time ever last pm around 2200 and got sick after, reports finished at 0100.  EXAM: PORTABLE CHEST 1 VIEW  COMPARISON: Chest radiograph 07/28/2017  FINDINGS: The heart size and mediastinal contours are within normal limits. No focal pulmonary opacity. There is mild peribronchial thickening bilaterally which could represent reactive airways disease or atypical infection in the appropriate setting. No pneumothorax or large pleural effusion. The visualized skeletal structures are unremarkable.  IMPRESSION: Mild peribronchial thickening bilaterally which could represent reactive airways  disease or atypical/viral infection in the appropriate clinical setting. No focal airspace consolidation.   Electronically Signed By: Audie Pinto M.D. On: 11/18/2018 17:07    Recent Results (from the past 2160 hour(s))  GC/Chlamydia probe amp ([place])not at [facility]     Status: None   Collection Time: 08/27/18 12:00 AM  Result Value Ref Range   Chlamydia Negative     Comment: Normal Reference Range - Negative   Neisseria Gonorrhea Negative     Comment: Normal Reference Range - Negative  Cytology (oral, anal, urethral) ancillary only     Status: None   Collection Time: 08/27/18 12:00 AM  Result Value Ref Range   Chlamydia Negative     Comment: Normal Reference Range - Negative    Neisseria Gonorrhea Negative     Comment: Normal Reference Range - Negative  Cytology (oral, anal, urethral) ancillary only     Status: None   Collection Time: 08/27/18 12:00 AM  Result Value Ref Range   Chlamydia Negative     Comment: Normal Reference Range - Negative   Neisseria Gonorrhea Negative     Comment: Normal Reference Range - Negative  COMPLETE METABOLIC PANEL WITH GFR     Status: None   Collection Time: 08/27/18  8:49 AM  Result Value Ref Range   Glucose, Bld 93 65 - 99 mg/dL    Comment: .            Fasting reference interval .    BUN 15 7 - 25 mg/dL   Creat 0.92 0.70 - 1.25 mg/dL    Comment: For patients >79 years of age, the reference limit for Creatinine is approximately 13% higher for people identified as African-American. .    GFR, Est Non African American 87 > OR = 60 mL/min/1.16m2   GFR, Est African American 101 > OR = 60 mL/min/1.41m2   BUN/Creatinine Ratio NOT APPLICABLE 6 - 22 (calc)   Sodium 144 135 - 146 mmol/L   Potassium 4.0 3.5 - 5.3 mmol/L   Chloride 109 98 - 110 mmol/L   CO2 23 20 - 32 mmol/L   Calcium 9.4 8.6 - 10.3 mg/dL   Total Protein 6.5 6.1 - 8.1 g/dL   Albumin 4.1 3.6 - 5.1 g/dL   Globulin 2.4 1.9 - 3.7 g/dL (calc)   AG Ratio 1.7 1.0 - 2.5 (calc)   Total Bilirubin 0.7 0.2 - 1.2 mg/dL   Alkaline phosphatase (APISO) 71 35 - 144 U/L   AST 13 10 - 35 U/L   ALT 14 9 - 46 U/L  RPR     Status: None   Collection Time: 08/27/18  8:49 AM  Result Value Ref Range   RPR Ser Ql NON-REACTIVE NON-REACTI  Hepatitis, Acute     Status: None   Collection Time: 08/27/18  8:49 AM  Result Value Ref Range   Hep A IgM NON-REACTIVE NON-REACTI   Hepatitis B Surface Ag NON-REACTIVE NON-REACTI   Hep B C IgM NON-REACTIVE NON-REACTI   Hepatitis C Ab NON-REACTIVE NON-REACTI   SIGNAL TO CUT-OFF 0.01 <1.00    Comment: . HCV antibody was non-reactive. There is no laboratory  evidence of HCV infection. . In most cases, no further action is required.  However, if recent HCV exposure is suspected, a test for HCV RNA (test code [id number]) is suggested. . For additional information please refer to http://education.questdiagnostics.com/faq/FAQ22v1 (This link is being provided for informational/ educational purposes only.) . Marland Kitchen For additional information, please refer to  http://education.questdiagnostics.com/faq/FAQ202  (  This link is being provided for informational/ educational purposes only.) .   HIV Antibody (routine testing w rflx)     Status: None   Collection Time: 08/27/18  8:49 AM  Result Value Ref Range   HIV 1&2 Ab, 4th Generation NON-REACTIVE NON-REACTI    Comment: HIV-1 antigen and HIV-1/HIV-2 antibodies were not detected. There is no laboratory evidence of HIV infection. Marland Kitchen PLEASE NOTE: This information has been disclosed to you from records whose confidentiality may be protected by state law.  If your state requires such protection, then the state law prohibits you from making any further disclosure of the information without the specific written consent of the person to whom it pertains, or as otherwise permitted by law. A general authorization for the release of medical or other information is NOT sufficient for this purpose. . For additional information please refer to http://education.questdiagnostics.com/faq/FAQ106 (This link is being provided for informational/ educational purposes only.) . Marland Kitchen The performance of this assay has not been clinically validated in patients less than 25 years old. .   Basic metabolic panel     Status: None   Collection Time: 11/18/18  1:39 PM  Result Value Ref Range   Sodium 140 135 - 145 mmol/L   Potassium 3.5 3.5 - 5.1 mmol/L   Chloride 106 98 - 111 mmol/L   CO2 24 22 - 32 mmol/L   Glucose, Bld 89 70 - 99 mg/dL   BUN 14 8 - 23 mg/dL   Creatinine, Ser 0.77 0.61 - 1.24 mg/dL   Calcium 9.1 8.9 - 10.3 mg/dL   GFR calc non Af Amer >60 >60 mL/min   GFR calc Af Amer >60 >60  mL/min   Anion gap 10 5 - 15    Comment: Performed at [facility], [address] [place], [place] [address]  CBC     Status: None   Collection Time: 11/18/18  1:39 PM  Result Value Ref Range   WBC 7.3 4.0 - 10.5 K/uL   RBC 5.08 4.22 - 5.81 MIL/uL   Hemoglobin 14.8 13.0 - 17.0 g/dL   HCT 44.4 39.0 - 52.0 %   MCV 87.4 80.0 - 100.0 fL   MCH 29.1 26.0 - 34.0 pg   MCHC 33.3 30.0 - 36.0 g/dL   RDW 12.8 11.5 - 15.5 %   Platelets 249 150 - 400 K/uL   nRBC 0.0 0.0 - 0.2 %    Comment: Performed at [facility], [address] [place], [place] [address]  Urinalysis, Complete w Microscopic     Status: Abnormal   Collection Time: 11/18/18  1:39 PM  Result Value Ref Range   Color, Urine YELLOW (A) YELLOW   APPearance HAZY (A) CLEAR   Specific Gravity, Urine 1.020 1.005 - 1.030   pH 5.0 5.0 - 8.0   Glucose, UA NEGATIVE NEGATIVE mg/dL   Hgb urine dipstick NEGATIVE NEGATIVE   Bilirubin Urine NEGATIVE NEGATIVE   Ketones, ur 20 (A) NEGATIVE mg/dL   Protein, ur NEGATIVE NEGATIVE mg/dL   Nitrite NEGATIVE NEGATIVE   Leukocytes,Ua NEGATIVE NEGATIVE   RBC / HPF 0-5 0 - 5 RBC/hpf   WBC, UA 6-10 0 - 5 WBC/hpf   Bacteria, UA NONE SEEN NONE SEEN   Squamous Epithelial / LPF 0-5 0 - 5   Mucus PRESENT     Comment: Performed at [facility], [address], [place], [place] [address]  Hepatic function panel     Status: Abnormal  Collection Time: 11/18/18  1:39 PM  Result Value Ref Range   Total Protein 7.2 6.5 - 8.1 g/dL   Albumin 4.4 3.5 - 5.0 g/dL   AST 19 15 - 41 U/L   ALT 20 0 - 44 U/L   Alkaline Phosphatase 73 38 - 126 U/L   Total Bilirubin 1.4 (H) 0.3 - 1.2 mg/dL   Bilirubin, Direct 0.1 0.0 - 0.2 mg/dL   Indirect Bilirubin 1.3 (H) 0.3 - 0.9 mg/dL    Comment: Performed at [facility], [address] [place], [place] [address]  Lipase, blood     Status: None   Collection Time: 11/18/18  1:39 PM  Result Value Ref Range   Lipase 22 11 - 51 U/L     Comment: Performed at [facility], [address], [place], [place] [address]  Novel Coronavirus, NAA (Hosp order, Send-out to Ref Lab; TAT 18-24 hrs     Status: None   Collection Time: 11/18/18  6:05 PM   Specimen: Nasopharyngeal Swab; Respiratory  Result Value Ref Range   SARS-CoV-2, NAA NOT DETECTED NOT DETECTED    Comment: (NOTE) This nucleic acid amplification test was developed and its performance characteristics determined by [employer]. Nucleic acid amplification tests include PCR and TMA. This test has not been FDA cleared or approved. This test has been authorized by FDA under an Emergency Use Authorization (EUA). This test is only authorized for the duration of time the declaration that circumstances exist justifying the authorization of the emergency use of in vitro diagnostic tests for detection of SARS-CoV-2 virus and/or diagnosis of COVID-19 infection under section 564(b)(1) of the Act, 21 U.S.C. GF:7541899) (1), unless the authorization is terminated or revoked sooner. When diagnostic testing is negative, the possibility of a false negative result should be considered in the context of a patient's recent exposures and the presence of clinical signs and symptoms consistent with COVID-19. An individual without symptoms of COVID- 19 and who is not shedding SARS-CoV-2 vi rus would expect to have a negative (not detected) result in this assay. Performed At: [facility] [place], [place] [id number] Rush Farmer MD [id number]    Coronavirus Source NASOPHARYNGEAL     Comment: Performed at [facility], [address] [place], [place] [address]    -------------------------------------------------------------------------- A&P:  Problem List Items Addressed This Visit    BPH with obstruction/lower urinary tract symptoms   Relevant Medications   tamsulosin (FLOMAX) 0.4 MG CAPS capsule    Other Visit Diagnoses     Mild intermittent asthma with exacerbation    -  Primary   Relevant Medications   predniSONE (DELTASONE) 50 MG tablet   Seasonal allergic rhinitis due to other allergic trigger       Relevant Medications   fluticasone (FLONASE) 50 MCG/ACT nasal spray   Post-nasal drainage       Relevant Medications   fluticasone (FLONASE) 50 MCG/ACT nasal spray     #Postnasal drainage / Allergic rhinitis Chronic problem Known allergies Classic description by history and similar to past History not suggestive of other oropharyngeal infection but cannot rule out - return criteria given if significant worsening Start nasal steroid Flonase 2 sprays in each nostril daily for 4-6 weeks, may repeat course seasonally or as needed  # Asthma, mild intermittent acute flare Clinically by history with some mild acute asthma exac symptoms, likely triggered by marijuana and also air in [place], allergies / nasal drainage - Start Flonase - Resume  Qvar daily - Resume Albuterol PRN if needed - Start Prednisone 50mg  daily x 5 days  #BPH Chronic problem, consistent previously, now persistent symptoms - AUA BPH score 19 (moderate 2019) Never on meds - No known personal/family history of prostate CA  Plan: 1. Start Tamsulosin 0.4mg  daily, advised on benefits, risks, if BP low caution with sudden standing up or position change F/u consider Urology if need  Meds ordered this encounter  Medications  . predniSONE (DELTASONE) 50 MG tablet    Sig: Take 1 tablet (50 mg total) by mouth daily with breakfast.    Dispense:  5 tablet    Refill:  0  . tamsulosin (FLOMAX) 0.4 MG CAPS capsule    Sig: Take 1 capsule (0.4 mg total) by mouth daily.    Dispense:  30 capsule    Refill:  2  . fluticasone (FLONASE) 50 MCG/ACT nasal spray    Sig: Place 2 sprays into both nostrils daily. Use for 4-6 weeks then stop and use seasonally or as needed.    Dispense:  16 g    Refill:  2    Follow-up: - Return in 1-2 weeks as needed  if not improved  Patient verbalizes understanding with the above medical recommendations including the limitation of remote medical advice.  Specific follow-up and call-back criteria were given for patient to follow-up or seek medical care more urgently if needed.   - Time spent in direct consultation with patient on phone: 15 minutes  [name], [facility] 11/22/2018, 10:33 AM

## 2018-11-22 NOTE — Patient Instructions (Addendum)
Prednisone for asthma flare For nasal drainage - Start nasal steroid Flonase 2 sprays in each nostril daily for 4-6 weeks, may repeat course seasonally or as needed  For enlarged prostate - start Tamsulosin (Flomax) 0.4mg  daily - caution when standing suddenly can get lightheaded. It should help urination symptoms. We can continue this longer term if beneficial. If need can refer to Urologist for 2nd opinion.  Follow up if worsening symptoms, or new concerns - as long as not fever, cough or active vomiting or other covid / viral flu symptoms we can consider seeing you in office next time.  Please schedule a Follow-up Appointment to: Return in about 1 week (around 11/29/2018), or if symptoms worsen or fail to improve.  If you have any other questions or concerns, please feel free to call the office or send a message through [place]. You may also schedule an earlier appointment if necessary.  Additionally, you may be receiving a survey about your experience at our office within a few days to 1 week by e-mail or mail. We value your feedback.  [name], DO [place]

## 2018-11-27 ENCOUNTER — Telehealth: Payer: Self-pay | Admitting: Family Medicine

## 2018-11-27 DIAGNOSIS — N522 Drug-induced erectile dysfunction: Secondary | ICD-10-CM

## 2018-11-27 DIAGNOSIS — F330 Major depressive disorder, recurrent, mild: Secondary | ICD-10-CM

## 2018-11-27 DIAGNOSIS — F5101 Primary insomnia: Secondary | ICD-10-CM

## 2018-11-27 MED ORDER — TRAZODONE HCL 50 MG PO TABS: 50.0000 mg | ORAL_TABLET | Freq: Every day | ORAL | 1 refills | Status: DC

## 2018-11-27 MED ORDER — SILDENAFIL CITRATE 20 MG PO TABS: ORAL_TABLET | ORAL | 2 refills | Status: DC

## 2018-11-27 NOTE — Telephone Encounter (Signed)
Pt called requesting Trazodone 50mg  , sildenafil 20 mg

## 2019-02-19 ENCOUNTER — Telehealth: Payer: Self-pay

## 2019-02-19 NOTE — Telephone Encounter (Signed)
The patient called complaining of mild coughing, intermittent headaches, neck pain, SOB mostly with exertion and fatigue. X 2 mths. The pt seems to think he might have had COVID earlier, but didn't get tested to confirm. He state his symptoms have improved, but persist. Appt scheduled for Thursday, Jan 14th. An earlier appt was offered and the patient declined. I informed the patient to seek emergent medical care if your symptoms worsen. He verbalize understanding.

## 2019-02-19 NOTE — Telephone Encounter (Signed)
Patient triaged, keep apt as scheduled 02/21/19  Tony Franco, [facility] 02/19/2019, 9:24 AM

## 2019-02-21 ENCOUNTER — Encounter: Payer: Self-pay | Admitting: Family Medicine

## 2019-02-21 ENCOUNTER — Ambulatory Visit (INDEPENDENT_AMBULATORY_CARE_PROVIDER_SITE_OTHER): Payer: Medicare Other | Admitting: Family Medicine

## 2019-02-21 ENCOUNTER — Other Ambulatory Visit: Payer: Self-pay | Admitting: Family Medicine

## 2019-02-21 ENCOUNTER — Other Ambulatory Visit: Payer: Self-pay

## 2019-02-21 DIAGNOSIS — F5101 Primary insomnia: Secondary | ICD-10-CM

## 2019-02-21 DIAGNOSIS — R519 Headache, unspecified: Secondary | ICD-10-CM

## 2019-02-21 DIAGNOSIS — J3089 Other allergic rhinitis: Secondary | ICD-10-CM | POA: Diagnosis not present

## 2019-02-21 DIAGNOSIS — J4521 Mild intermittent asthma with (acute) exacerbation: Secondary | ICD-10-CM | POA: Diagnosis not present

## 2019-02-21 DIAGNOSIS — F330 Major depressive disorder, recurrent, mild: Secondary | ICD-10-CM

## 2019-02-21 DIAGNOSIS — M47812 Spondylosis without myelopathy or radiculopathy, cervical region: Secondary | ICD-10-CM

## 2019-02-21 DIAGNOSIS — N138 Other obstructive and reflux uropathy: Secondary | ICD-10-CM

## 2019-02-21 DIAGNOSIS — Z202 Contact with and (suspected) exposure to infections with a predominantly sexual mode of transmission: Secondary | ICD-10-CM

## 2019-02-21 MED ORDER — PREDNISONE 50 MG PO TABS: 50.0000 mg | ORAL_TABLET | Freq: Every day | ORAL | 0 refills | Status: DC

## 2019-02-21 MED ORDER — IPRATROPIUM BROMIDE 0.06 % NA SOLN: 2.0000 | Freq: Four times a day (QID) | NASAL | 2 refills | Status: DC

## 2019-02-21 MED ORDER — FLUTICASONE-SALMETEROL 250-50 MCG/DOSE IN AEPB: 1.0000 | INHALATION_SPRAY | Freq: Two times a day (BID) | RESPIRATORY_TRACT | 3 refills | Status: DC

## 2019-02-21 MED ORDER — BACLOFEN 10 MG PO TABS: 5.0000 mg | ORAL_TABLET | Freq: Three times a day (TID) | ORAL | 1 refills | Status: DC | PRN

## 2019-02-21 MED ORDER — TAMSULOSIN HCL 0.4 MG PO CAPS: 0.4000 mg | ORAL_CAPSULE | Freq: Every day | ORAL | 1 refills | Status: DC

## 2019-02-21 MED ORDER — TRAZODONE HCL 50 MG PO TABS: 50.0000 mg | ORAL_TABLET | Freq: Every day | ORAL | 1 refills | Status: DC

## 2019-02-21 NOTE — Patient Instructions (Addendum)
Thank you for coming to the office today.  You last saw Dr Juanell Fairly in 02/2018. Please call to follow-up with a provider there for in person evaluation for Asthma / Chronic Cough.  [facility] [address] [place], [place] Phone: [phone]  Try advair generic inhaler for breathing.  Prednisone for 5 days  Start Atrovent nasal spray decongestant 2 sprays in each nostril up to 4 times daily for 7 days  Start taking Baclofen (Lioresal) 10mg  (muscle relaxant) - start with half (cut) to one whole pill at night as needed for next 1-3 nights (may make you drowsy, caution with driving) see how it affects you, then if tolerated increase to one pill 2 to 3 times a day or (every 8 hours as needed)  Recommend to start taking Tylenol Extra Strength 500mg  tabs - take 1 to 2 tabs per dose (max 1000mg ) every 6-8 hours for pain, mx 24 hour daily dose is 6 tablets or 3000mg .   [facility] - Call or schedule online to make apt to get tested for Syphilis RPR / HIV and they can treat as well.  You may have coronavirus / [place] Testing Information  COVID-19 Testing By Appointment Only  Indoor Test site now. No longer outdoor drive up testing.  Online scheduling can be done online at [email or web address] or by texting "COVID" to [phone].  Test result may take 2-7 days to result. You will be notified by MyChart or by Phone.  Phone: [phone] [facility] contact, can inquire about status of test result)  If negative test - they will call you with result. If abnormal or positive test you will be notified as well and our office will contact you to help further with treatment plan.  May take Tylenol as needed for aches pains and fever. Prefer to avoid Ibuprofen if can help it, to avoid complication from virus.  REQUIRED self quarantine to [place] - advised to avoid all exposure with  others while during treatment. Should continue to quarantine for up to 7-14 days, pending resolution of symptoms, if symptoms resolve by 7 days and is afebrile >3 days - may STOP self quarantine at that time.  If symptoms do not resolve or significantly improve OR if WORSENING - fever / cough - or worsening shortness of breath - then should contact us and seek advice on next steps in treatment at home vs where/when to seek care at Urgent Care or Hospital ED for further intervention   Please schedule a Follow-up Appointment to: Return in about 1 week (around 02/28/2019), or if symptoms worsen or fail to improve, for asthma, cough, headache if not improved.  If you have any other questions or concerns, please feel free to call the office or send a message through [place]. You may also schedule an earlier appointment if necessary.  Additionally, you may be receiving a survey about your experience at our office within a few days to 1 week by e-mail or mail. We value your feedback.  [name], DO [place]
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
## 2019-02-22 ENCOUNTER — Ambulatory Visit: Payer: Self-pay | Admitting: Family Medicine

## 2019-02-22 ENCOUNTER — Encounter: Payer: Self-pay | Admitting: Family Medicine

## 2019-02-22 DIAGNOSIS — Z113 Encounter for screening for infections with a predominantly sexual mode of transmission: Secondary | ICD-10-CM

## 2019-02-22 DIAGNOSIS — Z202 Contact with and (suspected) exposure to infections with a predominantly sexual mode of transmission: Secondary | ICD-10-CM

## 2019-02-22 DIAGNOSIS — F329 Major depressive disorder, single episode, unspecified: Secondary | ICD-10-CM

## 2019-02-22 DIAGNOSIS — F419 Anxiety disorder, unspecified: Secondary | ICD-10-CM

## 2019-02-22 DIAGNOSIS — F32A Depression, unspecified: Secondary | ICD-10-CM

## 2019-02-22 MED ORDER — PENICILLIN G BENZATHINE 2400000 UNIT/4ML IM SUSP
2.4000 10*6.[IU] | Freq: Once | INTRAMUSCULAR | Status: AC
  Administered 2019-02-22: 10:00:00 2400000 [IU] via INTRAMUSCULAR

## 2019-02-22 NOTE — Progress Notes (Signed)
In for STD screening due to partner informed him of +Syphillis Debera Lat, RN

## 2019-02-22 NOTE — Progress Notes (Signed)
[facility] Department STI clinic/screening visit  Subjective:  Tony Franco is a 69 y.o. male being seen today for  Chief Complaint  Patient presents with  . SEXUALLY TRANSMITTED DISEASE     The patient reports they do not have symptoms.   Patient has the following medical conditions:   Patient Active Problem List   Diagnosis Date Noted  . BPH with obstruction/lower urinary tract symptoms 06/22/2017  . Mixed hyperlipidemia 06/20/2017  . Mild episode of recurrent major depressive disorder ([place]) 08/04/2016  . Right lateral epicondylitis 08/04/2016  . Screening for prostate cancer 08/04/2016  . Osteoarthritis of knees, bilateral 02/11/2016  . ED (erectile dysfunction) 04/20/2015  . Insomnia 04/20/2015  . Paresthesia 03/16/2015  . RLS (restless legs syndrome) 03/16/2015  . Colon cancer screening 03/16/2015    HPI  Pt reports he was informed he is a contact to syphilis. Denies symptoms aside from a sore throat.   See flowsheet for further details and programmatic requirements.    No components found for: HCV  The following portions of the patient's history were reviewed and updated as appropriate: allergies, current medications, past medical history, past social history, past surgical history and problem list.  Objective:  There were no vitals filed for this visit.   Physical Exam Constitutional:      Appearance: Normal appearance.  HENT:     Head: Normocephalic and atraumatic.     Comments: No nits or hair loss    Mouth/Throat:     Mouth: Mucous membranes are moist.     Pharynx: Oropharynx is clear. No oropharyngeal exudate or posterior oropharyngeal erythema.  Pulmonary:     Effort: Pulmonary effort is normal.  Abdominal:     General: Abdomen is flat.     Palpations: Abdomen is soft. There is no hepatomegaly or mass.     Tenderness: There is no abdominal tenderness.  Genitourinary:    Pubic Area: No rash or pubic lice.      Penis: Normal and  uncircumcised.      Testes: Normal.     Epididymis:     Right: Normal.     Left: Normal.     Rectum: Normal.  Lymphadenopathy:     Head:     Right side of head: No preauricular or posterior auricular adenopathy.     Left side of head: No preauricular or posterior auricular adenopathy.     Cervical: No cervical adenopathy.     Upper Body:     Right upper body: No supraclavicular or axillary adenopathy.     Left upper body: No supraclavicular or axillary adenopathy.     Lower Body: No right inguinal adenopathy. No left inguinal adenopathy.  Skin:    General: Skin is warm and dry.     Findings: No rash.  Neurological:     Mental Status: He is alert and oriented to person, place, and time.        Assessment and Plan:  Tony Franco is a 69 y.o. male presenting to the [facility] Department for STI screening   1. Screening examination for venereal disease -Screenings today as below.  -Patient does meet criteria for HepB, HepC Screening. Accepts these screenings. -Urine NAAT gonorrhea and chlamydia obtained. -Discussed PrEP, [facility] information given.  -Counseled on warning s/sx and when to seek care. Recommended condom use with all sex and discussed importance of condom use for STI prevention. - Chlamydia/Gonorrhea [place] [facility] - HBV Antigen/Antibody State  Lab - HIV/HCV [place] [facility] - Syphilis Serology, [place] [facility] - [place] [facility]  2. Syphilis contact -No symptoms. Will treat as contact to syphilis w/Penicillin G 2.4 million IM x 1. -If positive RPR will need titers at 6 and 12 months.  -Pt has already been contact by DIS. -Discussed possible Jarisch-Herxheimer reaction of HA, myalgias, rigors, diaphoresis, hypotension, and worsening of rash if initially present. Uncommon manifestations include meningitis, respiratory distress, renal and/or hepatic dysfunction, mental status changes,  stroke, seizures, and uterine contractions in pregnancy.  -No sex x2 wks. Encouraged condoms with all sex. - Penicillin G Benzathine SUSP 2,400,000 Units  3. Anxiety and depression Upon flowsheet questioning pt endorses anxiety and depression and would like to speak with a counselor. Will refer to behavioral health.  - Ambulatory referral to [facility]     Return for screening as needed.  No future appointments.  Kandee Keen, PA-C

## 2019-02-22 NOTE — Progress Notes (Signed)
Patient given condoms, [facility] provider card, and Bicillin per provider orders. Patient tolerated Bicillin well, and stayed for observation for 20 minutes following administration. Patient denies issues related to medication.Jenetta Downer, RN

## 2019-03-01 ENCOUNTER — Telehealth: Payer: Self-pay

## 2019-03-01 DIAGNOSIS — A749 Chlamydial infection, unspecified: Secondary | ICD-10-CM

## 2019-03-01 LAB — HM HEPATITIS C SCREENING LAB: HM Hepatitis Screen: NEGATIVE

## 2019-03-01 LAB — HM HIV SCREENING LAB: HM HIV Screening: NEGATIVE

## 2019-03-01 LAB — HEPATITIS B SURFACE ANTIGEN

## 2019-03-05 NOTE — Telephone Encounter (Signed)
Lab sent for scanning.  +Chlamydia on rectal cx. Needs tx. Aileen Fass, RN

## 2019-03-12 NOTE — Telephone Encounter (Signed)
TC to patient. Verified ID via password/SS#. Informed of positive chlamydia and need for tx. Instructed to eat before visit and have partner call for tx appt. Appt scheduled for 03/14/19 Aileen Fass, RN

## 2019-03-14 ENCOUNTER — Ambulatory Visit: Payer: Self-pay

## 2019-03-14 ENCOUNTER — Other Ambulatory Visit: Payer: Self-pay

## 2019-03-14 DIAGNOSIS — A749 Chlamydial infection, unspecified: Secondary | ICD-10-CM

## 2019-03-14 MED ORDER — AZITHROMYCIN 500 MG PO TABS
1000.0000 mg | ORAL_TABLET | Freq: Once | ORAL | Status: AC
  Administered 2019-03-14: 1000 mg via ORAL

## 2019-03-28 ENCOUNTER — Ambulatory Visit: Payer: Medicare Other | Admitting: Licensed Clinical Social Worker

## 2019-05-30 ENCOUNTER — Ambulatory Visit: Payer: Self-pay | Admitting: Physician Assistant

## 2019-05-30 ENCOUNTER — Other Ambulatory Visit: Payer: Self-pay

## 2019-05-30 ENCOUNTER — Encounter: Payer: Self-pay | Admitting: Physician Assistant

## 2019-05-30 DIAGNOSIS — F329 Major depressive disorder, single episode, unspecified: Secondary | ICD-10-CM

## 2019-05-30 DIAGNOSIS — Z113 Encounter for screening for infections with a predominantly sexual mode of transmission: Secondary | ICD-10-CM

## 2019-05-30 DIAGNOSIS — R4589 Other symptoms and signs involving emotional state: Secondary | ICD-10-CM | POA: Insufficient documentation

## 2019-05-30 NOTE — Progress Notes (Signed)
   [facility] Department STI clinic/screening visit  Subjective:  Tony Franco is a 69 y.o. male being seen today for an STI screening visit. The patient reports they do not have symptoms.    Patient has the following medical conditions:   Patient Active Problem List   Diagnosis Date Noted  . BPH with obstruction/lower urinary tract symptoms 06/22/2017  . Mixed hyperlipidemia 06/20/2017  . Mild episode of recurrent major depressive disorder ([place]) 08/04/2016  . Right lateral epicondylitis 08/04/2016  . Screening for prostate cancer 08/04/2016  . Osteoarthritis of knees, bilateral 02/11/2016  . ED (erectile dysfunction) 04/20/2015  . Insomnia 04/20/2015  . Paresthesia 03/16/2015  . RLS (restless legs syndrome) 03/16/2015  . Colon cancer screening 03/16/2015     Chief Complaint  Patient presents with  . SEXUALLY TRANSMITTED DISEASE    69 yo male here for STI screening.   Patient reports no genital exposure.   See flowsheet for further details and programmatic requirements.    The following portions of the patient's history were reviewed and updated as appropriate: allergies, current medications, past medical history, past social history, past surgical history and problem list.  Objective:  There were no vitals filed for this visit.  Physical Exam Nursing note reviewed.  Constitutional:      Appearance: Normal appearance. He is normal weight.  HENT:     Mouth/Throat:     Mouth: Mucous membranes are moist.     Pharynx: Oropharynx is clear. No oropharyngeal exudate or posterior oropharyngeal erythema.  Pulmonary:     Effort: Pulmonary effort is normal.  Abdominal:     General: Abdomen is flat.     Palpations: Abdomen is soft.  Genitourinary:    Rectum: Normal. No anal fissure.     Comments: No rectal discharge or lesion noted. Musculoskeletal:     Cervical back: Normal range of motion and neck supple.  Lymphadenopathy:     Cervical: No cervical  adenopathy.  Skin:    General: Skin is warm and dry.  Neurological:     Mental Status: He is alert and oriented to person, place, and time.  Psychiatric:        Behavior: Behavior normal.        Thought Content: Thought content normal.        Judgment: Judgment normal.     Comments: Becomes tearful and complains of sudden sadness during visit. Quickly composes himself.       Assessment and Plan:  Tony Franco is a 69 y.o. male presenting to the [facility] Department for STI screening  1. Routine screening for STI (sexually transmitted infection) Await test results. - Gonococcus culture - Gonococcus culture - HIV/HCV [place] [facility] - HBV Antigen/Antibody State Lab     No follow-ups on file.  No future appointments.  Lora Havens, PA-C

## 2019-05-30 NOTE — Progress Notes (Signed)
In for screening due to partner not using Prep and recent HIV "undectable"; desires rescreening for Chlamydia Debera Lat, RN

## 2019-06-03 LAB — GONOCOCCUS CULTURE

## 2019-06-04 LAB — HEPATITIS B SURFACE ANTIGEN

## 2019-06-07 LAB — HM HEPATITIS C SCREENING LAB: HM Hepatitis Screen: NEGATIVE

## 2019-06-07 LAB — HM HIV SCREENING LAB: HM HIV Screening: NEGATIVE

## 2019-06-13 ENCOUNTER — Encounter: Payer: Self-pay | Admitting: Family Medicine

## 2019-06-13 ENCOUNTER — Ambulatory Visit (INDEPENDENT_AMBULATORY_CARE_PROVIDER_SITE_OTHER): Payer: Medicare Other | Admitting: Family Medicine

## 2019-06-13 ENCOUNTER — Other Ambulatory Visit: Payer: Self-pay

## 2019-06-13 ENCOUNTER — Ambulatory Visit
Admission: RE | Admit: 2019-06-13 | Discharge: 2019-06-13 | Disposition: A | Discharge status: Home / Self Care | Payer: Medicare Other | Source: Ambulatory Visit | Attending: Family Medicine | Admitting: Family Medicine

## 2019-06-13 VITALS — BP 103/69 | HR 72 | Temp 97.5°F | Resp 16 | Ht 72.0 in | Wt 171.8 lb

## 2019-06-13 DIAGNOSIS — J9801 Acute bronchospasm: Secondary | ICD-10-CM | POA: Diagnosis present

## 2019-06-13 DIAGNOSIS — J0110 Acute frontal sinusitis, unspecified: Secondary | ICD-10-CM

## 2019-06-13 MED ORDER — ALBUTEROL SULFATE HFA 108 (90 BASE) MCG/ACT IN AERS: 2.0000 | INHALATION_SPRAY | RESPIRATORY_TRACT | 1 refills | Status: DC | PRN

## 2019-06-13 MED ORDER — LEVOFLOXACIN 500 MG PO TABS: 500.0000 mg | ORAL_TABLET | Freq: Every day | ORAL | 0 refills | Status: DC

## 2019-06-13 NOTE — Patient Instructions (Addendum)
Thank you for coming to the office today.  Descovy (primary recommended PREP) - previous med that was used, or original PREP was called - Truvada  1. It sounds like you had an Upper Respiratory Virus that has settled into a Bronchitis, lower respiratory tract infection. I don't have concerns for pneumonia today, and think that this should gradually improve. Once you are feeling better, the cough may take a few weeks to fully resolve. I do hear wheezing and coarse breath sounds, this may be due to the virus, also could be related to smoking.  - Use Albuterol inhaler 2 puffs every 4-6 hours around the clock for next 2-3 days, max up to 5 days then use as needed (ask pharmacist to demonstrate proper inhaler use)  After Chest X-ray we will call you with result. Send to [facility].  If need we can add prednisone / antibiotic  - Start Prednisone 50mg  daily for next 5 days - this will open up lungs allow you to breath better and treat that wheezing or bronchospasm  - Use nasal saline (Simply Saline or Ocean Spray) to flush nasal congestion multiple times a day, may help cough - Drink plenty of fluids to improve congestion  If your symptoms seem to worsen instead of improve over next several days, including significant fever / chills, worsening shortness of breath, worsening wheezing, or nausea / vomiting and can't take medicines - return sooner or go to hospital Emergency Department for more immediate treatment.  Please schedule a Follow-up Appointment to: Return in about 1 week (around 06/20/2019), or if symptoms worsen or fail to improve, for cough asthma.  If you have any other questions or concerns, please feel free to call the office or send a message through [place]. You may also schedule an earlier appointment if necessary.  Additionally, you may be receiving a survey about your experience at our office within a few days to 1 week by e-mail or mail. We value your feedback.  [name], DO [place]

## 2019-06-13 NOTE — Progress Notes (Signed)
Subjective:    Patient ID: Tony Franco, male    DOB: 1950/12/04, 69 y.o.   MRN: [id number]  [name] is a 69 y.o. male presenting on 06/13/2019 for Sinusitis (chest congestion concerned about getting pneumonia---SOB, Cough, mild fever but no Chills, HA onset 14 days getting worst)   HPI   SINUSITIS / COUGH Recent course onset 2 weeks ago, allergies, sinus, pressure and congestion and headache Now last few days seems to have worsened and move into chest, worse when laying down, not as bad when standing and walking. Cough is not as productive but has some post nasal drip. Taking mucinex OTC Admits some sneezing. Admits mild subjective fever at times temperature off  Health Maintenance: Last COVID19 2nd dose 4/29, 1 week ago, he felt more fatigue but didn't really affect his other symptoms.  Depression screen [facility] 2/9 02/21/2019 11/22/2018 08/24/2018  Decreased Interest 2 0 0  Down, Depressed, Hopeless 2 0 0  PHQ - 2 Score 4 0 0  Altered sleeping 3 - 3  Tired, decreased energy 3 - 0  Change in appetite 0 - 0  Feeling bad or failure about yourself  1 - 0  Trouble concentrating 1 - 0  Moving slowly or fidgety/restless 0 - 0  Suicidal thoughts 0 - 0  PHQ-9 Score 12 - 3  Difficult doing work/chores Not difficult at all - Somewhat difficult  Some recent data might be hidden    Social History   Tobacco Use  . Smoking status: Never Smoker  . Smokeless tobacco: Never Used  Substance Use Topics  . Alcohol use: Yes    Alcohol/week: 3.0 standard drinks    Types: 3 Glasses of wine per week  . Drug use: Not Currently    Types: Marijuana    Comment: in past    Review of Systems Per HPI unless specifically indicated above     Objective:    BP 103/69   Pulse 72   Temp (!) 97.5 F (36.4 C) (Temporal)   Resp 16   Ht 6' (1.829 m)   Wt 171 lb 12.8 oz (77.9 kg)   SpO2 97%   BMI 23.30 kg/m   Wt Readings from Last 3 Encounters:  06/13/19 171 lb 12.8 oz (77.9 kg)    11/18/18 185 lb (83.9 kg)  02/19/18 180 lb (81.6 kg)    Physical Exam Vitals and nursing note reviewed.  Constitutional:      General: He is not in acute distress.    Appearance: He is well-developed. He is not diaphoretic.     Comments: Well-appearing, comfortable, cooperative  HENT:     Head: Normocephalic and atraumatic.  Eyes:     General:        Right eye: No discharge.        Left eye: No discharge.     Conjunctiva/sclera: Conjunctivae normal.  Neck:     Thyroid: No thyromegaly.  Cardiovascular:     Rate and Rhythm: Normal rate and regular rhythm.     Heart sounds: Normal heart sounds. No murmur.  Pulmonary:     Effort: Pulmonary effort is normal. No respiratory distress.     Breath sounds: No wheezing or rales.     Comments: Slight reduced air movement. Coughing spells frequently, speaks full sentences. Musculoskeletal:        General: Normal range of motion.     Cervical back: Normal range of motion and neck supple.  Lymphadenopathy:  Cervical: No cervical adenopathy.  Skin:    General: Skin is warm and dry.     Findings: No erythema or rash.  Neurological:     Mental Status: He is alert and oriented to person, place, and time.  Psychiatric:        Behavior: Behavior normal.     Comments: Well groomed, good eye contact, normal speech and thoughts      I have personally reviewed the radiology report from STAT CXR 06/13/19  CLINICAL DATA:  Cough  EXAM: CHEST - 2 VIEW  COMPARISON:  November 18, 2018  FINDINGS: Lungs are clear. Heart size and pulmonary vascularity are normal. No adenopathy. No bone lesions.  IMPRESSION: Lungs clear.  Cardiac silhouette within normal limits.   Electronically Signed   By: Lowella Grip III M.D.   On: 06/13/2019 09:40   Results for orders placed or performed in visit on 06/07/19  [facility] HIV SCREENING LAB  Result Value Ref Range   [facility] HIV Screening Negative - Validated   [facility] HEPATITIS C SCREENING LAB  Result  Value Ref Range   [facility] Hepatitis Screen Negative-Validated       Assessment & Plan:   Problem List Items Addressed This Visit    None    Visit Diagnoses    Acute non-recurrent frontal sinusitis    -  Primary   Relevant Medications   levofloxacin (LEVAQUIN) 500 MG tablet   Cough due to bronchospasm       Relevant Medications   albuterol (VENTOLIN HFA) 108 (90 Base) MCG/ACT inhaler   Other Relevant Orders   DG Chest 2 View (Completed)      Acute on subacute sinusitis now with lower respiratory infection symptoms S/p COVID19 vaccine series completed , 2nd dose >1 week ago Afebrile, no productive cough Prior history of asthma intermittent allergy induced most likely, previously pulmonology 2020, and had PFTs, was given therapy for asthma in past, not continuing to use, has albuterol not using. In past prednisone helpful.  Clinically with sinus now progressing to lower respiratory Check STAT CXR today, no focal lung abnormality on exam Reviewed result, called patient, negative for focal PNA  Re order Albuterol inhaler PRN, start using regularly PRN cough now given history, likely asthma component Defer prednisone for now, given no active wheezing, will treat sinuses for now instead Start levaquin 500mg  daily x 7 days, discussed risk benefit, tendon issue on FQ May take OTC cough medicine PRN Offered advair or maintenance inhaler defer for now  Follow-up if not improving, consider add Prednisone burst.  Meds ordered this encounter  Medications  . albuterol (VENTOLIN HFA) 108 (90 Base) MCG/ACT inhaler    Sig: Inhale 2 puffs into the lungs every 4 (four) hours as needed for wheezing or shortness of breath (cough).    Dispense:  6.7 g    Refill:  1  . levofloxacin (LEVAQUIN) 500 MG tablet    Sig: Take 1 tablet (500 mg total) by mouth daily. For 7 days    Dispense:  7 tablet    Refill:  0      Follow up plan: Return in about 1 week (around 06/20/2019), or if symptoms worsen or  fail to improve, for cough asthma.   [name], DO [facility] 06/13/2019, 9:00 AM

## 2019-06-13 NOTE — Addendum Note (Signed)
Addended by: Lora Havens on: 06/13/2019 05:08 PM   Modules accepted: Orders

## 2019-06-20 ENCOUNTER — Encounter: Payer: Self-pay | Admitting: Licensed Clinical Social Worker

## 2019-06-20 ENCOUNTER — Ambulatory Visit: Payer: Medicare Other | Admitting: Licensed Clinical Social Worker

## 2019-06-20 DIAGNOSIS — F330 Major depressive disorder, recurrent, mild: Secondary | ICD-10-CM
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
## 2019-06-25 ENCOUNTER — Ambulatory Visit: Payer: Medicare Other | Admitting: Licensed Clinical Social Worker

## 2019-06-25 DIAGNOSIS — F330 Major depressive disorder, recurrent, mild: Secondary | ICD-10-CM

## 2019-06-25 NOTE — Progress Notes (Signed)
Counselor/Therapist Progress Note  Patient ID: Tony Franco, MRN: [id number],    Date: 06/25/2019  Time Spent: 45 minutes  Treatment Type: Individual Therapy  Reported Symptoms: Sleep disturbance and low mood, denies SI, ruminating thoughts, anxious thoughts/worries secondary to relationship challenges  Mental Status Exam:  Appearance:   Casual     Behavior:  Appropriate and Sharing  Motor:  Normal  Speech/Language:   Normal Rate  Affect:  Appropriate  Mood:  normal  Thought process:  normal  Thought content:    WNL  Sensory/Perceptual disturbances:    WNL  Orientation:  oriented to person, place, time/date, situation and day of week  Attention:  Good  Concentration:  Good  Memory:  WNL  Fund of knowledge:   Good  Insight:    Good  Judgment:   Good  Impulse Control:  Good   Risk Assessment: Danger to Self:  No Self-injurious Behavior: No Danger to Others: No Duty to Warn:no Physical Aggression / Violence:No  Access to Firearms a concern: No  Gang Involvement:No   Subjective: Patient was engaged and cooperative throughout the session using time effectively to discuss thoughts, feelings, goal of treatment and treatment plan.  Patient voices continued motivation for treatment and understanding of depression issues. Patient is likely to benefit from future treatment because he is motivated to decrease depression and reports increased hope from sessions.    Interventions: Cognitive Behavioral Therapy Established psychological safety.  Checked in with patient and reviewed previous session, including assessment and goal of treatment. Reviewed CBTs. Explored patient's goal of treatment and worked collaboratively to develop CBT treatment plan. Began discussing values and value driven behavior. Provided support through active listening, validation of feelings, and highlighted patient's strengths.  Diagnosis:   ICD-10-CM   1. Mild episode of recurrent major depressive disorder  ([place])  F33.0     Plan: focus on values "personal and social life"  Patient's goal of treatment: Tired of not being happy and don't know how to fix it.   Treatment Target: Understand the relationship between thoughts, emotions, and behaviors  - Psychoeducation on CBT model   - Teach the connection between thoughts, emotions, and behaviors  Treatment Target: Increase realistic balanced thinking  - Explore patient's thoughts, beliefs, automatic thoughts, assumptions  - Identify hot thoughts (upsetting ideas, self-talk and mental images) - Identify and replace thinking that leads to distress  - Process distress and allow for emotional release  - Cognitive reframing  - Challenging the evidence  - Cognitive reappraisal  - Restructuring, Socratic questioning  Treatment Target: Reducing vulnerability to "emotional mind" - Values clarification   - Self-care - nutrition, sleep, exercise  - Increase positive and meaningful events  o Activity planning  Increase emotional regulation / coping skills  - Coping skills to manage anxious and ruminating thoughts  - Teach distress tolerance techniques - "what helps me"  - Mindfulness practices  - Deep breathing  - STOP technique  Future Appointments  Date Time Provider [place]  07/04/2019  4:30 PM Milton Ferguson, LCSW [facility] None    Interpreter used: NA   Patient at [place], [place]

## 2019-07-04 ENCOUNTER — Ambulatory Visit: Payer: Medicare Other | Admitting: Licensed Clinical Social Worker

## 2019-08-03 ENCOUNTER — Other Ambulatory Visit: Payer: Self-pay | Admitting: Family Medicine

## 2019-08-03 DIAGNOSIS — F5101 Primary insomnia: Secondary | ICD-10-CM

## 2019-08-03 DIAGNOSIS — F330 Major depressive disorder, recurrent, mild: Secondary | ICD-10-CM

## 2019-08-03 NOTE — Telephone Encounter (Signed)
Requested Prescriptions  Pending Prescriptions Disp Refills   traZODone (DESYREL) 50 MG tablet [Pharmacy Med Name: traZODone HCl 50 MG Oral Tablet] 90 tablet 1    Sig: TAKE 1 TABLET BY MOUTH AT BEDTIME     Psychiatry: Antidepressants - Serotonin Modulator Passed - 08/03/2019  3:23 PM      Passed - Completed PHQ-2 or PHQ-9 in the last 360 days.      Passed - Valid encounter within last 6 months    Recent Outpatient Visits          1 month ago Acute non-recurrent frontal sinusitis   [place], DO   5 months ago Mild intermittent asthma with exacerbation   [place], DO   8 months ago Mild intermittent asthma with exacerbation   [place], DO   11 months ago High risk homosexual behavior   [place], DO   1 year ago Shortness of breath   [place], Devonne [place], [place]

## 2019-08-13 ENCOUNTER — Ambulatory Visit: Payer: Self-pay | Admitting: Family Medicine

## 2019-08-13 NOTE — Telephone Encounter (Signed)
Pt reports occasional loose stools "For months." States past Thursday worsening. States loose stools occur after eating, with urgency. Reports at times loose, other times watery. Denies nausea, no vomiting, afebrile. Denies dizziness, states is staying hydrated. No noted blood in stool. Also reports "Soreness like a tightness or achy feeling lower abdomen, intermittent. Spoke with Apolonio Schneiders at practice due to covid screening, blocked from [place]. Pt scheduled for Thursday at [place]. Pt is presently out of town. Care advise given; advised ED if symptoms worsen; increased pain, fever, blood in stool, reviewed S/S dehydration. Pt verbalizes understanding.   Reason for Disposition  [1] MILD diarrhea (e.g., 1-3 or more stools than normal in past 24 hours) without known cause AND [2] present >  7 days  Answer Assessment - Initial Assessment Questions 1. DIARRHEA SEVERITY: "How bad is the diarrhea?" "How many extra stools have you had in the past 24 hours than normal?"    - NO DIARRHEA (SCALE 0)   - MILD (SCALE 1-3): Few loose or mushy BMs; increase of 1-3 stools over normal daily number of stools; mild increase in ostomy output.   -  MODERATE (SCALE 4-7): Increase of 4-6 stools daily over normal; moderate increase in ostomy output. * SEVERE (SCALE 8-10; OR 'WORST POSSIBLE'): Increase of 7 or more stools daily over normal; moderate increase in ostomy output; incontinence.    Moderate, after eating. 2. ONSET: "When did the diarrhea begin?"      Thursday, occasional loose stools for months. 3. BM CONSISTENCY: "How loose or watery is the diarrhea?"      Loose, watery at times  after eating. 4. VOMITING: "Are you also vomiting?" If Yes, ask: "How many times in the past 24 hours?"      no 5. ABDOMINAL PAIN: "Are you having any abdominal pain?" If Yes, ask: "What does it feel like?" (e.g., crampy, dull, intermittent, constant)      "Soreness below belly button like a ache" 6. ABDOMINAL PAIN SEVERITY: If present, ask:  "How bad is the pain?"  (e.g., Scale 1-10; mild, moderate, or severe)   - MILD (1-3): doesn't interfere with normal activities, abdomen soft and not tender to touch    - MODERATE (4-7): interferes with normal activities or awakens from sleep, tender to touch    - SEVERE (8-10): excruciating pain, doubled over, unable to do any normal activities       Achy 7. ORAL INTAKE: If vomiting, "Have you been able to drink liquids?" "How much fluids have you had in the past 24 hours?"     Yes, "Staying hydrated" 8. HYDRATION: "Any signs of dehydration?" (e.g., dry mouth [not just dry lips], too weak to stand, dizziness, new weight loss) "When did you last urinate?"     No 9. EXPOSURE: "Have you traveled to a foreign country recently?" "Have you been exposed to anyone with diarrhea?" "Could you have eaten any food that was spoiled?"     no 10. ANTIBIOTIC USE: "Are you taking antibiotics now or have you taken antibiotics in the past 2 months?"       no 11. OTHER SYMPTOMS: "Do you have any other symptoms?" (e.g., fever, blood in stool)       "Sore" lower abdomen, not really a pain"  Protocols used: DIARRHEA-A-AH

## 2019-08-15 ENCOUNTER — Encounter: Payer: Self-pay | Admitting: Family Medicine

## 2019-08-15 ENCOUNTER — Ambulatory Visit (INDEPENDENT_AMBULATORY_CARE_PROVIDER_SITE_OTHER): Payer: Medicare Other | Admitting: Family Medicine

## 2019-08-15 ENCOUNTER — Ambulatory Visit
Admission: RE | Admit: 2019-08-15 | Discharge: 2019-08-15 | Disposition: A | Discharge status: Home / Self Care | Payer: Medicare Other | Source: Ambulatory Visit | Attending: Family Medicine | Admitting: Family Medicine

## 2019-08-15 ENCOUNTER — Other Ambulatory Visit: Payer: Self-pay

## 2019-08-15 ENCOUNTER — Ambulatory Visit
Admission: RE | Admit: 2019-08-15 | Discharge: 2019-08-15 | Disposition: A | Discharge status: Home / Self Care | Payer: Medicare Other | Attending: Family Medicine | Admitting: Family Medicine

## 2019-08-15 VITALS — BP 106/68 | HR 87 | Temp 97.7°F | Resp 16 | Ht 72.0 in | Wt 175.0 lb

## 2019-08-15 DIAGNOSIS — F330 Major depressive disorder, recurrent, mild: Secondary | ICD-10-CM

## 2019-08-15 DIAGNOSIS — R197 Diarrhea, unspecified: Secondary | ICD-10-CM | POA: Diagnosis not present

## 2019-08-15 DIAGNOSIS — R109 Unspecified abdominal pain: Secondary | ICD-10-CM | POA: Diagnosis present

## 2019-08-15 DIAGNOSIS — G8929 Other chronic pain: Secondary | ICD-10-CM

## 2019-08-15 DIAGNOSIS — M25562 Pain in left knee: Secondary | ICD-10-CM

## 2019-08-15 DIAGNOSIS — M170 Bilateral primary osteoarthritis of knee: Secondary | ICD-10-CM

## 2019-08-15 DIAGNOSIS — F5101 Primary insomnia: Secondary | ICD-10-CM

## 2019-08-15 MED ORDER — DICYCLOMINE HCL 10 MG PO CAPS: 10.0000 mg | ORAL_CAPSULE | Freq: Three times a day (TID) | ORAL | 0 refills | Status: DC

## 2019-08-15 MED ORDER — ZOLPIDEM TARTRATE ER 6.25 MG PO TBCR: 6.2500 mg | EXTENDED_RELEASE_TABLET | Freq: Every evening | ORAL | 0 refills | Status: DC | PRN

## 2019-08-15 NOTE — Progress Notes (Signed)
Subjective:    Patient ID: Tony Franco, male    DOB: [birth year], 69 y.o.   MRN: [id number]  Tony Franco is a 69 y.o. male presenting on 08/15/2019 for Diarrhea (getting worst --exhaustion--btw left knee pain) and Abdominal Pain (lower side onset month and half)   HPI   Diarrhea, chronic New problem significant diarrhea - onset 1 week - He ate at [employer] 1 week ago today, he developed some constant up to 1-2 times an hour, several times a day diarrhea, seems to come and go, may temporarily return. He admitted a few accidents overnight with soiling. He did not eat anything for 40 hours after that meal [employer] last week. It did not help. Longest span without diarrhea was 6 hours. He has had up to 10+ loose stool vs occasionally liquid stool. - Also he has a predictable bowel habit pattern with loose stool or BM more urgently in AM after coffee for years, very common pattern. He uses creamer. No other artificial sweeteners, no gum or mints. He did take Levaquin 2 months ago in May 2021 - no history of C Diff. - Denies dark stool or blood, mucus, unusual odor - Admits some nausea, denies vomiting  Additionally He has established with personal trainer, noticed abdominal wall muscle issues with exercise regimen. He describes still having tension and aching in abdominal wall muscles. - Not exercised in 1 week, still causing symptoms, constant lower abdominal discomfort - If he is doing a crunch exercise it bothers it, but other activity does not bother it including turning twisting or pressing on abdomen.  Other problems today  Left Knee Pain / Osteoarthritis History of bone spurs of left knee cap, had arthroscopic surgery years ago with good resolution of problem. Now worsening in past 1 week having flare, seems to cause pain if he has leg crossed or leaning or pressure on the left knee, otherwise not bothering him to walk or bend or move the knee / leg joint. No swelling in knee.  Not locking with walking or movement. Not using brace or medicine.  Insomnia Chronic Depression, recurrent mild See prior note for background details, known significant family and other life stressors. Today he reports concern with his current regimen on Trazodone 50mg  nightly is ineffective for maintaining sleep. Often wakes up. He has been on Hydroxyzine PRN in past. Duloxetine as well. He remains off those meds had been doing fairly well. Tried melatonin He asks for assistance with sleep insomnia waking up. Has not tried other sleep meds before See PHQ GAD below   Depression screen [facility] 2/9 02/21/2019 11/22/2018 08/24/2018  Decreased Interest 2 0 0  Down, Depressed, Hopeless 2 0 0  PHQ - 2 Score 4 0 0  Altered sleeping 3 - 3  Tired, decreased energy 3 - 0  Change in appetite 0 - 0  Feeling bad or failure about yourself  1 - 0  Trouble concentrating 1 - 0  Moving slowly or fidgety/restless 0 - 0  Suicidal thoughts 0 - 0  PHQ-9 Score 12 - 3  Difficult doing work/chores Not difficult at all - Somewhat difficult  Some encounter information is confidential and restricted. Go to Review Flowsheets activity to see all data.  Some recent data might be hidden    Social History   Tobacco Use  . Smoking status: Never Smoker  . Smokeless tobacco: Never Used  Vaping Use  . Vaping Use: Never used  Substance Use Topics  .  Alcohol use: Yes    Alcohol/week: 3.0 standard drinks    Types: 3 Glasses of wine per week  . Drug use: Yes    Types: Marijuana, Amphetamines    Comment: in past--only marijuana still use occ  Amphetamines    Review of Systems Per HPI unless specifically indicated above     Objective:    BP 106/68   Pulse 87   Temp 97.7 F (36.5 C) (Temporal)   Resp 16   Ht 6' (1.829 m)   Wt 175 lb (79.4 kg)   SpO2 97%   BMI 23.73 kg/m   Wt Readings from Last 3 Encounters:  08/15/19 175 lb (79.4 kg)  06/13/19 171 lb 12.8 oz (77.9 kg)  11/18/18 185 lb (83.9 kg)      Physical Exam Vitals and nursing note reviewed.  Constitutional:      General: He is not in acute distress.    Appearance: He is well-developed. He is not diaphoretic.     Comments: Well-appearing, comfortable, cooperative  HENT:     Head: Normocephalic and atraumatic.  Eyes:     General:        Right eye: No discharge.        Left eye: No discharge.     Conjunctiva/sclera: Conjunctivae normal.  Neck:     Thyroid: No thyromegaly.  Cardiovascular:     Rate and Rhythm: Normal rate and regular rhythm.     Heart sounds: Normal heart sounds. No murmur heard.   Pulmonary:     Effort: Pulmonary effort is normal. No respiratory distress.     Breath sounds: Normal breath sounds. No wheezing or rales.  Abdominal:     General: Bowel sounds are increased. There is no distension.     Palpations: Abdomen is soft. There is no hepatomegaly or mass.     Tenderness: There is no abdominal tenderness (no reproducible abdominal pain today RUQ LUQ LLQ. Area he describes discomfort more internally was lower midline abdomen around umbilicus or lower in general area.). There is no guarding or rebound. Negative signs include Murphy's sign and McBurney's sign.     Hernia: No hernia is present.  Musculoskeletal:        General: Normal range of motion.     Cervical back: Normal range of motion and neck supple.     Comments: Left Knee Inspection: Normal appearance and symmetrical. No ecchymosis or effusion. Palpation: Non-tender. Mild crepitus ROM: Full active ROM bilaterally Special Testing: Lachman / Valgus/Varus tests negative with intact ligaments (ACL, MCL, LCL) Strength: 5/5 intact knee flex/ext, ankle dorsi/plantarflex Neurovascular: distally intact sensation light touch and pulses   Lymphadenopathy:     Cervical: No cervical adenopathy.  Skin:    General: Skin is warm and dry.     Findings: No erythema or rash.  Neurological:     Mental Status: He is alert and oriented to person, place, and  time.  Psychiatric:        Behavior: Behavior normal.     Comments: Well groomed, good eye contact, normal speech and thoughts      I have personally reviewed the radiology report from [facility] on 08/15/19.  CLINICAL DATA:  Abdominal pain and diarrhea  EXAM: ABDOMEN - 1 VIEW  COMPARISON:  None.  FINDINGS: There is moderate stool in the colon. There is no dilatation of the rectum due to stool. There is no bowel dilatation or air-fluid level to suggest bowel obstruction. No free air. There are probable  phleboliths in the pelvis.  IMPRESSION: Moderate stool in the colon. No findings suggesting fecal impaction. No bowel obstruction or free air.   Electronically Signed   By: Lowella Grip III M.D.   On: 08/15/2019 09:58  Results for orders placed or performed in visit on 06/07/19  HM HIV SCREENING LAB  Result Value Ref Range   HM HIV Screening Negative - Validated   HM HEPATITIS C SCREENING LAB  Result Value Ref Range   HM Hepatitis Screen Negative-Validated       Assessment & Plan:   Problem List Items Addressed This Visit    Osteoarthritis of knees, bilateral   Mild episode of recurrent major depressive disorder (HCC)   Insomnia   Relevant Medications   zolpidem (AMBIEN CR) 6.25 MG CR tablet    Other Visit Diagnoses    Diarrhea, unspecified type    -  Primary   Relevant Medications   dicyclomine (BENTYL) 10 MG capsule   Abdominal cramping       Relevant Orders   DG Abd 1 View (Completed)   Chronic pain of left knee          #Diarrhea, Abdominal Pain Clinically acute onset 1 week, in setting of a chronic history of some loose stools but now has persistent pattern, with possibility of encopresis and fecal urgency at times. - No other GI red flag symptoms reassuring. Possible onset [place] but without other viral gastroenteritis symptoms (no nausea vomiting fever). He has an appetite - Benign abdominal exam. No focal abdominal pain. -  Hemodynamically stable. No sign of dehydration at this time.  Plan [facility] x-ray today - Start Dicyclomine 10mg  TID WC QHS PRN cramping - pain seems less likely MSK and is more cramping abdominal related - Diet changes, avoid caffeine and trigger for diarrhea - Defer stool panel culture at this time - given lack of triggers. Less likely C Diff 2 month ago last antibiotic, but cannot rule out, can reconsider in future - Remedy peppermint oil / imodium PRN per AVS  Reviewed results after visit. Called patient X-ray result. Moderate stool burden. No other acute. Possible stool burden causing encopresis. - If unresolved in 48 hours, or not improving. He may try Miralax trial as advised to help clean out and reset bowels. - Future return if needed consider GI consult and stool studies.  #Left knee pain History of OA/DJD prior arthroscopy patellar problem Now recent flare. Possibly flared from activity. Clinically reassuring, ambulate without problem, ROM intact no erythema or swelling  Trial on Knee Sleeve, RICE therapy, OTC Voltaren gel 2-3 times PRN F/u if not improve, consider X-ray  #Insomnia / Depression Mood is stable, overall seems fairly controlled on Trazodone but insomnia is not improving. Difficulty sleep maintenance Trial on Ambien CR 6.25mg  nightly PRN - use goodrx, 30 pills, take infrequently      Meds ordered this encounter  Medications  . dicyclomine (BENTYL) 10 MG capsule    Sig: Take 1 capsule (10 mg total) by mouth 4 (four) times daily -  before meals and at bedtime. As needed abdominal cramping    Dispense:  30 capsule    Refill:  0  . zolpidem (AMBIEN CR) 6.25 MG CR tablet    Sig: Take 1 tablet (6.25 mg total) by mouth at bedtime as needed for sleep.    Dispense:  30 tablet    Refill:  0      Follow up plan: Return in about 1 week (  around 08/22/2019), or if symptoms worsen or fail to improve, for diarrhea.   [name], [facility] 08/15/2019, 8:29 AM

## 2019-08-15 NOTE — Patient Instructions (Addendum)
Thank you for coming to the office today.  Unsure exact cause of diarrhea - most likely can be overactive bowel can be triggered by possible viral gastro infection or possibly worse with dietary - such as coffee as discussed. Avoid caffeine / artificial sweeteners  X-ray today for abdomen, stay tuned for results on mychart or phone.  Take Dicyclomine as needed for abdominal cramping  Try Imodium OTC for diarrhea, follow instructions caution if using too much.  OTC Peppermint Oil (Triple Coated Capsule) 180mg  take one 3 times daily to reduce diarrhea  If still not improved by next week can add stool testing.  Lastly - if possible to have a stool blockage of constipation somewhere in bowel - we may need to trial Miralax to treat if we have a blockage. Called Encopresis  Recommend Diclofenac Topical Voltaren gel use 2-3 times a day as needed.  Use RICE therapy: - R - Rest / relative rest with activity modification avoid overuse of joint - I - Ice packs (make sure you use a towel or sock / something to protect skin) - C - Compression with flexible Knee Sleeve / ACE wrap to apply pressure and reduce swelling allowing more support - E - Elevation - if significant swelling, lift leg above heart level (toes above your nose) to help reduce swelling, most helpful at night after day of being on your feet   Please schedule a Follow-up Appointment to: Return in about 1 week (around 08/22/2019), or if symptoms worsen or fail to improve, for diarrhea.  If you have any other questions or concerns, please feel free to call the office or send a message through [place]. You may also schedule an earlier appointment if necessary.  Additionally, you may be receiving a survey about your experience at our office within a few days to 1 week by e-mail or mail. We value your feedback.  [name], DO [place]

## 2019-08-19 ENCOUNTER — Telehealth: Payer: Self-pay | Admitting: Family Medicine

## 2019-08-19 DIAGNOSIS — R197 Diarrhea, unspecified: Secondary | ICD-10-CM

## 2019-08-19 DIAGNOSIS — R109 Unspecified abdominal pain: Secondary | ICD-10-CM

## 2019-08-19 NOTE — Telephone Encounter (Signed)
Please notify patient that I have placed the following orders.  1. Lab work to be done here at our office. - Blood test CMET, CBC - Stool test - Culture and C Diff.  He can come by anytime this week for labs, these are non fasting. They should have correct stool containers and should provide instruction on how to collect and when to return.  2. Referral placed to [facility]. They will contact him for scheduling follow-up on this issue.  I know wait time for [facility] is >1 month. Therefore, will try to get him into [facility] if possible.  [name], [facility] 08/19/2019, 6:59 PM

## 2019-08-19 NOTE — Telephone Encounter (Signed)
Pt calling stating that he saw PCP on 08/15/19 due to irregular bowel movements and blocked colon. Pt states that his issue has still not been resolved and is concerned. Pt is requesting to have next steps from PCP. Please advise.

## 2019-08-20 ENCOUNTER — Other Ambulatory Visit: Payer: Self-pay

## 2019-08-20 DIAGNOSIS — R197 Diarrhea, unspecified: Secondary | ICD-10-CM

## 2019-08-20 DIAGNOSIS — R109 Unspecified abdominal pain: Secondary | ICD-10-CM

## 2019-08-20 NOTE — Telephone Encounter (Signed)
Patient advised.

## 2019-08-21 ENCOUNTER — Telehealth: Payer: Self-pay | Admitting: Family Medicine

## 2019-08-21 DIAGNOSIS — R109 Unspecified abdominal pain: Secondary | ICD-10-CM

## 2019-08-21 DIAGNOSIS — R197 Diarrhea, unspecified: Secondary | ICD-10-CM

## 2019-08-21 LAB — COMPLETE METABOLIC PANEL WITH GFR
AG Ratio: 2 (calc) (ref 1.0–2.5)
ALT: 14 U/L (ref 9–46)
AST: 12 U/L (ref 10–35)
Albumin: 4.1 g/dL (ref 3.6–5.1)
Alkaline phosphatase (APISO): 61 U/L (ref 35–144)
BUN: 17 mg/dL (ref 7–25)
CO2: 24 mmol/L (ref 20–32)
Calcium: 8.9 mg/dL (ref 8.6–10.3)
Chloride: 108 mmol/L (ref 98–110)
Creat: 0.92 mg/dL (ref 0.70–1.25)
GFR, Est African American: 100 mL/min/{1.73_m2} (ref >=60)
GFR, Est Non African American: 86 mL/min/{1.73_m2} (ref >=60)
Globulin: 2.1 g/dL (calc) (ref 1.9–3.7)
Glucose, Bld: 111 mg/dL — ABNORMAL HIGH (ref 65–99)
Potassium: 4 mmol/L (ref 3.5–5.3)
Sodium: 142 mmol/L (ref 135–146)
Total Bilirubin: 0.5 mg/dL (ref 0.2–1.2)
Total Protein: 6.2 g/dL (ref 6.1–8.1)

## 2019-08-21 LAB — CBC WITH DIFFERENTIAL/PLATELET
Absolute Monocytes: 479 cells/uL (ref 200–950)
Basophils Absolute: 41 cells/uL (ref 0–200)
Basophils Relative: 0.8 %
Eosinophils Absolute: 51 cells/uL (ref 15–500)
Eosinophils Relative: 1 %
HCT: 43.2 % (ref 38.5–50.0)
Hemoglobin: 14 g/dL (ref 13.2–17.1)
Lymphs Abs: 1413 cells/uL (ref 850–3900)
MCH: 29.2 pg (ref 27.0–33.0)
MCHC: 32.4 g/dL (ref 32.0–36.0)
MCV: 90 fL (ref 80.0–100.0)
MPV: 10.4 fL (ref 7.5–12.5)
Monocytes Relative: 9.4 %
Neutro Abs: 3116 cells/uL (ref 1500–7800)
Neutrophils Relative %: 61.1 %
Platelets: 238 10*3/uL (ref 140–400)
RBC: 4.8 10*6/uL (ref 4.20–5.80)
RDW: 12.6 % (ref 11.0–15.0)
Total Lymphocyte: 27.7 %
WBC: 5.1 10*3/uL (ref 3.8–10.8)

## 2019-08-22 NOTE — Telephone Encounter (Signed)
Phone message --lab order.

## 2019-08-25 LAB — STOOL CULTURE
MICRO NUMBER:: 10705824
MICRO NUMBER:: 10705825
MICRO NUMBER:: 10705826
SHIGA RESULT:: NOT DETECTED
SPECIMEN QUALITY:: ADEQUATE
SPECIMEN QUALITY:: ADEQUATE
SPECIMEN QUALITY:: ADEQUATE

## 2019-08-25 LAB — C. DIFFICILE GDH AND TOXIN A/B
GDH ANTIGEN: NOT DETECTED
MICRO NUMBER:: 10705694
SPECIMEN QUALITY:: ADEQUATE
TOXIN A AND B: NOT DETECTED

## 2019-09-05 ENCOUNTER — Other Ambulatory Visit
Admission: RE | Admit: 2019-09-05 | Discharge: 2019-09-05 | Disposition: A | Discharge status: Home / Self Care | Payer: Medicare Other | Source: Ambulatory Visit | Attending: Gastroenterology | Admitting: Gastroenterology

## 2019-09-05 ENCOUNTER — Other Ambulatory Visit: Payer: Self-pay

## 2019-09-05 DIAGNOSIS — Z01812 Encounter for preprocedural laboratory examination: Secondary | ICD-10-CM | POA: Insufficient documentation

## 2019-09-05 DIAGNOSIS — Z20822 Contact with and (suspected) exposure to covid-19: Secondary | ICD-10-CM | POA: Insufficient documentation

## 2019-09-05 LAB — SARS CORONAVIRUS 2 (TAT 6-24 HRS): SARS Coronavirus 2: NEGATIVE

## 2019-09-06 ENCOUNTER — Encounter: Payer: Self-pay | Admitting: *Deleted

## 2019-09-09 ENCOUNTER — Encounter: Payer: Self-pay | Admitting: *Deleted

## 2019-09-09 ENCOUNTER — Ambulatory Visit: Payer: Medicare Other | Admitting: Registered Nurse

## 2019-09-09 ENCOUNTER — Encounter
Admission: RE | Disposition: A | Discharge status: Home / Self Care | Payer: Self-pay | Source: Home / Self Care | Attending: Gastroenterology

## 2019-09-09 ENCOUNTER — Ambulatory Visit
Admission: RE | Admit: 2019-09-09 | Discharge: 2019-09-09 | Disposition: A | Discharge status: Home / Self Care | Payer: Medicare Other | Attending: Gastroenterology | Admitting: Gastroenterology

## 2019-09-09 ENCOUNTER — Other Ambulatory Visit
Admission: RE | Admit: 2019-09-09 | Discharge: 2019-09-09 | Disposition: A | Discharge status: Home / Self Care | Payer: Medicare Other | Source: Ambulatory Visit | Attending: Gastroenterology | Admitting: Gastroenterology

## 2019-09-09 DIAGNOSIS — K2970 Gastritis, unspecified, without bleeding: Secondary | ICD-10-CM | POA: Insufficient documentation

## 2019-09-09 DIAGNOSIS — Z79899 Other long term (current) drug therapy: Secondary | ICD-10-CM | POA: Diagnosis not present

## 2019-09-09 DIAGNOSIS — M1990 Unspecified osteoarthritis, unspecified site: Secondary | ICD-10-CM | POA: Diagnosis not present

## 2019-09-09 DIAGNOSIS — Z8673 Personal history of transient ischemic attack (TIA), and cerebral infarction without residual deficits: Secondary | ICD-10-CM | POA: Diagnosis not present

## 2019-09-09 DIAGNOSIS — F329 Major depressive disorder, single episode, unspecified: Secondary | ICD-10-CM | POA: Insufficient documentation

## 2019-09-09 DIAGNOSIS — K317 Polyp of stomach and duodenum: Secondary | ICD-10-CM | POA: Diagnosis not present

## 2019-09-09 DIAGNOSIS — K529 Noninfective gastroenteritis and colitis, unspecified: Secondary | ICD-10-CM | POA: Diagnosis present

## 2019-09-09 DIAGNOSIS — K5730 Diverticulosis of large intestine without perforation or abscess without bleeding: Secondary | ICD-10-CM | POA: Diagnosis not present

## 2019-09-09 HISTORY — PX: ESOPHAGOGASTRODUODENOSCOPY (EGD) WITH PROPOFOL: SHX5813

## 2019-09-09 HISTORY — DX: Cerebral infarction, unspecified: I63.9

## 2019-09-09 HISTORY — PX: COLONOSCOPY WITH PROPOFOL: SHX5780

## 2019-09-09 SURGERY — COLONOSCOPY WITH PROPOFOL
Anesthesia: General

## 2019-09-09 MED ORDER — PROPOFOL 10 MG/ML IV BOLUS
INTRAVENOUS | Status: DC | PRN
  Administered 2019-09-09: 80 mg via INTRAVENOUS

## 2019-09-09 MED ORDER — PROPOFOL 500 MG/50ML IV EMUL
INTRAVENOUS | Status: DC | PRN
  Administered 2019-09-09: 175 ug/kg/min via INTRAVENOUS

## 2019-09-09 MED ORDER — SODIUM CHLORIDE 0.9 % IV SOLN
INTRAVENOUS | Status: DC
  Administered 2019-09-09: 1000 mL via INTRAVENOUS

## 2019-09-09 MED ORDER — LIDOCAINE HCL (CARDIAC) PF 100 MG/5ML IV SOSY
PREFILLED_SYRINGE | INTRAVENOUS | Status: DC | PRN
  Administered 2019-09-09: 80 mg via INTRAVENOUS

## 2019-09-09 MED ORDER — PHENYLEPHRINE HCL (PRESSORS) 10 MG/ML IV SOLN
INTRAVENOUS | Status: DC | PRN
  Administered 2019-09-09: 100 ug via INTRAVENOUS

## 2019-09-09 NOTE — H&P (Signed)
Outpatient short stay form Pre-procedure 09/09/2019 12:51 PM Raylene Miyamoto MD, MPH  Primary Physician: Dr. Parks Ranger  Reason for visit:  Diarrhea  History of present illness:  69 y/o gentleman who I saw in clinic last week here for EGD/Colonoscopy for diarrhea that started after possible food poisoning. No abdominal surgeries, blood thinners, or family history of GI malignancies.    Current Facility-Administered Medications:    0.9 %  sodium chloride infusion, , Intravenous, Continuous, Nickolaus Bordelon, Hilton Cork, MD, Last Rate: 20 mL/hr at 09/09/19 1232, 1,000 mL at 09/09/19 1232  Medications Prior to Admission  Medication Sig Dispense Refill Last Dose   albuterol (VENTOLIN HFA) 108 (90 Base) MCG/ACT inhaler Inhale 2 puffs into the lungs every 4 (four) hours as needed for wheezing or shortness of breath (cough). 6.7 g 1 Past Month at Unknown time   baclofen (LIORESAL) 10 MG tablet Take 0.5-1 tablets (5-10 mg total) by mouth 3 (three) times daily as needed for muscle spasms. 30 each 1 Past Month at Unknown time   dicyclomine (BENTYL) 10 MG capsule Take 1 capsule (10 mg total) by mouth 4 (four) times daily -  before meals and at bedtime. As needed abdominal cramping 30 capsule 0 Past Month at Unknown time   fexofenadine (ALLEGRA) 180 MG tablet Take 180 mg by mouth daily.    Past Week at Unknown time   sildenafil (REVATIO) 20 MG tablet Take 1-5 pills about 30 min prior to sex. Start with 1 and increase as needed. 30 tablet 2 Past Month at Unknown time   solifenacin (VESICARE) 10 MG tablet Take by mouth daily.   Past Week at Unknown time   tamsulosin (FLOMAX) 0.4 MG CAPS capsule Take 1 capsule (0.4 mg total) by mouth daily. 90 capsule 1 Past Month at Unknown time   traZODone (DESYREL) 50 MG tablet TAKE 1 TABLET BY MOUTH AT BEDTIME 90 tablet 1 Past Week at Unknown time   zolpidem (AMBIEN CR) 6.25 MG CR tablet Take 1 tablet (6.25 mg total) by mouth at bedtime as needed for sleep. 30 tablet 0  Past Week at Unknown time     No Known Allergies   Past Medical History:  Diagnosis Date   Depression    Stroke [facility])     Review of systems:  Otherwise negative.    Physical Exam  Gen: Alert, oriented. Appears stated age.  HEENT: [place]/AT. PERRLA. Lungs: no respiratory distress Abd: soft, benign, no masses. BS+ Ext: No edema. Pulses 2+    Planned procedures: Proceed with EGD/colonoscopy. The patient understands the nature of the planned procedure, indications, risks, alternatives and potential complications including but not limited to bleeding, infection, perforation, damage to internal organs and possible oversedation/side effects from anesthesia. The patient agrees and gives consent to proceed.  Please refer to procedure notes for findings, recommendations and patient disposition/instructions.     Raylene Miyamoto MD, MPH Gastroenterology 09/09/2019  12:51 PM

## 2019-09-09 NOTE — Interval H&P Note (Signed)
History and Physical Interval Note:  09/09/2019 12:53 PM  Tony Franco  has presented today for surgery, with the diagnosis of CHRONIC DIARRHEA.  The various methods of treatment have been discussed with the patient and family. After consideration of risks, benefits and other options for treatment, the patient has consented to  Procedure(s): COLONOSCOPY WITH PROPOFOL (N/A) ESOPHAGOGASTRODUODENOSCOPY (EGD) WITH PROPOFOL (N/A) as a surgical intervention.  The patient's history has been reviewed, patient examined, no change in status, stable for surgery.  I have reviewed the patient's chart and labs.  Questions were answered to the patient's satisfaction.     Lesly Rubenstein  Ok to proceed with EGD/Colonoscopy

## 2019-09-09 NOTE — Anesthesia Postprocedure Evaluation (Signed)
Anesthesia Post Note  Patient: Tony Franco  Procedure(s) Performed: COLONOSCOPY WITH PROPOFOL (N/A ) ESOPHAGOGASTRODUODENOSCOPY (EGD) WITH PROPOFOL (N/A )  Patient location during evaluation: Endoscopy Anesthesia Type: General Level of consciousness: awake and alert and oriented Pain management: pain level controlled Vital Signs Assessment: post-procedure vital signs reviewed and stable Respiratory status: spontaneous breathing, nonlabored ventilation and respiratory function stable Cardiovascular status: blood pressure returned to baseline and stable Postop Assessment: no signs of nausea or vomiting Anesthetic complications: no   No complications documented.   Last Vitals:  Vitals:   09/09/19 1219 09/09/19 1335  BP: 124/76 97/70  Pulse: 85   Resp: 17   Temp: 36.6 C (!) 36.1 C  SpO2: 96%     Last Pain:  Vitals:   09/09/19 1355  TempSrc:   PainSc: 0-No pain                 Dia Jefferys

## 2019-09-09 NOTE — Op Note (Signed)
[facility] Gastroenterology Patient Name: Tony Franco Procedure Date: 09/09/2019 12:59 PM MRN: [id number] Account #: [id number] Date of Birth: [birth year] Admit Type: Outpatient Age: 69 Room: [facility] ENDO ROOM 1 Gender: Male Note Status: Finalized Procedure:             Upper GI endoscopy Indications:           Generalized abdominal pain, Diarrhea Providers:             Andrey Farmer MD, MD Referring MD:          Olin Hauser (Referring MD) Medicines:             Monitored Anesthesia Care Complications:         No immediate complications. Estimated blood loss:                         Minimal. Procedure:             Pre-Anesthesia Assessment:                        - Prior to the procedure, a History and Physical was                         performed, and patient medications and allergies were                         reviewed. The patient is competent. The risks and                         benefits of the procedure and the sedation options and                         risks were discussed with the patient. All questions                         were answered and informed consent was obtained.                         Patient identification and proposed procedure were                         verified by the physician, the nurse, the anesthetist                         and the technician in the endoscopy suite. Mental                         Status Examination: alert and oriented. Airway                         Examination: normal oropharyngeal airway and neck                         mobility. Respiratory Examination: clear to                         auscultation. CV Examination: normal. Prophylactic  Antibiotics: The patient does not require prophylactic                         antibiotics. Prior Anticoagulants: The patient has                         taken no previous anticoagulant or antiplatelet                         agents. ASA  Grade Assessment: II - A patient with mild                         systemic disease. After reviewing the risks and                         benefits, the patient was deemed in satisfactory                         condition to undergo the procedure. The anesthesia                         plan was to use monitored anesthesia care (MAC).                         Immediately prior to administration of medications,                         the patient was re-assessed for adequacy to receive                         sedatives. The heart rate, respiratory rate, oxygen                         saturations, blood pressure, adequacy of pulmonary                         ventilation, and response to care were monitored                         throughout the procedure. The physical status of the                         patient was re-assessed after the procedure.                        After obtaining informed consent, the endoscope was                         passed under direct vision. Throughout the procedure,                         the patient's blood pressure, pulse, and oxygen                         saturations were monitored continuously. The Endoscope                         was introduced through the mouth, and advanced to the  second part of duodenum. The upper GI endoscopy was                         accomplished without difficulty. The patient tolerated                         the procedure well. Findings:      The examined esophagus was normal.      A few 3 to 6 mm sessile polyps with no stigmata of recent bleeding were       found in the gastric body. The polyps were sampled with a cold snare.       Resection and retrieval were complete. Estimated blood loss was minimal.      The examined duodenum was normal. Biopsies for histology were taken with       a cold forceps for evaluation of celiac disease. Estimated blood loss       was minimal. Impression:            -  Normal esophagus.                        - A few gastric polyps. Sampled. Resected and                         retrieved.                        - Normal examined duodenum. Biopsied. Recommendation:        - Discharge patient to home.                        - Resume previous diet.                        - Continue present medications.                        - Await pathology results.                        - Perform a colonoscopy today. Procedure Code(s):     --- Professional ---                        [phone], Esophagogastroduodenoscopy, flexible,                         transoral; with removal of tumor(s), polyp(s), or                         other lesion(s) by snare technique Diagnosis Code(s):     --- Professional ---                        K31.7, Polyp of stomach and duodenum                        R10.84, Generalized abdominal pain                        R19.7, Diarrhea, unspecified CPT copyright 2019 American Medical Association. All rights reserved. The codes documented in this report are preliminary and upon  coder review may  be revised to meet current compliance requirements. Andrey Farmer, MD Andrey Farmer MD, MD 09/09/2019 1:35:44 PM Number of Addenda: 0 Note Initiated On: 09/09/2019 12:59 PM Estimated Blood Loss:  Estimated blood loss was minimal.      [facility]

## 2019-09-09 NOTE — Transfer of Care (Signed)
Immediate Anesthesia Transfer of Care Note  Patient: Tony Franco  Procedure(s) Performed: COLONOSCOPY WITH PROPOFOL (N/A ) ESOPHAGOGASTRODUODENOSCOPY (EGD) WITH PROPOFOL (N/A )  Patient Location: PACU and Endoscopy Unit  Anesthesia Type:General  Level of Consciousness: drowsy  Airway & Oxygen Therapy: Patient Spontanous Breathing  Post-op Assessment: Report given to RN and Post -op Vital signs reviewed and stable  Post vital signs: Reviewed and stable  Last Vitals:  Vitals Value Taken Time  BP 97/70 09/09/19 1335  Temp 36.1 C 09/09/19 1335  Pulse 74 09/09/19 1336  Resp 13 09/09/19 1336  SpO2 97 % 09/09/19 1336  Vitals shown include unvalidated device data.  Last Pain:  Vitals:   09/09/19 1335  TempSrc: Temporal  PainSc:          Complications: No complications documented.

## 2019-09-09 NOTE — Anesthesia Preprocedure Evaluation (Signed)
Anesthesia Evaluation  Patient identified by MRN, date of birth, ID band Patient awake    Reviewed: Allergy & Precautions, NPO status , Patient's Chart, lab work & pertinent test results  History of Anesthesia Complications Negative for: history of anesthetic complications  Airway Mallampati: II  TM Distance: >3 FB Neck ROM: Full    Dental  (+) Poor Dentition   Pulmonary neg pulmonary ROS, neg sleep apnea, neg COPD,    breath sounds clear to auscultation- rhonchi (-) wheezing      Cardiovascular Exercise Tolerance: Good (-) hypertension(-) angina(-) CAD, (-) Past MI, (-) Cardiac Stents and (-) CABG  Rhythm:Regular Rate:Normal - Systolic murmurs and - Diastolic murmurs    Neuro/Psych PSYCHIATRIC DISORDERS Depression CVA, No Residual Symptoms    GI/Hepatic negative GI ROS, Neg liver ROS,   Endo/Other  negative endocrine ROSneg diabetes  Renal/GU negative Renal ROS     Musculoskeletal  (+) Arthritis ,   Abdominal (+) - obese,   Peds  Hematology negative hematology ROS (+)   Anesthesia Other Findings Past Medical History: No date: Depression No date: Stroke [facility])   Reproductive/Obstetrics                             Anesthesia Physical Anesthesia Plan  ASA: II  Anesthesia Plan: General   Post-op Pain Management:    Induction: Intravenous  PONV Risk Score and Plan: 1 and Propofol infusion  Airway Management Planned: Natural Airway  Additional Equipment:   Intra-op Plan:   Post-operative Plan:   Informed Consent: I have reviewed the patients History and Physical, chart, labs and discussed the procedure including the risks, benefits and alternatives for the proposed anesthesia with the patient or authorized representative who has indicated his/her understanding and acceptance.     Dental advisory given  Plan Discussed with: CRNA and Anesthesiologist  Anesthesia Plan  Comments:         Anesthesia Quick Evaluation

## 2019-09-09 NOTE — Op Note (Signed)
[facility] Gastroenterology Patient Name: Tony Franco Procedure Date: 09/09/2019 12:58 PM MRN: [id number] Account #: [id number] Date of Birth: 1950-10-29 Admit Type: Outpatient Age: 69 Room: [facility] ENDO ROOM 1 Gender: Male Note Status: Finalized Procedure:             Colonoscopy Indications:           Generalized abdominal pain, Chronic diarrhea Providers:             Andrey Farmer MD, MD Referring MD:          Olin Hauser (Referring MD) Medicines:             Monitored Anesthesia Care Complications:         No immediate complications. Estimated blood loss:                         Minimal. Procedure:             Pre-Anesthesia Assessment:                        - Prior to the procedure, a History and Physical was                         performed, and patient medications and allergies were                         reviewed. The patient is competent. The risks and                         benefits of the procedure and the sedation options and                         risks were discussed with the patient. All questions                         were answered and informed consent was obtained.                         Patient identification and proposed procedure were                         verified by the physician, the nurse, the anesthetist                         and the technician in the endoscopy suite. Mental                         Status Examination: alert and oriented. Airway                         Examination: normal oropharyngeal airway and neck                         mobility. Respiratory Examination: clear to                         auscultation. CV Examination: normal. Prophylactic  Antibiotics: The patient does not require prophylactic                         antibiotics. Prior Anticoagulants: The patient has                         taken no previous anticoagulant or antiplatelet                         agents. ASA  Grade Assessment: II - A patient with mild                         systemic disease. After reviewing the risks and                         benefits, the patient was deemed in satisfactory                         condition to undergo the procedure. The anesthesia                         plan was to use monitored anesthesia care (MAC).                         Immediately prior to administration of medications,                         the patient was re-assessed for adequacy to receive                         sedatives. The heart rate, respiratory rate, oxygen                         saturations, blood pressure, adequacy of pulmonary                         ventilation, and response to care were monitored                         throughout the procedure. The physical status of the                         patient was re-assessed after the procedure.                        After obtaining informed consent, the colonoscope was                         passed under direct vision. Throughout the procedure,                         the patient's blood pressure, pulse, and oxygen                         saturations were monitored continuously. The                         Colonoscope was introduced through the anus and  advanced to the the terminal ileum. The colonoscopy                         was performed without difficulty. The patient                         tolerated the procedure well. The quality of the bowel                         preparation was good. Findings:      The perianal and digital rectal examinations were normal.      The terminal ileum appeared normal.      A few small-mouthed diverticula were found in the descending colon.      The colon (entire examined portion) appeared normal. Biopsies for       histology were taken with a cold forceps from the entire colon for       evaluation of microscopic colitis. Estimated blood loss was minimal.      No additional  abnormalities were found on retroflexion. Impression:            - The examined portion of the ileum was normal.                        - Diverticulosis in the descending colon.                        - The entire examined colon is normal. Biopsied. Recommendation:        - Discharge patient to home.                        - Resume previous diet.                        - Continue present medications.                        - Await pathology results.                        - Repeat colonoscopy in 10 years for screening                         purposes.                        - Return to referring physician as previously                         scheduled. Procedure Code(s):     --- Professional ---                        [phone], Colonoscopy, flexible; with biopsy, single or                         multiple Diagnosis Code(s):     --- Professional ---                        R10.84, Generalized abdominal pain  K52.9, Noninfective gastroenteritis and colitis,                         unspecified                        K57.30, Diverticulosis of large intestine without                         perforation or abscess without bleeding CPT copyright 2019 American Medical Association. All rights reserved. The codes documented in this report are preliminary and upon coder review may  be revised to meet current compliance requirements. Andrey Farmer, MD Andrey Farmer MD, MD 09/09/2019 1:39:00 PM Number of Addenda: 0 Note Initiated On: 09/09/2019 12:58 PM Scope Withdrawal Time: 0 hours 12 minutes 8 seconds  Total Procedure Duration: 0 hours 14 minutes 11 seconds  Estimated Blood Loss:  Estimated blood loss was minimal.      [facility]

## 2019-09-12 LAB — SURGICAL PATHOLOGY

## 2019-09-16 ENCOUNTER — Telehealth: Payer: Self-pay | Admitting: Family Medicine

## 2019-09-16 NOTE — Telephone Encounter (Signed)
Patient contacted [place] today 09/16/19 regarding scheduling follow-up to get results from his colonoscopy from [place].  We advised him that he should contact his GI provider, gave him correct contact # and we contacted GI and they were awaiting path report and further results and will notify patient.  We asked him to follow-up with them on further recommendations.  He asks about gluten free as well.  Will follow-up as needed, for now however he should return to GI and get his results and determine next steps in plan.  [name], [facility] 09/16/2019, 5:23 PM

## 2019-10-15 ENCOUNTER — Ambulatory Visit: Payer: Medicare Other | Admitting: Family Medicine

## 2019-10-17 ENCOUNTER — Ambulatory Visit (INDEPENDENT_AMBULATORY_CARE_PROVIDER_SITE_OTHER): Payer: Medicare Other | Admitting: Family Medicine

## 2019-10-17 ENCOUNTER — Ambulatory Visit
Admission: RE | Admit: 2019-10-17 | Discharge: 2019-10-17 | Disposition: A | Discharge status: Home / Self Care | Payer: Medicare Other | Attending: Family Medicine | Admitting: Family Medicine

## 2019-10-17 ENCOUNTER — Ambulatory Visit
Admission: RE | Admit: 2019-10-17 | Discharge: 2019-10-17 | Disposition: A | Discharge status: Home / Self Care | Payer: Medicare Other | Source: Ambulatory Visit | Attending: Family Medicine | Admitting: Family Medicine

## 2019-10-17 ENCOUNTER — Encounter: Payer: Self-pay | Admitting: Family Medicine

## 2019-10-17 ENCOUNTER — Other Ambulatory Visit: Payer: Self-pay

## 2019-10-17 VITALS — BP 96/58 | HR 81 | Temp 97.7°F | Resp 16 | Ht 72.0 in | Wt 181.6 lb

## 2019-10-17 DIAGNOSIS — M1712 Unilateral primary osteoarthritis, left knee: Secondary | ICD-10-CM

## 2019-10-17 DIAGNOSIS — G8929 Other chronic pain: Secondary | ICD-10-CM

## 2019-10-17 DIAGNOSIS — M25562 Pain in left knee: Secondary | ICD-10-CM

## 2019-10-17 DIAGNOSIS — F5101 Primary insomnia: Secondary | ICD-10-CM | POA: Diagnosis not present

## 2019-10-17 MED ORDER — DICLOFENAC SODIUM 1 % EX GEL: 2.0000 g | Freq: Four times a day (QID) | CUTANEOUS | 2 refills | Status: DC | PRN

## 2019-10-17 MED ORDER — ZOLPIDEM TARTRATE ER 6.25 MG PO TBCR: 6.2500 mg | EXTENDED_RELEASE_TABLET | Freq: Every evening | ORAL | 2 refills | Status: DC | PRN

## 2019-10-17 NOTE — Progress Notes (Signed)
Subjective:    Patient ID: Tony Franco, male    DOB: Nov 19, 1950, 69 y.o.   MRN: [id number]  Tony Franco is a 69 y.o. male presenting on 10/17/2019 for Knee Pain (Left side onset couple of months getting worst) and Diarrhea (seen in 08/2019 and seen by GI --but still has diarrhea going on--as per GI takes time to heal but patient is uncomfortable and effects work)   HPI   Left Knee Pain History prior bone spur, arthroscopic knee surgery >30 years ago, has done very well since then, now recent flare up past few months, episodic worsening, worse if cross legs, worse if prolong seated, increase stiffness Working with personal trainer  Diarrhea - significantly improved now. 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 No polyps, has diverticulosis, return 10 year for screening colonoscopy. - however he still reports breakthrough symptoms with some recurrent flares of loose / diarrhea and flare up, can come in episodic can be erratic or unpredictable, he says bowel movements are often reasonably solid but can be mixed with looser stool or liquid. - He is taking Imodium 1 cap daily - His next apt 11/06/19 with GI  He has returned to work plan for full time  Insomnia Chronic Depression, recurrent mild See prior note for background details, known significant family and other life stressors. OFF Hydroxyzine, Duloxetine Tried melatonin See PHQ GAD below Takes Trazodone nightly, can take 1-2 hours to fall asleep Takes Ambien PRN if traveling can feel groggy after   Depression screen [facility] 2/9 10/17/2019 02/21/2019 11/22/2018  Decreased Interest 2 2 0  Down, Depressed, Hopeless 3 2 0  PHQ - 2 Score 5 4 0  Altered sleeping 3 3 -  Tired, decreased energy 3 3 -  Change in appetite 1 0 -  Feeling bad or failure about yourself  1 1 -  Trouble concentrating 0 1 -  Moving slowly or fidgety/restless 0 0 -  Suicidal thoughts 1 0 -  PHQ-9 Score 14 12 -    Difficult doing work/chores Somewhat difficult Not difficult at all -  Some encounter information is confidential and restricted. Go to Review Flowsheets activity to see all data.  Some recent data might be hidden   GAD 7 : Generalized Anxiety Score 10/17/2019 07/28/2017  Nervous, Anxious, on Edge 2 2  Control/stop worrying 2 3  Worry too much - different things 1 2  Trouble relaxing 0 3  Restless 1 0  Easily annoyed or irritable 1 2  Afraid - awful might happen 1 3  Total GAD 7 Score 8 15  Anxiety Difficulty Somewhat difficult Extremely difficult     Social History   Tobacco Use  . Smoking status: Never Smoker  . Smokeless tobacco: Never Used  Vaping Use  . Vaping Use: Never used  Substance Use Topics  . Alcohol use: Yes    Alcohol/week: 3.0 standard drinks    Types: 3 Glasses of wine per week  . Drug use: Yes    Types: Marijuana, Amphetamines    Comment: in past--only marijuana still use occ  Amphetamines    Review of Systems Per HPI unless specifically indicated above     Objective:    BP (!) 96/58   Pulse 81   Temp 97.7 F (36.5 C) (Temporal)   Resp 16   Ht 6' (1.829 m)   Wt 181 lb 9.6 oz (82.4 kg)   SpO2 97%  BMI 24.63 kg/m   Wt Readings from Last 3 Encounters:  10/17/19 181 lb 9.6 oz (82.4 kg)  09/09/19 176 lb (79.8 kg)  08/15/19 175 lb (79.4 kg)    Physical Exam Vitals and nursing note reviewed.  Constitutional:      General: He is not in acute distress.    Appearance: He is well-developed. He is not diaphoretic.     Comments: Well-appearing, comfortable, cooperative  HENT:     Head: Normocephalic and atraumatic.  Eyes:     General:        Right eye: No discharge.        Left eye: No discharge.     Conjunctiva/sclera: Conjunctivae normal.  Cardiovascular:     Rate and Rhythm: Normal rate.  Pulmonary:     Effort: Pulmonary effort is normal.  Musculoskeletal:     Comments: Left Knee Inspection: Normal appearance and symmetrical. No  ecchymosis or effusion. Palpation: Non-tender. Mild +TTP Left knee only medial joint line. No crepitus ROM: Full active ROM bilaterally Special Testing: Lachman / Valgus/Varus tests negative with intact ligaments (ACL, MCL, LCL). Strength: 5/5 intact knee flex/ext, ankle dorsi/plantarflex Neurovascular: distally intact sensation light touch and pulses   Skin:    General: Skin is warm and dry.     Findings: No erythema or rash.  Neurological:     Mental Status: He is alert and oriented to person, place, and time.  Psychiatric:        Behavior: Behavior normal.     Comments: Well groomed, good eye contact, normal speech and thoughts    I have personally reviewed the radiology report from 10/17/19 on Left Knee X-ray.  CLINICAL DATA:  Left knee pain, no known injury, initial encounter  EXAM: LEFT KNEE - COMPLETE 4+ VIEW  COMPARISON:  None.  FINDINGS: Mild degenerative change is noted particularly in the medial joint space. No joint effusion is seen. No acute fracture or dislocation is noted.  IMPRESSION: Degenerative change without acute abnormality.   Electronically Signed   By: Inez Catalina M.D.   On: 10/17/2019 16:38  Results for orders placed or performed during the hospital encounter of 09/09/19  Surgical pathology  Result Value Ref Range   SURGICAL PATHOLOGY      SURGICAL PATHOLOGY * THIS IS AN ADDENDUM REPORT * CASE: [id number] PATIENT: Tony Franco Surgical Pathology Report *Addendum   Reason for Addendum #1:  Immunohistochemistry results  Specimen Submitted: A. Stomach polyps x2; cold snare B. Duodenum; cbx C. Duodenum bulb; cbx D. Stomach; cbx E. Colon, random; cbx  Clinical History: Chronic diarrhea.  Gastric polyps, diverticulosis      DIAGNOSIS: A.  STOMACH POLYPS X2; COLD SNARE: - FUNDIC GLAND POLYPS (2). - NEGATIVE FOR H. PYLORI, DYSPLASIA, AND MALIGNANCY.  B.  DUODENUM; COLD BIOPSY: - UNREMARKABLE DUODENAL MUCOSA. - VILLOUS  ARCHITECTURE IS PRESERVED. - NEGATIVE FOR FEATURES OF CELIAC, PARASITE, DYSPLASIA, AND MALIGNANCY.  C.  DUODENAL BULB; COLD BIOPSY: - DUODENAL MUCOSA WITH PROMINENT BRUNNER'S GLANDS, REACTIVE LYMPHOID AGGREGATE, AND FOVEOLAR METAPLASIA, CONSISTENT WITH REACTIVE DUODENOPATHY. - NEGATIVE FOR FEATURES OF CELIAC, PARASITE, DYSPLASIA, AND MALIGNANCY.  D.   STOMACH; COLD BIOPSY: - ANTRAL MUCOSA WITH MILD REACTIVE GASTRITIS. - UNREMARKABLE OXYNTIC MUCOSA. - NEGATIVE FOR H. PYLORI, DYSPLASIA, AND MALIGNANCY.  E.  COLON, RANDOM; COLD BIOPSY: - MILD TO FOCALLY MODERATE ACTIVE COLITIS INVOLVING ALL OF THE BIOPSY FRAGMENTS, SEE COMMENT. - IHC FOR CMV AND HSV WILL BE REPORTED IN AN ADDENDUM. - NEGATIVE FOR ARCHITECTURAL FEATURES OF  CHRONICITY, DYSPLASIA, AND MALIGNANCY.  Comment: The random colon biopsies (part E) demonstrate multiple fragments of colonic mucosa with mild to focally moderate active colitis involving all of the biopsy fragments.  Reactive lymphoid aggregates are present. Subepithelial collagen deposition and architectural features of chronicity are not identified. The patient's history of chronic diarrhea is noted.  The differential diagnosis for the histologic findings includes resolving infectious colitis and medication induced mucosal injury. While early idiopathic inflammatory bowel disease is a diagnosti c consideration it is considered less likely.  GROSS DESCRIPTION: A. Labeled: Gastric polyps, cold snare x2 Received: Formalin Tissue fragment(s): 2 Size: Range from 0.4-0.6 cm Description: Tan soft tissue fragments Entirely submitted in 1 cassette.  B. Labeled: Duodenum cbxs, rule out celiac disease Received: Formalin Tissue fragment(s): 2 Size: Range from 0.3-0.4 cm Description: Tan soft tissue fragments Entirely submitted in 1 cassette.  C. Labeled: Duodenal bulb cbxs, rule out celiac disease Received: Formalin Tissue fragment(s): Multiple Size: Aggregate,  0.5 x 0.4 x 0.2 cm Description: Tan soft tissue fragments Entirely submitted in 1 cassette.  D. Labeled: Gastric cbxs, rule out H. pylori Received: Formalin Tissue fragment(s): Multiple Size: Aggregate, 1 x 0.6 x 0.3 cm Description: Tan soft tissue fragments Entirely submitted in 1 cassette.  E. Labeled: Random colon cbxs, rule out microscopic colitis Received: Formalin Tissue fragment(s): Multiple  Size: Aggregate, 1.7 x 0.7 x 0.3 cm Description: Tan soft tissue fragments Entirely submitted in 1 cassette.   Final Diagnosis performed by Quay Burow, MD.   Electronically signed 09/11/2019 10:31:19AM The electronic signature indicates that the named Attending Pathologist has evaluated the specimen Technical component performed at [facility], [address] [place], [place] [address] Lab: [phone] Dir: Rush Farmer, MD, MMM  Professional component performed at [facility], [facility], [place], [place], [place] [address] Lab: [phone] Dir: Dellia Nims. Rubinas, MD  ADDENDUM: Immunohistochemical stains for CMV and HSV I/II were performed on part E and are negative.  IHC slides were prepared by [facility], [place], [place]. All controls stained appropriately.  This test was developed and its performance characteristics determined by [facility]. It has not been cleared or approved by the [place] Food and Drug Adminis tration. The FDA does not require this test to go through premarket FDA review. This test is used for clinical purposes. It should not be regarded as investigational or for research. This laboratory is certified under the Clinical Laboratory Improvement Amendments (CLIA) as qualified to perform high complexity clinical laboratory testing.         Addendum #1 performed by Quay Burow, MD.   Electronically signed 09/12/2019 1:41:31PM The electronic signature indicates that the named Attending Pathologist has  evaluated the specimen Technical component performed at [facility], [address] [place], [place] [address] Lab: [phone] Dir: Rush Farmer, MD, MMM  Professional component performed at [facility], [facility], [place], [place], [place] [address] Lab: [phone] Dir: Dellia Nims. Rubinas, MD       Assessment & Plan:   Problem List Items Addressed This Visit    Insomnia    Chronic depression but Mood is stable, overall seems fairly controlled on Trazodone  Some improvement on Trazodone but still has difficulty with sleep maintenance, he tried higher dose trazodone but prefers to stay on lower dose 50mg   Improved on Ambien CR 6.25 - sleep up to 4 hours, not taking nightly has some grogginess side effect next day, request refill, review PDMP - Future consider other options  such as Dayvigo      Relevant Medications   zolpidem (AMBIEN CR) 6.25 MG CR tablet    Other Visit Diagnoses    Chronic pain of left knee    -  Primary   Relevant Medications   diclofenac Sodium (VOLTAREN) 1 % GEL   Other Relevant Orders   DG Knee Complete 4 Views Left (Completed)   Primary osteoarthritis of left knee       Relevant Medications   diclofenac Sodium (VOLTAREN) 1 % GEL   Other Relevant Orders   DG Knee Complete 4 Views Left (Completed)      Subacute on chronic L medial vs generalized Knee pain without swelling without known injury or trauma  Suspected likely due to underlying osteoarthritis / DJD with known OA/DJD in other joints. No concern for meniscus injury. No mechanical locking. No instability Prior arthroscopic surgery bone spur 30 years ago - Inadequate conservative therapy   Plan: 1. Start topical anti-inflammatory Voltaren NSAID - rx sent, goodrx, or otc - use up to 2-4 times daily PRN 2. Continue Tylenol 500-1000mg  per dose TID PRN breakthrough 3. RICE therapy (rest, ice, compression, elevation) for swelling, activity modification 4. Knee x-rays ordered  eval for osteoarthritis 5. Follow-up 4 weeks, if still worsening, consider steroid injection vs PT vs referral to Ortho, consider oral nsaid trial  #Diarrhea Followed by [facility] Return to them as scheduled. No changes today.   Meds ordered this encounter  Medications  . diclofenac Sodium (VOLTAREN) 1 % GEL    Sig: Apply 2 g topically 4 (four) times daily as needed (arthritis knee pain).    Dispense:  100 g    Refill:  2  . zolpidem (AMBIEN CR) 6.25 MG CR tablet    Sig: Take 1 tablet (6.25 mg total) by mouth at bedtime as needed for sleep.    Dispense:  30 tablet    Refill:  2      Follow up plan: Return in about 4 weeks (around 11/14/2019), or if symptoms worsen or fail to improve, for knee pain.   [name], DO [facility] 10/17/2019, 11:13 AM

## 2019-10-17 NOTE — Patient Instructions (Addendum)
Thank you for coming to the office today.  X-ray of Left knee today  Start topical OTC Voltaren (diclofenac) gel anti inflammatory use 2-4 times a day as needed for pain and inflammation.  Recommend to start taking Tylenol Extra Strength 500mg  tabs - take 1 to 2 tabs per dose (max 1000mg ) every 6-8 hours for pain (take regularly, don't skip a dose for next 7 days), max 24 hour daily dose is 6 tablets or 3000mg . In the future you can repeat the same everyday Tylenol course for 1-2 weeks at a time.   Stay tuned for x-ray results.  May consider return to Orthopedic or Knee injection.  -----------------------------------------------------------------------------------     Please schedule a Follow-up Appointment to: Return in about 4 weeks (around 11/14/2019), or if symptoms worsen or fail to improve, for knee pain.  If you have any other questions or concerns, please feel free to call the office or send a message through [place]. You may also schedule an earlier appointment if necessary.  Additionally, you may be receiving a survey about your experience at our office within a few days to 1 week by e-mail or mail. We value your feedback.  [name], DO [facility]

## 2019-10-17 NOTE — Assessment & Plan Note (Signed)
Chronic depression but Mood is stable, overall seems fairly controlled on Trazodone  Some improvement on Trazodone but still has difficulty with sleep maintenance, he tried higher dose trazodone but prefers to stay on lower dose 50mg   Improved on Ambien CR 6.25 - sleep up to 4 hours, not taking nightly has some grogginess side effect next day, request refill, review PDMP - Future consider other options such as Dayvigo

## 2019-10-27 ENCOUNTER — Other Ambulatory Visit: Payer: Self-pay | Admitting: Family Medicine

## 2019-10-27 DIAGNOSIS — N401 Enlarged prostate with lower urinary tract symptoms: Secondary | ICD-10-CM

## 2019-10-27 DIAGNOSIS — N138 Other obstructive and reflux uropathy: Secondary | ICD-10-CM

## 2019-10-27 NOTE — Telephone Encounter (Signed)
Requested Prescriptions  Pending Prescriptions Disp Refills   tamsulosin (FLOMAX) 0.4 MG CAPS capsule [Pharmacy Med Name: Tamsulosin HCl 0.4 MG Oral Capsule] 90 capsule 3    Sig: Take 1 capsule by mouth once daily     Urology: Alpha-Adrenergic Blocker Passed - 10/27/2019  7:37 AM      Passed - Last BP in normal range    BP Readings from Last 1 Encounters:  10/17/19 (!) 96/58         Passed - Valid encounter within last 12 months    Recent Outpatient Visits          1 week ago Chronic pain of left knee   [place], DO   2 months ago Diarrhea, unspecified type   [place], DO   4 months ago Acute non-recurrent frontal sinusitis   [place], DO   8 months ago Mild intermittent asthma with exacerbation   [place], DO   11 months ago Mild intermittent asthma with exacerbation   [place], DO

## 2019-12-13 ENCOUNTER — Other Ambulatory Visit: Payer: Self-pay

## 2019-12-13 ENCOUNTER — Ambulatory Visit: Payer: Self-pay

## 2019-12-13 ENCOUNTER — Emergency Department
Admission: EM | Admit: 2019-12-13 | Discharge: 2019-12-13 | Disposition: A | Discharge status: Home / Self Care | Payer: Medicare Other | Attending: Emergency Medicine | Admitting: Emergency Medicine

## 2019-12-13 ENCOUNTER — Ambulatory Visit: Payer: Self-pay | Admitting: *Deleted

## 2019-12-13 DIAGNOSIS — R197 Diarrhea, unspecified: Secondary | ICD-10-CM | POA: Diagnosis not present

## 2019-12-13 DIAGNOSIS — E860 Dehydration: Secondary | ICD-10-CM | POA: Insufficient documentation

## 2019-12-13 DIAGNOSIS — E876 Hypokalemia: Secondary | ICD-10-CM | POA: Insufficient documentation

## 2019-12-13 DIAGNOSIS — R0789 Other chest pain: Secondary | ICD-10-CM

## 2019-12-13 DIAGNOSIS — R45851 Suicidal ideations: Secondary | ICD-10-CM | POA: Diagnosis not present

## 2019-12-13 DIAGNOSIS — F32A Depression, unspecified: Secondary | ICD-10-CM | POA: Insufficient documentation

## 2019-12-13 DIAGNOSIS — R309 Painful micturition, unspecified: Secondary | ICD-10-CM | POA: Insufficient documentation

## 2019-12-13 DIAGNOSIS — F332 Major depressive disorder, recurrent severe without psychotic features: Secondary | ICD-10-CM

## 2019-12-13 DIAGNOSIS — A071 Giardiasis [lambliasis]: Secondary | ICD-10-CM

## 2019-12-13 DIAGNOSIS — F331 Major depressive disorder, recurrent, moderate: Secondary | ICD-10-CM | POA: Insufficient documentation

## 2019-12-13 DIAGNOSIS — Z20822 Contact with and (suspected) exposure to covid-19: Secondary | ICD-10-CM | POA: Insufficient documentation

## 2019-12-13 DIAGNOSIS — R300 Dysuria: Secondary | ICD-10-CM

## 2019-12-13 LAB — URINALYSIS, COMPLETE (UACMP) WITH MICROSCOPIC
Bacteria, UA: NONE SEEN
Bilirubin Urine: NEGATIVE
Glucose, UA: NEGATIVE mg/dL
Hgb urine dipstick: NEGATIVE
Ketones, ur: NEGATIVE mg/dL
Leukocytes,Ua: NEGATIVE
Nitrite: NEGATIVE
Protein, ur: NEGATIVE mg/dL
Specific Gravity, Urine: 1.031 — ABNORMAL HIGH (ref 1.005–1.030)
Squamous Epithelial / HPF: NONE SEEN (ref 0–5)
pH: 5 (ref 5.0–8.0)

## 2019-12-13 LAB — C DIFFICILE QUICK SCREEN W PCR REFLEX
C Diff antigen: NEGATIVE
C Diff interpretation: NOT DETECTED
C Diff toxin: NEGATIVE

## 2019-12-13 LAB — COMPREHENSIVE METABOLIC PANEL
ALT: 19 U/L (ref 0–44)
AST: 16 U/L (ref 15–41)
Albumin: 4.2 g/dL (ref 3.5–5.0)
Alkaline Phosphatase: 74 U/L (ref 38–126)
Anion gap: 10 (ref 5–15)
BUN: 15 mg/dL (ref 8–23)
CO2: 25 mmol/L (ref 22–32)
Calcium: 9 mg/dL (ref 8.9–10.3)
Chloride: 105 mmol/L (ref 98–111)
Creatinine, Ser: 1.1 mg/dL (ref 0.61–1.24)
GFR, Estimated: >60 mL/min (ref >60)
Glucose, Bld: 85 mg/dL (ref 70–99)
Potassium: 3.4 mmol/L — ABNORMAL LOW (ref 3.5–5.1)
Sodium: 140 mmol/L (ref 135–145)
Total Bilirubin: 0.8 mg/dL (ref 0.3–1.2)
Total Protein: 7.5 g/dL (ref 6.5–8.1)

## 2019-12-13 LAB — URINE DRUG SCREEN, QUALITATIVE (ARMC ONLY)
Amphetamines, Ur Screen: NOT DETECTED
Barbiturates, Ur Screen: NOT DETECTED
Benzodiazepine, Ur Scrn: NOT DETECTED
Cannabinoid 50 Ng, Ur ~~LOC~~: NOT DETECTED
Cocaine Metabolite,Ur ~~LOC~~: NOT DETECTED
MDMA (Ecstasy)Ur Screen: NOT DETECTED
Methadone Scn, Ur: NOT DETECTED
Opiate, Ur Screen: NOT DETECTED
Phencyclidine (PCP) Ur S: NOT DETECTED
Tricyclic, Ur Screen: NOT DETECTED

## 2019-12-13 LAB — ACETAMINOPHEN LEVEL: Acetaminophen (Tylenol), Serum: <10 ug/mL — ABNORMAL LOW (ref 10–30)

## 2019-12-13 LAB — GASTROINTESTINAL PANEL BY PCR, STOOL (REPLACES STOOL CULTURE)

## 2019-12-13 LAB — CBC WITH DIFFERENTIAL/PLATELET
Abs Immature Granulocytes: 0.01 10*3/uL (ref 0.00–0.07)
Basophils Absolute: 0 10*3/uL (ref 0.0–0.1)
Basophils Relative: 0 %
Eosinophils Absolute: 0.1 10*3/uL (ref 0.0–0.5)
Eosinophils Relative: 1 %
HCT: 49.5 % (ref 39.0–52.0)
Hemoglobin: 16.3 g/dL (ref 13.0–17.0)
Immature Granulocytes: 0 %
Lymphocytes Relative: 37 %
Lymphs Abs: 2.1 10*3/uL (ref 0.7–4.0)
MCH: 28.8 pg (ref 26.0–34.0)
MCHC: 32.9 g/dL (ref 30.0–36.0)
MCV: 87.6 fL (ref 80.0–100.0)
Monocytes Absolute: 0.6 10*3/uL (ref 0.1–1.0)
Monocytes Relative: 11 %
Neutro Abs: 2.8 10*3/uL (ref 1.7–7.7)
Neutrophils Relative %: 51 %
Platelets: 244 10*3/uL (ref 150–400)
RBC: 5.65 MIL/uL (ref 4.22–5.81)
RDW: 12.8 % (ref 11.5–15.5)
WBC: 5.6 10*3/uL (ref 4.0–10.5)
nRBC: 0 % (ref 0.0–0.2)

## 2019-12-13 LAB — HIV ANTIBODY (ROUTINE TESTING W REFLEX): HIV Screen 4th Generation wRfx: NONREACTIVE

## 2019-12-13 LAB — RESPIRATORY PANEL BY RT PCR (FLU A&B, COVID)
Influenza A by PCR: NEGATIVE
Influenza B by PCR: NEGATIVE
SARS Coronavirus 2 by RT PCR: NEGATIVE

## 2019-12-13 LAB — ETHANOL: Alcohol, Ethyl (B): <10 mg/dL (ref <10)

## 2019-12-13 LAB — TROPONIN I (HIGH SENSITIVITY): Troponin I (High Sensitivity): 4 ng/L (ref <18)

## 2019-12-13 LAB — CHLAMYDIA/NGC RT PCR (ARMC ONLY)
Chlamydia Tr: NOT DETECTED
N gonorrhoeae: NOT DETECTED

## 2019-12-13 LAB — SALICYLATE LEVEL: Salicylate Lvl: <7 mg/dL — ABNORMAL LOW (ref 7.0–30.0)

## 2019-12-13 LAB — LIPASE, BLOOD: Lipase: 29 U/L (ref 11–51)

## 2019-12-13 MED ORDER — LACTATED RINGERS IV BOLUS
1000.0000 mL | Freq: Once | INTRAVENOUS | Status: AC
  Administered 2019-12-13: 1000 mL via INTRAVENOUS

## 2019-12-13 MED ORDER — MIRTAZAPINE 15 MG PO TABS: 30.0000 mg | ORAL_TABLET | Freq: Every day | ORAL | Status: DC

## 2019-12-13 MED ORDER — MIRTAZAPINE 30 MG PO TABS: 30.0000 mg | ORAL_TABLET | Freq: Every day | ORAL | 1 refills | Status: DC

## 2019-12-13 MED ORDER — POTASSIUM CHLORIDE CRYS ER 20 MEQ PO TBCR
40.0000 meq | EXTENDED_RELEASE_TABLET | Freq: Once | ORAL | Status: AC
  Administered 2019-12-13: 40 meq via ORAL
  Filled 2019-12-13: qty 2

## 2019-12-13 NOTE — ED Notes (Signed)
Psych at bedside.

## 2019-12-13 NOTE — ED Notes (Signed)
Received report from [place], [place]. Pt to switch to [place] soon.

## 2019-12-13 NOTE — ED Provider Notes (Signed)
Patient seen by psychiatry. IVC rescinded. Dr. Prescott Gum will start patient on medication.    Nance Pear, MD 12/13/19 [phone]

## 2019-12-13 NOTE — ED Provider Notes (Signed)
[facility] Emergency Department Provider Note  ____________________________________________   First MD Initiated Contact with Patient 12/13/19 1113     (approximate)  I have reviewed the triage vital signs and the nursing notes.   HISTORY  Chief Complaint Diarrhea   HPI Tony Franco is a 69 y.o. male with past medical history of CVA, HDL, RLS, and depression who presents for assessment of diarrhea that began 2 nights ago.  Patient states that since then anything he has eaten or drunk is gone).  The diarrhea has been nonbloody.  He has had some crampy abdominal pain associate with this.  He also states he has had some intermittent burning with urination over the last 2 weeks.  He states he is a little sore in his chest after doing a new push-up exercise last week.  He also states he feels very fatigued.  He denies any headache, earache, sore throat, cough, shortness of breath, back pain, blood in his urine, rash, extremity pain, recent falls or injuries.  He denies EtOH use but does endorse some methamphetamine use which he smoked couple weeks ago.  He endorses severe fatigue and when asked about suicidal thoughts he states he is feeling suicidal with a possible plan to overdose.  He denies any SI or hallucinations.  He states he is currently taking trazodone for depression but is not helping.         Past Medical History:  Diagnosis Date  . Depression   . Stroke [facility])     Patient Active Problem List   Diagnosis Date Noted  . Symptoms of depression 05/30/2019  . BPH with obstruction/lower urinary tract symptoms 06/22/2017  . Mixed hyperlipidemia 06/20/2017  . Mild episode of recurrent major depressive disorder ([place]) 08/04/2016  . Right lateral epicondylitis 08/04/2016  . Screening for prostate cancer 08/04/2016  . Osteoarthritis of knees, bilateral 02/11/2016  . ED (erectile dysfunction) 04/20/2015  . Insomnia 04/20/2015  . Paresthesia 03/16/2015  .  RLS (restless legs syndrome) 03/16/2015  . Colon cancer screening 03/16/2015    Past Surgical History:  Procedure Laterality Date  . COLONOSCOPY WITH PROPOFOL N/A 04/28/2015   Procedure: COLONOSCOPY WITH PROPOFOL;  Surgeon: Christene Lye, MD;  Location: [facility] ENDOSCOPY;  Service: Endoscopy;  Laterality: N/A;  . COLONOSCOPY WITH PROPOFOL N/A 09/09/2019   Procedure: COLONOSCOPY WITH PROPOFOL;  Surgeon: Lesly Rubenstein, MD;  Location: [facility] ENDOSCOPY;  Service: Endoscopy;  Laterality: N/A;  . ESOPHAGOGASTRODUODENOSCOPY (EGD) WITH PROPOFOL N/A 09/09/2019   Procedure: ESOPHAGOGASTRODUODENOSCOPY (EGD) WITH PROPOFOL;  Surgeon: Lesly Rubenstein, MD;  Location: [facility] ENDOSCOPY;  Service: Endoscopy;  Laterality: N/A;  . KNEE ARTHROSCOPY     left knee    Prior to Admission medications   Medication Sig Start Date End Date Taking? Authorizing Provider  albuterol (VENTOLIN HFA) 108 (90 Base) MCG/ACT inhaler Inhale 2 puffs into the lungs every 4 (four) hours as needed for wheezing or shortness of breath (cough). Patient not taking: Reported on 10/17/2019 06/13/19   Olin Hauser, DO  diclofenac Sodium (VOLTAREN) 1 % GEL Apply 2 g topically 4 (four) times daily as needed (arthritis knee pain). 10/17/19   Karamalegos, Devonne Doughty, DO  fexofenadine (ALLEGRA) 180 MG tablet Take 180 mg by mouth daily.     [provider]  sildenafil (REVATIO) 20 MG tablet Take 1-5 pills about 30 min prior to sex. Start with 1 and increase as needed. Patient not taking: Reported on 10/17/2019 11/27/18   Olin Hauser,  DO  tamsulosin (FLOMAX) 0.4 MG CAPS capsule Take 1 capsule by mouth once daily 10/27/19   Parks Ranger, Devonne Doughty, DO  traZODone (DESYREL) 50 MG tablet TAKE 1 TABLET BY MOUTH AT BEDTIME 08/03/19   Karamalegos, Alexander J, DO  zolpidem (AMBIEN CR) 6.25 MG CR tablet Take 1 tablet (6.25 mg total) by mouth at bedtime as needed for sleep. 10/17/19   Olin Hauser, DO     Allergies Patient has no known allergies.  Family History  Problem Relation Age of Onset  . Heart attack Mother   . Heart attack Father     Social History Social History   Tobacco Use  . Smoking status: Never Smoker  . Smokeless tobacco: Never Used  Vaping Use  . Vaping Use: Never used  Substance Use Topics  . Alcohol use: Yes    Alcohol/week: 3.0 standard drinks    Types: 3 Glasses of wine per week  . Drug use: Yes    Types: Marijuana, Amphetamines    Comment: in past--only marijuana still use occ  Amphetamines    Review of Systems  Review of Systems  Constitutional: Positive for malaise/fatigue. Negative for chills and fever.  HENT: Negative for sore throat.   Eyes: Negative for pain.  Respiratory: Negative for cough and stridor.   Cardiovascular: Positive for chest pain ( "tightness over the last couple of days from new workout").  Gastrointestinal: Positive for abdominal pain ( "crampy") and diarrhea. Negative for vomiting.  Genitourinary: Positive for dysuria. Negative for flank pain, hematuria and urgency.  Musculoskeletal: Positive for myalgias.  Skin: Negative for rash.  Neurological: Negative for seizures, loss of consciousness and headaches.  Psychiatric/Behavioral: Positive for depression, substance abuse and suicidal ideas.  All other systems reviewed and are negative.     ____________________________________________   PHYSICAL EXAM:  VITAL SIGNS: ED Triage Vitals  Enc Vitals Group     BP 12/13/19 1112 (!) 149/75     Pulse Rate 12/13/19 1112 91     Resp 12/13/19 1112 18     Temp 12/13/19 1112 97.9 F (36.6 C)     Temp Source 12/13/19 1112 Oral     SpO2 12/13/19 1112 97 %     Weight 12/13/19 1113 176 lb (79.8 kg)     Height 12/13/19 1113 6' (1.829 m)     Head Circumference --      Peak Flow --      Pain Score 12/13/19 1113 0     Pain Loc --      Pain Edu? --      Excl. in [place]? --    Vitals:   12/13/19 1245 12/13/19 1330  BP: 125/80  134/78  Pulse: 81 78  Resp: 19 (!) 21  Temp:    SpO2: 95% 96%   Physical Exam Vitals and nursing note reviewed.  Constitutional:      Appearance: He is well-developed.  HENT:     Head: Normocephalic and atraumatic.     Right Ear: External ear normal.     Left Ear: External ear normal.     Nose: Nose normal.  Eyes:     Conjunctiva/sclera: Conjunctivae normal.  Cardiovascular:     Rate and Rhythm: Normal rate and regular rhythm.     Pulses: Normal pulses.     Heart sounds: No murmur heard.   Pulmonary:     Effort: Pulmonary effort is normal. No respiratory distress.     Breath sounds: Normal breath sounds.  Abdominal:  Palpations: Abdomen is soft.     Tenderness: There is no abdominal tenderness.  Musculoskeletal:     Cervical back: Neck supple.  Skin:    General: Skin is warm and dry.     Capillary Refill: Capillary refill takes less than 2 seconds.  Neurological:     Mental Status: He is alert and oriented to person, place, and time.  Psychiatric:        Mood and Affect: Mood normal.        Thought Content: Thought content includes suicidal ideation. Thought content does not include homicidal ideation. Thought content includes suicidal plan.      ____________________________________________   LABS (all labs ordered are listed, but only abnormal results are displayed)  Labs Reviewed  COMPREHENSIVE METABOLIC PANEL - Abnormal; Notable for the following components:      Result Value   Potassium 3.4 (*)    All other components within normal limits  URINALYSIS, COMPLETE (UACMP) WITH MICROSCOPIC - Abnormal; Notable for the following components:   Color, Urine YELLOW (*)    APPearance CLEAR (*)    Specific Gravity, Urine 1.031 (*)    All other components within normal limits  ACETAMINOPHEN LEVEL - Abnormal; Notable for the following components:   Acetaminophen (Tylenol), Serum <10 (*)    All other components within normal limits  SALICYLATE LEVEL - Abnormal;  Notable for the following components:   Salicylate Lvl <8.0 (*)    All other components within normal limits  RESPIRATORY PANEL BY RT PCR (FLU A&B, COVID)  CHLAMYDIA/NGC RT PCR ([facility] ONLY)  GASTROINTESTINAL PANEL BY PCR, STOOL (REPLACES STOOL CULTURE)  C DIFFICILE QUICK SCREEN W PCR REFLEX  CBC WITH DIFFERENTIAL/PLATELET  ETHANOL  URINE DRUG SCREEN, QUALITATIVE ([facility] ONLY)  LIPASE, BLOOD  HIV ANTIBODY (ROUTINE TESTING W REFLEX)  TROPONIN I (HIGH SENSITIVITY)  TROPONIN I (HIGH SENSITIVITY)   ____________________________________________  EKG  Sinus rhythm with a ventricular rate of 75, normal axis, unremarkable intervals, no evidence of acute ischemia or significant underlying arrhythmia. ____________________________________________   ____________________________________________   PROCEDURES  Procedure(s) performed (including Critical Care):  Procedures   ____________________________________________   INITIAL IMPRESSION / ASSESSMENT AND PLAN / ED COURSE        Patient presents with above to history exam for assessment of diarrhea was also found to be suicidal with a plan to overdose on initial interview.  Patient is hypertensive with a BP of 149/75 otherwise stable vital signs on room air.  With regard to patient's nonbloody diarrhea concern for possible infectious colitis.  Patient's abdomen is soft nontender and he states this is very similar to an episode that happened in July earlier this summer.  On review of records patient did undergo an EGD and colonoscopy that were unremarkable.  At that time patient also had stool studies that were unremarkable.  However patient does not has been traveling over the last couple days and infectious enteritis remains high within differential.  Given he has some dysuria we will also plan to assess for evidence of cystitis.    With regard to patient's chest tightness he reports he had several days ago ECG is unremarkable  given he denies any symptoms at this time a low suspicion for ACS pneumonia pneumothorax pericarditis myocarditis dissection or other acute intrathoracic process.  However I plan to send 1 troponin and if this is not elevated there is no believe this requires further emergent evaluation at this time.  With regard to severe fatigue and possible dysuria  we will send UA.  UA does not appear infected.  Will also send GC studies.  With regard to patient's suicidality with plan to overdose I did fill out IVC paperwork with concerns for patient safety.  TTS and psychiatry service were consulted.  The patient has been placed in psychiatric observation due to the need to provide a safe environment for the patient while obtaining psychiatric consultation and evaluation, as well as ongoing medical and medication management to treat the patient's condition.  The patient has been placed under full IVC at this time.  Care patient signed over to Dr. Joni Fears approximately 1520.  Plan is to follow-up troponin, GC studies, and HIV.  Patient also has C. difficile and GI pathogen panel in process although these do not necessitate staying in the emergency room hospitalization if psychiatry determines patient should be admitted these can be followed up on outpatient basis.   ____________________________________________   FINAL CLINICAL IMPRESSION(S) / ED DIAGNOSES  Final diagnoses:  Diarrhea, unspecified type  Suicidal ideations  Chest tightness  Burning with urination  Hypokalemia  Dehydration    Medications  potassium chloride SA (KLOR-CON) CR tablet 40 mEq (40 mEq Oral Given 12/13/19 1359)  lactated ringers bolus 1,000 mL (1,000 mLs Intravenous New Bag/Given 12/13/19 1358)     ED Discharge Orders    None       Note:  This document was prepared using Dragon voice recognition software and may include unintentional dictation errors.   Lucrezia Starch, MD 12/13/19 [phone]

## 2019-12-13 NOTE — Consult Note (Signed)
[facility] Face-to-Face Psychiatry Consult   Reason for Consult:   Consult for 69 year old man who is in the emergency room for GI symptoms but is endorsing symptoms of depression Referring Physician: Tamala Julian Patient Identification: Tony Franco MRN:  [id number] Principal Diagnosis: Severe recurrent major depression without psychotic features [facility]) Diagnosis:  Principal Problem:   Severe recurrent major depression without psychotic features ([place])   Total Time spent with patient: 1 hour  Subjective:   Tony Franco is a 69 y.o. male patient admitted with "I am feeling like shit".  HPI:   Patient seen chart reviewed.  69 year old man in the hospital emergency room for complaints of intractable diarrhea.  Patient endorses depressed mood.  States that mood is extremely bad stays down and negative most all of the time.  Has been bad for years but things have been particularly bad for the last 3 to 6 months.  He finds his job, which he used to enjoy, very frustrating and feels that he is not valued at work.  Several months ago he broke up with his most recent boyfriend.  That boyfriend had introduced him to methamphetamine and while the patient is not using it daily he admits to maybe once a week smoking at this point.  His sleep is bad daily.  Appetite has been poor and he has lost weight.  Denies hallucinations or psychotic symptoms.  Admits that he has thoughts of killing himself no specific plan.  Says that he does not do it in large part because his affairs in his life or not in order and it sounds like he still has some plans to try and improve things financially in his life.  Multiple major stresses that he can discuss including a betrayal by his stepson.  Patient does not feel like he has much in the way of emotional support or interests currently outside of his work.  He is not seeing anyone for outpatient treatment.  Takes a small dose of trazodone as his only psychiatric medicine.  Past Psychiatric  History: Patient says he did have one psychiatric hospitalization years ago.  Denies any past suicide attempts.  Does not remember ever being on any medicine for depression other than the current trazodone.  Denies mania.  Has been abusing methamphetamine recently but is not using it IV anymore only smoking it.  Denies other drug use.  Risk to Self: Suicidal Ideation: Yes-Currently Present Suicidal Intent: No Is patient at risk for suicide?: Yes Suicidal Plan?: Yes-Currently Present Specify Current Suicidal Plan: Overdose on medications Access to Means: Yes Specify Access to Suicidal Means: Medications What has been your use of drugs/alcohol within the last 12 months?: Methamphetamine & Alcohol How many times?: 1 Other Self Harm Risks: Active substance use Triggers for Past Attempts: Spouse contact Intentional Self Injurious Behavior: None Risk to Others: Homicidal Ideation: No Thoughts of Harm to Others: No Current Homicidal Intent: No Current Homicidal Plan: No Access to Homicidal Means: No Identified Victim: Reports of none History of harm to others?: No Assessment of Violence: None Noted Violent Behavior Description: Reports of none Does patient have access to weapons?: No Criminal Charges Pending?: No Does patient have a court date: No Prior Inpatient Therapy: Prior Inpatient Therapy: Yes Prior Therapy Dates: 1996 Prior Therapy Facilty/Provider(s): Hospital was in [place] Reason for Treatment: Suicide attempt Prior Outpatient Therapy: Prior Outpatient Therapy: No Does patient have an ACCT team?: No Does patient have Intensive In-House Services?  : No Does patient have [facility] services? :  No Does patient have P4CC services?: No  Past Medical History:  Past Medical History:  Diagnosis Date  . Depression   . Stroke [facility])     Past Surgical History:  Procedure Laterality Date  . COLONOSCOPY WITH PROPOFOL N/A 04/28/2015   Procedure: COLONOSCOPY WITH PROPOFOL;  Surgeon:  Christene Lye, MD;  Location: [facility] ENDOSCOPY;  Service: Endoscopy;  Laterality: N/A;  . COLONOSCOPY WITH PROPOFOL N/A 09/09/2019   Procedure: COLONOSCOPY WITH PROPOFOL;  Surgeon: Lesly Rubenstein, MD;  Location: [facility] ENDOSCOPY;  Service: Endoscopy;  Laterality: N/A;  . ESOPHAGOGASTRODUODENOSCOPY (EGD) WITH PROPOFOL N/A 09/09/2019   Procedure: ESOPHAGOGASTRODUODENOSCOPY (EGD) WITH PROPOFOL;  Surgeon: Lesly Rubenstein, MD;  Location: [facility] ENDOSCOPY;  Service: Endoscopy;  Laterality: N/A;  . KNEE ARTHROSCOPY     left knee   Family History:  Family History  Problem Relation Age of Onset  . Heart attack Mother   . Heart attack Father    Family Psychiatric  History: Positive for depression and alcohol abuse Social History:  Social History   Substance and Sexual Activity  Alcohol Use Yes  . Alcohol/week: 3.0 standard drinks  . Types: 3 Glasses of wine per week     Social History   Substance and Sexual Activity  Drug Use Yes  . Types: Marijuana, Amphetamines   Comment: in past--only marijuana still use occ  Amphetamines    Social History   Socioeconomic History  . Marital status: Single    Spouse name: No  . Number of children: 0  . Years of education: 79  . Highest education level: Master's degree (e.g., MA, MS, MEng, MEd, MSW, MBA)  Occupational History  . Not on file  Tobacco Use  . Smoking status: Never Smoker  . Smokeless tobacco: Never Used  Vaping Use  . Vaping Use: Never used  Substance and Sexual Activity  . Alcohol use: Yes    Alcohol/week: 3.0 standard drinks    Types: 3 Glasses of wine per week  . Drug use: Yes    Types: Marijuana, Amphetamines    Comment: in past--only marijuana still use occ  Amphetamines  . Sexual activity: Yes    Partners: Male  Other Topics Concern  . Not on file  Social History Narrative   Patient identifies as a gay man. He currently has a "housemate" who he has dated and would like to be in a relationship with. He works  part-time and expresses that he enjoys his work. He also reports that he likes to take care of others and enjoys renovating homes.     Social Determinants of Health   Financial Resource Strain:   . Difficulty of Paying Living Expenses: Not on file  Food Insecurity:   . Worried About Charity fundraiser in the Last Year: Not on file  . Ran Out of Food in the Last Year: Not on file  Transportation Needs:   . Lack of Transportation (Medical): Not on file  . Lack of Transportation (Non-Medical): Not on file  Physical Activity: Insufficiently Active  . Days of Exercise per Week: 4 days  . Minutes of Exercise per Session: 20 min  Stress:   . Feeling of Stress : Not on file  Social Connections:   . Frequency of Communication with Friends and Family: Not on file  . Frequency of Social Gatherings with Friends and Family: Not on file  . Attends Religious Services: Not on file  . Active Member of Clubs or Organizations: Not on file  .  Attends Archivist Meetings: Not on file  . Marital Status: Not on file   Additional Social History:    Allergies:  No Known Allergies  Labs:  Results for orders placed or performed during the hospital encounter of 12/13/19 (from the past 48 hour(s))  Respiratory Panel by RT PCR (Flu A&B, Covid) - Nasopharyngeal Swab     Status: None   Collection Time: 12/13/19 11:27 AM   Specimen: Nasopharyngeal Swab  Result Value Ref Range   SARS Coronavirus 2 by RT PCR NEGATIVE NEGATIVE    Comment: (NOTE) SARS-CoV-2 target nucleic acids are NOT DETECTED.  The SARS-CoV-2 RNA is generally detectable in upper respiratoy specimens during the acute phase of infection. The lowest concentration of SARS-CoV-2 viral copies this assay can detect is 131 copies/mL. A negative result does not preclude SARS-Cov-2 infection and should not be used as the sole basis for treatment or other patient management decisions. A negative result may occur with  improper specimen  collection/handling, submission of specimen other than nasopharyngeal swab, presence of viral mutation(s) within the areas targeted by this assay, and inadequate number of viral copies (<131 copies/mL). A negative result must be combined with clinical observations, patient history, and epidemiological information. The expected result is Negative.  Fact Sheet for Patients:  PinkCheek.be  Fact Sheet for Healthcare Providers:  GravelBags.it  This test is no t yet approved or cleared by the [place] FDA and  has been authorized for detection and/or diagnosis of SARS-CoV-2 by FDA under an Emergency Use Authorization (EUA). This EUA will remain  in effect (meaning this test can be used) for the duration of the COVID-19 declaration under Section 564(b)(1) of the Act, 21 U.S.C. section 360bbb-3(b)(1), unless the authorization is terminated or revoked sooner.     Influenza A by PCR NEGATIVE NEGATIVE   Influenza B by PCR NEGATIVE NEGATIVE    Comment: (NOTE) The Xpert Xpress SARS-CoV-2/FLU/RSV assay is intended as an aid in  the diagnosis of influenza from Nasopharyngeal swab specimens and  should not be used as a sole basis for treatment. Nasal washings and  aspirates are unacceptable for Xpert Xpress SARS-CoV-2/FLU/RSV  testing.  Fact Sheet for Patients: PinkCheek.be  Fact Sheet for Healthcare Providers: GravelBags.it  This test is not yet approved or cleared by the [place] FDA and  has been authorized for detection and/or diagnosis of SARS-CoV-2 by  FDA under an Emergency Use Authorization (EUA). This EUA will remain  in effect (meaning this test can be used) for the duration of the  Covid-19 declaration under Section 564(b)(1) of the Act, 21  U.S.C. section 360bbb-3(b)(1), unless the authorization is  terminated or revoked. Performed at [facility], [address] [place], [place] [address]   CBC with Differential     Status: None   Collection Time: 12/13/19 11:36 AM  Result Value Ref Range   WBC 5.6 4.0 - 10.5 K/uL   RBC 5.65 4.22 - 5.81 MIL/uL   Hemoglobin 16.3 13.0 - 17.0 g/dL   HCT 49.5 39 - 52 %   MCV 87.6 80.0 - 100.0 fL   MCH 28.8 26.0 - 34.0 pg   MCHC 32.9 30.0 - 36.0 g/dL   RDW 12.8 11.5 - 15.5 %   Platelets 244 150 - 400 K/uL   nRBC 0.0 0.0 - 0.2 %   Neutrophils Relative % 51 %   Neutro Abs 2.8 1.7 - 7.7 K/uL   Lymphocytes Relative 37 %   Lymphs Abs  2.1 0.7 - 4.0 K/uL   Monocytes Relative 11 %   Monocytes Absolute 0.6 0.1 - 1.0 K/uL   Eosinophils Relative 1 %   Eosinophils Absolute 0.1 0.0 - 0.5 K/uL   Basophils Relative 0 %   Basophils Absolute 0.0 0.0 - 0.1 K/uL   Immature Granulocytes 0 %   Abs Immature Granulocytes 0.01 0.00 - 0.07 K/uL    Comment: Performed at [facility], [address] [place], [place] [address]  Comprehensive metabolic panel     Status: Abnormal   Collection Time: 12/13/19 11:36 AM  Result Value Ref Range   Sodium 140 135 - 145 mmol/L   Potassium 3.4 (L) 3.5 - 5.1 mmol/L   Chloride 105 98 - 111 mmol/L   CO2 25 22 - 32 mmol/L   Glucose, Bld 85 70 - 99 mg/dL    Comment: Glucose reference range applies only to samples taken after fasting for at least 8 hours.   BUN 15 8 - 23 mg/dL   Creatinine, Ser 1.10 0.61 - 1.24 mg/dL   Calcium 9.0 8.9 - 10.3 mg/dL   Total Protein 7.5 6.5 - 8.1 g/dL   Albumin 4.2 3.5 - 5.0 g/dL   AST 16 15 - 41 U/L   ALT 19 0 - 44 U/L   Alkaline Phosphatase 74 38 - 126 U/L   Total Bilirubin 0.8 0.3 - 1.2 mg/dL   GFR, Estimated >60 >60 mL/min    Comment: (NOTE) Calculated using the CKD-EPI Creatinine Equation (2021)    Anion gap 10 5 - 15    Comment: Performed at [facility], [address] [place], [place] [address]  Ethanol     Status: None   Collection Time: 12/13/19 11:36 AM  Result Value Ref Range   Alcohol, Ethyl (B)  <10 <10 mg/dL    Comment: (NOTE) Lowest detectable limit for serum alcohol is 10 mg/dL.  For medical purposes only. Performed at [facility], [address] [place], [place] [address]   Lipase, blood     Status: None   Collection Time: 12/13/19 11:36 AM  Result Value Ref Range   Lipase 29 11 - 51 U/L    Comment: Performed at [facility], [place], [place] [address]  Troponin I (High Sensitivity)     Status: None   Collection Time: 12/13/19 11:36 AM  Result Value Ref Range   Troponin I (High Sensitivity) 4 <18 ng/L    Comment: (NOTE) Elevated high sensitivity troponin I (hsTnI) values and significant  changes across serial measurements may suggest ACS but many other  chronic and acute conditions are known to elevate hsTnI results.  Refer to the "Links" section for chest pain algorithms and additional  guidance. Performed at [facility], [address] [place], [place] [address]   Acetaminophen level     Status: Abnormal   Collection Time: 12/13/19 11:36 AM  Result Value Ref Range   Acetaminophen (Tylenol), Serum <10 (L) 10 - 30 ug/mL    Comment: (NOTE) Therapeutic concentrations vary significantly. A range of 10-30 ug/mL  may be an effective concentration for many patients. However, some  are best treated at concentrations outside of this range. Acetaminophen concentrations >150 ug/mL at 4 hours after ingestion  and >50 ug/mL at 12 hours after ingestion are often associated with  toxic reactions.  Performed at [facility], [address] [place],  [address]   Salicylate level     Status:  Abnormal   Collection Time: 12/13/19 11:36 AM  Result Value Ref Range   Salicylate Lvl <1.4 (L) 7.0 - 30.0 mg/dL    Comment: Performed at [facility], [address] [place], [place] [address]  Urine Drug Screen, Qualitative     Status: None   Collection Time: 12/13/19 12:56 PM  Result Value Ref Range    Tricyclic, Ur Screen NONE DETECTED NONE DETECTED   Amphetamines, Ur Screen NONE DETECTED NONE DETECTED   MDMA (Ecstasy)Ur Screen NONE DETECTED NONE DETECTED   Cocaine Metabolite,Ur [place] NONE DETECTED NONE DETECTED   Opiate, Ur Screen NONE DETECTED NONE DETECTED   Phencyclidine (PCP) Ur S NONE DETECTED NONE DETECTED   Cannabinoid 50 Ng, Ur [place] NONE DETECTED NONE DETECTED   Barbiturates, Ur Screen NONE DETECTED NONE DETECTED   Benzodiazepine, Ur Scrn NONE DETECTED NONE DETECTED   Methadone Scn, Ur NONE DETECTED NONE DETECTED    Comment: (NOTE) Tricyclics + metabolites, urine    Cutoff 1000 ng/mL Amphetamines + metabolites, urine  Cutoff 1000 ng/mL MDMA (Ecstasy), urine              Cutoff 500 ng/mL Cocaine Metabolite, urine          Cutoff 300 ng/mL Opiate + metabolites, urine        Cutoff 300 ng/mL Phencyclidine (PCP), urine         Cutoff 25 ng/mL Cannabinoid, urine                 Cutoff 50 ng/mL Barbiturates + metabolites, urine  Cutoff 200 ng/mL Benzodiazepine, urine              Cutoff 200 ng/mL Methadone, urine                   Cutoff 300 ng/mL  The urine drug screen provides only a preliminary, unconfirmed analytical test result and should not be used for non-medical purposes. Clinical consideration and professional judgment should be applied to any positive drug screen result due to possible interfering substances. A more specific alternate chemical method must be used in order to obtain a confirmed analytical result. Gas chromatography / mass spectrometry (GC/MS) is the preferred confirm atory method. Performed at [facility], [address] [place], [place] [address]   Urinalysis, Complete w Microscopic     Status: Abnormal   Collection Time: 12/13/19 12:56 PM  Result Value Ref Range   Color, Urine YELLOW (A) YELLOW   APPearance CLEAR (A) CLEAR   Specific Gravity, Urine 1.031 (H) 1.005 - 1.030   pH 5.0 5.0 - 8.0   Glucose, UA NEGATIVE NEGATIVE mg/dL   Hgb  urine dipstick NEGATIVE NEGATIVE   Bilirubin Urine NEGATIVE NEGATIVE   Ketones, ur NEGATIVE NEGATIVE mg/dL   Protein, ur NEGATIVE NEGATIVE mg/dL   Nitrite NEGATIVE NEGATIVE   Leukocytes,Ua NEGATIVE NEGATIVE   RBC / HPF 0-5 0 - 5 RBC/hpf   WBC, UA 0-5 0 - 5 WBC/hpf   Bacteria, UA NONE SEEN NONE SEEN   Squamous Epithelial / LPF NONE SEEN 0 - 5   Mucus PRESENT    Ca Oxalate Crys, UA PRESENT     Comment: Performed at [facility], [address] [place], [place] [address]  [place] rt PCR [facility] only)     Status: None   Collection Time: 12/13/19 12:56 PM  Result Value Ref Range   Specimen source GC/Chlam URINE, RANDOM    Chlamydia Tr NOT DETECTED NOT DETECTED  N gonorrhoeae NOT DETECTED NOT DETECTED    Comment: (NOTE) This CT/NG assay has not been evaluated in patients with a history of  hysterectomy. Performed at [facility], [address] [place], [place] [address]     Current Facility-Administered Medications  Medication Dose Route Frequency Provider Last Rate Last Admin  . mirtazapine (REMERON) tablet 30 mg  30 mg Oral QHS Hieu Herms, Madie Reno, MD       Current Outpatient Medications  Medication Sig Dispense Refill  . albuterol (VENTOLIN HFA) 108 (90 Base) MCG/ACT inhaler Inhale 2 puffs into the lungs every 4 (four) hours as needed for wheezing or shortness of breath (cough). (Patient not taking: Reported on 10/17/2019) 6.7 g 1  . diclofenac Sodium (VOLTAREN) 1 % GEL Apply 2 g topically 4 (four) times daily as needed (arthritis knee pain). 100 g 2  . fexofenadine (ALLEGRA) 180 MG tablet Take 180 mg by mouth daily.     . sildenafil (REVATIO) 20 MG tablet Take 1-5 pills about 30 min prior to sex. Start with 1 and increase as needed. (Patient not taking: Reported on 10/17/2019) 30 tablet 2  . tamsulosin (FLOMAX) 0.4 MG CAPS capsule Take 1 capsule by mouth once daily 90 capsule 3  . traZODone (DESYREL) 50 MG tablet TAKE 1 TABLET BY MOUTH AT BEDTIME 90 tablet 1   . zolpidem (AMBIEN CR) 6.25 MG CR tablet Take 1 tablet (6.25 mg total) by mouth at bedtime as needed for sleep. 30 tablet 2    Musculoskeletal: Strength & Muscle Tone: within normal limits Gait & Station: normal Patient leans: N/A  Psychiatric Specialty Exam: Physical Exam Vitals and nursing note reviewed.  Constitutional:      Appearance: He is well-developed.  HENT:     Head: Normocephalic and atraumatic.  Eyes:     Conjunctiva/sclera: Conjunctivae normal.     Pupils: Pupils are equal, round, and reactive to light.  Cardiovascular:     Heart sounds: Normal heart sounds.  Pulmonary:     Effort: Pulmonary effort is normal.  Abdominal:     Palpations: Abdomen is soft.  Musculoskeletal:        General: Normal range of motion.     Cervical back: Normal range of motion.  Skin:    General: Skin is warm and dry.  Neurological:     General: No focal deficit present.     Mental Status: He is alert.  Psychiatric:        Attention and Perception: Attention normal.        Mood and Affect: Mood is depressed.        Speech: Speech normal.        Behavior: Behavior normal.        Thought Content: Thought content includes suicidal ideation. Thought content does not include suicidal plan.        Cognition and Memory: Cognition normal.        Judgment: Judgment normal.     Review of Systems  Constitutional: Negative.   HENT: Negative.   Eyes: Negative.   Respiratory: Negative.   Cardiovascular: Negative.   Gastrointestinal: Positive for diarrhea.  Musculoskeletal: Negative.   Skin: Negative.   Neurological: Negative.   Psychiatric/Behavioral: Positive for dysphoric mood, sleep disturbance and suicidal ideas. Negative for hallucinations and self-injury. The patient is nervous/anxious. The patient is not hyperactive.     Blood pressure 134/78, pulse 78, temperature 97.9 F (36.6 C), temperature source Oral, resp. rate (!) 21, height 6' (1.829 m),  weight 79.8 kg, SpO2 96 %.Body  mass index is 23.87 kg/m.  General Appearance: Casual  Eye Contact:  Fair  Speech:  Normal Rate  Volume:  Normal  Mood:  Depressed  Affect:  Full Range  Thought Process:  Coherent  Orientation:  Full (Time, Place, and Person)  Thought Content:  Logical  Suicidal Thoughts:  Yes.  without intent/plan  Homicidal Thoughts:  No  Memory:  Immediate;   Fair Recent;   Fair Remote;   Fair  Judgement:  Fair  Insight:  Fair  Psychomotor Activity:  Normal  Concentration:  Concentration: Fair  Recall:  [employer] of Knowledge:  Fair  Language:  Fair  Akathisia:  No  Handed:  Right  AIMS (if indicated):     Assets:  Communication Skills Desire for Improvement Financial Resources/Insurance Housing Resilience  ADL's:  Intact  Cognition:  WNL  Sleep:        Treatment Plan Summary: Medication management and Plan 69 year old man with major depression.  He does have multiple risk factors for suicide including gender, age, isolation, medical problems.  Patient and I discussed the option of inpatient hospitalization.  He stated that he did not think he had any plans or intention of harming himself at this point.  He also showed insight into the need for better treatment.  We reviewed outpatient treatment that is available therapy and medication management.  Patient was open to my suggestion of starting a better antidepressant than the tiny dose of trazodone.  Prescription will be written for mirtazapine 30 mg at night and provided on the chart.  Patient strongly encouraged to follow-up not only with his primary care doctor but with psychiatrist in the area.  Spent time with the patient supportive therapy completed and psychoeducation.  Discontinued the IVC as the patient is agreeable to appropriate treatment  Disposition: Supportive therapy provided about ongoing stressors. Discussed crisis plan, support from social network, calling 911, coming to the Emergency Department, and calling Suicide  Hotline.  Alethia Berthold, MD 12/13/2019 3:45 PM
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
## 2019-12-13 NOTE — ED Notes (Signed)
Sitter at bedside.

## 2019-12-13 NOTE — ED Notes (Signed)
Patient unable to give fecal sample at this time. Will reattempt when able.

## 2019-12-13 NOTE — ED Provider Notes (Signed)
I did call the pt to inform him of his positive Giardia stool test. I also called an Rx into his pharamcy for flagyl. Explained to pt expected clinical course and to avoid etoh while on flagyl.    Lucrezia Starch, MD 12/13/19 [phone]

## 2019-12-13 NOTE — Telephone Encounter (Signed)
Patient called after being in the ER with watery diarrhea. He states his problem started Wednesday at 12am in the morning and continues. He states Just a sip of water causes him to have watery yellow smelly BM . He denies any stomach cramping. He states in the er they gave him IV fluids and 2 potassium pills and sent him home to take imodium. He sates all abs were normal. He states that he had this same problem 3 months ago  He says his symptoms lasted 5 weeks then went away. Per protocol he was told to seek medical care again if imodium did not stop his diarrhea. He is aware of dehydration symptoms. He agree to return to ER if symptoms persist. He is requesting a referral to GI. Care advice read to patient. He verbalized understanding.  Reason for Disposition . [1] SEVERE diarrhea (e.g., 7 or more times / day more than normal) AND [2] present > 24 hours (1 day)  Answer Assessment - Initial Assessment Questions 1. DIARRHEA SEVERITY: "How bad is the diarrhea?" "How many extra stools have you had in the past 24 hours than normal?"    - NO DIARRHEA (SCALE 0)   - MILD (SCALE 1-3): Few loose or mushy BMs; increase of 1-3 stools over normal daily number of stools; mild increase in ostomy output.   -  MODERATE (SCALE 4-7): Increase of 4-6 stools daily over normal; moderate increase in ostomy output. * SEVERE (SCALE 8-10; OR 'WORST POSSIBLE'): Increase of 7 or more stools daily over normal; moderate increase in ostomy output; incontinence.     severe 2. ONSET: "When did the diarrhea begin?"      wednesday 3. BM CONSISTENCY: "How loose or watery is the diarrhea?"     Water ligh yellow 4. VOMITING: "Are you also vomiting?" If Yes, ask: "How many times in the past 24 hours?"     No 5. ABDOMINAL PAIN: "Are you having any abdominal pain?" If Yes, ask: "What does it feel like?" (e.g., crampy, dull, intermittent, constant)      Slight cramping 6. ABDOMINAL PAIN SEVERITY: If present, ask: "How bad is the pain?"   (e.g., Scale 1-10; mild, moderate, or severe)   - MILD (1-3): doesn't interfere with normal activities, abdomen soft and not tender to touch    - MODERATE (4-7): interferes with normal activities or awakens from sleep, tender to touch    - SEVERE (8-10): excruciating pain, doubled over, unable to do any normal activities      none 7. ORAL INTAKE: If vomiting, "Have you been able to drink liquids?" "How much fluids have you had in the past 24 hours?"     N/A 8. HYDRATION: "Any signs of dehydration?" (e.g., dry mouth [not just dry lips], too weak to stand, dizziness, new weight loss) "When did you last urinate?"     intravenus solution at ER today 9. EXPOSURE: "Have you traveled to a foreign country recently?" "Have you been exposed to anyone with diarrhea?" "Could you have eaten any food that was spoiled?"     no 10. ANTIBIOTIC USE: "Are you taking antibiotics now or have you taken antibiotics in the past 2 months?"       no 11. OTHER SYMPTOMS: "Do you have any other symptoms?" (e.g., fever, blood in stool)      No 12. PREGNANCY: "Is there any chance you are pregnant?" "When was your last menstrual period?"       N/A  Protocols used: DIARRHEA-A-AH

## 2019-12-13 NOTE — ED Notes (Signed)
IVC 

## 2019-12-13 NOTE — ED Triage Notes (Signed)
Consistent diarrhea within 30 minutes of anything PO since Tuesday. Denies emesis. Pt alert and oriented X4, cooperative, RR even and unlabored, color WNL. Pt in NAD.

## 2019-12-13 NOTE — ED Notes (Signed)
Pt was dressed out but Probation officer and [employer].   Pt belongings: Meds, brown jacket, black pull-over, t-shirt, black shoes, 2 black wallets, six cards, id, and s.s. card, socks, belt, cell phone, cell phone charger in pocket of brown jacket,  $48 cash in black wallet with chain that was counted by RN and Probation officer in front of pt.

## 2019-12-13 NOTE — ED Notes (Addendum)
Pt refused to give up phone since being moved into [place] room 22. This Designer, multimedia reviewed provider Clapacs' note. Pt temporarily allowed to keep his phone and placed in [place] instead of room 22 per Charge RN. Charge RN states will discuss further with EDP Tamala Julian and provider Clapacs soon.

## 2019-12-13 NOTE — Discharge Instructions (Addendum)
Please seek medical attention and help for any thoughts about wanting to harm yourself, harm others, any concerning change in behavior, severe depression, inappropriate drug use or any other new or concerning symptoms. ° °

## 2019-12-13 NOTE — Telephone Encounter (Signed)
Patient is calling to report he is having watery stool since Wednesday. Whatever he drinks or eats goes start through. Patient did try Imodium without any relief. Advised ED for evaluation of symptoms.  Reason for Disposition . [1] Drinking very little AND [2] dehydration suspected (e.g., no urine > 12 hours, very dry mouth, very lightheaded)  Answer Assessment - Initial Assessment Questions 1. DIARRHEA SEVERITY: "How bad is the diarrhea?" "How many extra stools have you had in the past 24 hours than normal?"    - NO DIARRHEA (SCALE 0)   - MILD (SCALE 1-3): Few loose or mushy BMs; increase of 1-3 stools over normal daily number of stools; mild increase in ostomy output.   -  MODERATE (SCALE 4-7): Increase of 4-6 stools daily over normal; moderate increase in ostomy output. * SEVERE (SCALE 8-10; OR 'WORST POSSIBLE'): Increase of 7 or more stools daily over normal; moderate increase in ostomy output; incontinence.     Severe-8 2. ONSET: "When did the diarrhea begin?"      Started Wednesday early morning 3. BM CONSISTENCY: "How loose or watery is the diarrhea?"      watery 4. VOMITING: "Are you also vomiting?" If Yes, ask: "How many times in the past 24 hours?"      no 5. ABDOMINAL PAIN: "Are you having any abdominal pain?" If Yes, ask: "What does it feel like?" (e.g., crampy, dull, intermittent, constant)      No pain 6. ABDOMINAL PAIN SEVERITY: If present, ask: "How bad is the pain?"  (e.g., Scale 1-10; mild, moderate, or severe)   - MILD (1-3): doesn't interfere with normal activities, abdomen soft and not tender to touch    - MODERATE (4-7): interferes with normal activities or awakens from sleep, tender to touch    - SEVERE (8-10): excruciating pain, doubled over, unable to do any normal activities       no 7. ORAL INTAKE: If vomiting, "Have you been able to drink liquids?" "How much fluids have you had in the past 24 hours?"     n/a 8. HYDRATION: "Any signs of dehydration?" (e.g., dry  mouth [not just dry lips], too weak to stand, dizziness, new weight loss) "When did you last urinate?"     Dry mouth, weakness 9. EXPOSURE: "Have you traveled to a foreign country recently?" "Have you been exposed to anyone with diarrhea?" "Could you have eaten any food that was spoiled?"     Both patient and associate got sick- different symptoms 10. ANTIBIOTIC USE: "Are you taking antibiotics now or have you taken antibiotics in the past 2 months?"       no 11. OTHER SYMPTOMS: "Do you have any other symptoms?" (e.g., fever, blood in stool)       no 12. PREGNANCY: "Is there any chance you are pregnant?" "When was your last menstrual period?"       n/a  Protocols used: DIARRHEA-A-AH

## 2019-12-13 NOTE — ED Notes (Addendum)
Pt given his belongings bags. Continues to deny intentions or plan to harm self.

## 2019-12-16 NOTE — Telephone Encounter (Signed)
  Patient was diagnosed with Giardia infection that can cause this type of diarrhea.  There are a few different anti-parasite treatment regimens including Metronidazole (Flagyl) antibiotic medicine.  Please contact patient to find out if he took an antibiotic for this already from the Hospital ED after being discharged?  Also - he already has a GI doctor - at [facility], they last saw him in July 2021.  Is he asking to switch to a new GI doctor - [facility]? Or if he wants to return to [facility] he does not need a new referral.  [name], DO [facility] 12/16/2019, 9:04 AM

## 2019-12-16 NOTE — Telephone Encounter (Signed)
As per patient got his antibiotics Saturday Morning after this message which made tremendous difference no more diarrhea and had normal BM today does not require to see GI now but will keep same GI specialist for future and if decide to change then will call.

## 2020-01-29 ENCOUNTER — Ambulatory Visit: Payer: Self-pay | Admitting: *Deleted

## 2020-01-29 NOTE — Telephone Encounter (Signed)
Pt called in told the agent he was short of breath so she transferred him to me for triage.  His issue is not shortness of breath but extreme fatigue.   "I'm just worn out all the time".   "[place] and [place]'m give out".  This has been going on for the last several weeks. He does have a cough and a post nasal drip of yellow mucus "but I have that all the time it's nothing new".  I feel like I have bronchitis.  So I confirmed with him that the fatigue is the main concern and he said, Yes".     After completing the covid questionnaire it indicates a video visit however he prefers to come into the office.   He did a home covid test last night and it was negative.  I sent a request into [facility]  For Dr. Parks Ranger to see if he could do an in office visit.    I let pt know someone would call him back.  [phone] is the correct number.  He prefers an appt next Thursday or Friday of next week if possible.  No protocol used since this was not a covid related issue after all.  Reason for Disposition . HIGH RISK for severe COVID complications (e.g., age > 52 years, obesity with BMI > 25, pregnant, chronic lung disease or other chronic medical condition)  (Exception: Already seen by PCP and no new or worsening symptoms.)    He has been very sick on and off over the last several months.  "My age and frequent illnesses make me high risk".  Answer Assessment - Initial Assessment Questions 1. COVID-19 DIAGNOSIS: "Who made your Coronavirus (COVID-19) diagnosis?" "Was it confirmed by a positive lab test?" If not diagnosed by a HCP, ask "Are there lots of cases (community spread) where you live?" (See public health department website, if unsure)     I have constant fatigue over the last few months.   I've been out sick a lot lately with a parasite in my gut. I get worn out quickly and now I have a cold.   I was tested last night with a home test that is normal. A month or  so ago I was in ED really sick.  They started me on an antidepressant.   I've just been really sick over the last several months. I've had my 2 covid vaccines.   2. COVID-19 EXPOSURE: "Was there any known exposure to COVID before the symptoms began?" CDC Definition of close contact: within 6 feet (2 meters) for a total of 15 minutes or more over a 24-hour period.      Not that I know of. 3. ONSET: "When did the COVID-19 symptoms start?"      *No Answer* 4. WORST SYMPTOM: "What is your worst symptom?" (e.g., cough, fever, shortness of breath, muscle aches)     *No Answer* 5. COUGH: "Do you have a cough?" If Yes, ask: "How bad is the cough?"       *No Answer* 6. FEVER: "Do you have a fever?" If Yes, ask: "What is your temperature, how was it measured, and when did it start?"     *No Answer* 7. RESPIRATORY STATUS: "Describe your breathing?" (e.g., shortness of breath, wheezing, unable to speak)      *No Answer* 8. BETTER-SAME-WORSE: "Are you getting better, staying the same or getting worse compared to yesterday?"  If getting worse, ask, "In what  way?"     *No Answer* 9. HIGH RISK DISEASE: "Do you have any chronic medical problems?" (e.g., asthma, heart or lung disease, weak immune system, obesity, etc.)     *No Answer* 10. PREGNANCY: "Is there any chance you are pregnant?" "When was your last menstrual period?"       *No Answer* 11. OTHER SYMPTOMS: "Do you have any other symptoms?"  (e.g., chills, fatigue, headache, loss of smell or taste, muscle pain, sore throat; new loss of smell or taste especially support the diagnosis of COVID-19)       *No Answer*  Protocols used: CORONAVIRUS (COVID-19) DIAGNOSED OR SUSPECTED-A-AH

## 2020-01-29 NOTE — Telephone Encounter (Signed)
He likely needs Chest X-ray imaging and more prompt evaluation, I think best location would be an urgent care that could do blood work and chest x-ray and do all the testing he needs at once.  If he cannot go or prefers to not go to urgent care, we may have some opening tomorrow otherwise, due to holiday our hours are limited. I am not in office rest of week, this sounds like it should not wait, therefore Urgent care walk in clinic may be best option  [name], DO [facility] 01/29/2020, 4:43 PM

## 2020-01-29 NOTE — Telephone Encounter (Signed)
Unable to reach the patient

## 2020-01-30 NOTE — Telephone Encounter (Signed)
I called this pt to make sure he got his message from Dr. Parks Ranger.   He mentioned he is in [place] now.  "I will go to an urgent care here then".   He wanted to know if his records would be visible for Dr. Parks Ranger from [place].   I suggested he get a copy of the records to bring back with him just in case his records could not be viewed on the computer.   He was agreeable to getting copies and going on to the urgent care.

## 2020-02-13 ENCOUNTER — Other Ambulatory Visit: Payer: Self-pay | Admitting: Family Medicine

## 2020-02-13 DIAGNOSIS — F330 Major depressive disorder, recurrent, mild: Secondary | ICD-10-CM

## 2020-02-13 DIAGNOSIS — F5101 Primary insomnia: Secondary | ICD-10-CM

## 2020-02-13 MED ORDER — TRAZODONE HCL 50 MG PO TABS: 50.0000 mg | ORAL_TABLET | Freq: Every day | ORAL | 0 refills | Status: DC

## 2020-02-13 MED ORDER — MIRTAZAPINE 30 MG PO TABS: 30.0000 mg | ORAL_TABLET | Freq: Every day | ORAL | 2 refills | Status: DC

## 2020-02-13 NOTE — Telephone Encounter (Signed)
Notes to clinic:  medication filled by a different provider  Review for refill   Requested Prescriptions  Pending Prescriptions Disp Refills   mirtazapine (REMERON) 30 MG tablet 30 tablet 1    Sig: Take 1 tablet (30 mg total) by mouth at bedtime.      Psychiatry: Antidepressants - mirtazapine Failed - 02/13/2020  2:14 PM      Failed - Triglycerides in normal range and within 360 days    Triglycerides  Date Value Ref Range Status  06/19/2017 161 (H) <150 mg/dL Final          Failed - Total Cholesterol in normal range and within 360 days    Cholesterol  Date Value Ref Range Status  06/19/2017 176 <200 mg/dL Final          Passed - AST in normal range and within 360 days    AST  Date Value Ref Range Status  12/13/2019 16 15 - 41 U/L Final          Passed - ALT in normal range and within 360 days    ALT  Date Value Ref Range Status  12/13/2019 19 0 - 44 U/L Final          Passed - WBC in normal range and within 360 days    WBC  Date Value Ref Range Status  12/13/2019 5.6 4.0 - 10.5 K/uL Final          Passed - Completed PHQ-2 or PHQ-9 in the last 360 days      Passed - Valid encounter within last 6 months    Recent Outpatient Visits           3 months ago Chronic pain of left knee   [facility] [place], Netta Neat, DO   6 months ago Diarrhea, unspecified type   [facility] [place], Netta Neat, DO   8 months ago Acute non-recurrent frontal sinusitis   [facility] Smitty Cords, DO   11 months ago Mild intermittent asthma with exacerbation   [facility] [place], Netta Neat, DO   1 year ago Mild intermittent asthma with exacerbation   [facility] [place], Netta Neat, DO                 Signed Prescriptions Disp Refills   traZODone (DESYREL) 50 MG tablet 90 tablet 0    Sig: Take 1 tablet (50 mg total) by mouth at bedtime.      Psychiatry:  Antidepressants - Serotonin Modulator Passed - 02/13/2020  2:14 PM      Passed - Completed PHQ-2 or PHQ-9 in the last 360 days      Passed - Valid encounter within last 6 months    Recent Outpatient Visits           3 months ago Chronic pain of left knee   [facility] [place], Netta Neat, DO   6 months ago Diarrhea, unspecified type   [facility] Smitty Cords, DO   8 months ago Acute non-recurrent frontal sinusitis   [facility] Smitty Cords, DO   11 months ago Mild intermittent asthma with exacerbation   [facility] [place], Netta Neat, DO   1 year ago Mild intermittent asthma with exacerbation   [facility] [place], Netta Neat, DO

## 2020-02-13 NOTE — Telephone Encounter (Signed)
Spoke to the patient he prefers Mirtazapine and was not aware about follow up with psychiatry.

## 2020-02-13 NOTE — Telephone Encounter (Signed)
Apt scheduled for 02/20/2020 around 9:40 am.

## 2020-02-13 NOTE — Telephone Encounter (Signed)
Medication Refill - Medication: traZODone (DESYREL) 50 MG tablet mirtazapine (REMERON) 30 MG tablet     Preferred Pharmacy (with phone number or street name):  [id number] [place], [place] - [address] AT [facility] [address] [facility] Phone:  [phone]  Fax:  [phone]       Agent: Please be advised that RX refills may take up to 3 business days. We ask that you follow-up with your pharmacy.

## 2020-02-13 NOTE — Telephone Encounter (Signed)
Discontinued Trazodone.  Signed Mirtazapine order.  Saralyn Pilar, DO [facility] 02/13/2020, 3:34 PM

## 2020-02-13 NOTE — Telephone Encounter (Signed)
Patient was inform for Rx send but has question about follow up psych apt and referral.

## 2020-02-13 NOTE — Telephone Encounter (Signed)
Please call patient to clarify which medication he should be taking.  In 12/2019 he was seen by Psychiatry in ED.  They started him on Mirtazapine 30mg  nightly. It looks like his Trazodone was discontinued at that time but not taken off his med list.  He has not been seen for follow-up since then.  We received both refill requests for Trazodone and Mirtazapine.  The Trazodone was already automatically refilled today 02/13/20, but the Mirtazapine was held until further approval by me.  Can you check with patient which medication he prefers to take? It looks like he should have stopped Trazodone and started Mirtazapine.  Also, he was advised to follow-up with Psychiatry and I do not see any appointment.  04/12/20, DO [facility] 02/13/2020, 3:08 PM

## 2020-02-20 ENCOUNTER — Other Ambulatory Visit: Payer: Self-pay

## 2020-02-20 ENCOUNTER — Ambulatory Visit (INDEPENDENT_AMBULATORY_CARE_PROVIDER_SITE_OTHER): Payer: Medicare Other | Admitting: Family Medicine

## 2020-02-20 ENCOUNTER — Encounter: Payer: Self-pay | Admitting: Family Medicine

## 2020-02-20 VITALS — Wt 176.0 lb

## 2020-02-20 DIAGNOSIS — K602 Anal fissure, unspecified: Secondary | ICD-10-CM

## 2020-02-20 DIAGNOSIS — W57XXXA Bitten or stung by nonvenomous insect and other nonvenomous arthropods, initial encounter: Secondary | ICD-10-CM

## 2020-02-20 DIAGNOSIS — F5101 Primary insomnia: Secondary | ICD-10-CM

## 2020-02-20 DIAGNOSIS — F332 Major depressive disorder, recurrent severe without psychotic features: Secondary | ICD-10-CM

## 2020-02-20 DIAGNOSIS — Z23 Encounter for immunization: Secondary | ICD-10-CM | POA: Diagnosis not present

## 2020-02-20 MED ORDER — TRIAMCINOLONE ACETONIDE 0.5 % EX CREA: 1.0000 "application " | TOPICAL_CREAM | Freq: Two times a day (BID) | CUTANEOUS | 1 refills | Status: DC

## 2020-02-20 MED ORDER — MUPIROCIN CALCIUM 2 % EX CREA: 1.0000 "application " | TOPICAL_CREAM | Freq: Two times a day (BID) | CUTANEOUS | 1 refills | Status: DC | PRN

## 2020-02-20 MED ORDER — DILTIAZEM GEL 2 %: 1.0000 "application " | Freq: Three times a day (TID) | CUTANEOUS | 2 refills | Status: DC

## 2020-02-20 NOTE — Assessment & Plan Note (Signed)
Chronic severe mood disorder, major depression Secondary Insomnia See last acute ED visit 12/2019 with Psychiatry evaluation Dr Weber Cooks Patient has history of suicidal ideation in past. He was determined to not meet inpatient criteria at that time and deemed appropriate for outpatient psych follow-up. This was not completed.  Patient continues on Mirtazapine 30mg  nightly and doing very well with mood now, I have offered to refill this and recommended referral back to psychiatry outpatient for further longer term management  Will place referral to Psych

## 2020-02-20 NOTE — Patient Instructions (Addendum)
Thank you for coming to the office today.  PSYCHIATRY (AND THERAPY-COUSEN[place]ING)  [facility] Address: [address] [place], [place] [address] [email or web address] Phone: [phone]  [facility] (All ages) [address] [place], [id number] Phone: [phone] (Option 1) [email or web address]   Please schedule a Follow-up Appointment to: Return in about 3 months (around 05/20/2020) for 3 month follow-up mental health mood/insomnia, blood work.  If you have any other questions or concerns, please feel free to call the office or send a message through [place]. You may also schedule an earlier appointment if necessary.  Additionally, you may be receiving a survey about your experience at our office within a few days to 1 week by e-mail or mail. We value your feedback.  [name], DO [place]

## 2020-02-20 NOTE — Assessment & Plan Note (Addendum)
Chronic depression but Mood is stable now and improved after recent acute flare, ED visit previously 12/2019 Chronic insomnia, primary issue for years In past multiple medications tried including Trazodone, mixed results. Has been on variety of SSRI, SNRI as well Now improved on Mirtazapine with significant improvement in mood, but limited sleep benefit - seems to worsen his insomnia. Improved on Ambien CR 6.25mg  PRN - he is not taking regularly, has refill available.  Advised he may increase his usage of Ambien right now, since this is a long term insomnia issue, limited options available from me at this point for other insomnia treatments. We can consider other sleep aid medication but often similar benefit to [place] and likely brand name higher cost.  Referral to Psychiatry today can help insomnia/mood

## 2020-02-20 NOTE — Progress Notes (Signed)
Virtual Visit via Telephone The purpose of this virtual visit is to provide medical care while limiting exposure to the novel coronavirus (COVID19) for both patient and office staff.  Consent was obtained for phone visit:  Yes.   Answered questions that patient had about telehealth interaction:  Yes.   I discussed the limitations, risks, security and privacy concerns of performing an evaluation and management service by telephone. I also discussed with the patient that there may be a patient responsible charge related to this service. The patient expressed understanding and agreed to proceed.  Patient Location: Home Provider Location: [facility] (Office)  Participants in virtual visit: - Patient: Tony Franco - CMA: Orinda Kenner, CMA - Provider: Dr Parks Ranger  ---------------------------------------------------------------------- Chief Complaint  Patient presents with  . Follow-up  . Anal Fissure  . Insomnia    S: Reviewed CMA documentation. I have called patient and gathered additional HPI as follows:  Sore throat Acute sore throat, onset few days, he had negative home covid test 2 days ago. He has admitted feeling generalized fatigue and tired and sick lately.  Bed Bugs Recent exposure out of state when visiting family, multiple bed bug bites, now no longer with bugs but he has the bites still  Giardiasis ED visit 12/13/19 Dehydrated - dramatic improvement with IV rehydration, electrolytes Dx Giardiasis - treated promptly He improved few days later.  Chronic Major Depression, recurrent  Insomnia Chronic depression and comorbid insomnia of many years, difficult to treat in past, multiple medications tried with mixed results. Patient has not been established with Psychiatry. Has seen them in ED as mentioned above when in ED for acute illness. He has denied suicidal ideation at this time. Given Mirtazapine with good results for mood, but not for sleep. New  medicine keeps him awake for hours at end. - Previously on Trazodone, had mixed results. See prior list of other psych meds.   Probiotic - diarrhea, intestinal Bioflex for knee, no relief from voltaren  Denies any known or suspected exposure to person with or possibly with COVID19.  Denies any fevers, chills, sweats, body ache, cough, shortness of breath, sinus pain or pressure, headache, abdominal pain, diarrhea  Has not received COVID Booster vaccine. Previously received   Past Medical History:  Diagnosis Date  . Depression   . Stroke [facility])    Social History   Tobacco Use  . Smoking status: Never Smoker  . Smokeless tobacco: Never Used  Vaping Use  . Vaping Use: Never used  Substance Use Topics  . Alcohol use: Yes    Alcohol/week: 3.0 standard drinks    Types: 3 Glasses of wine per week  . Drug use: Yes    Types: Marijuana, Amphetamines    Comment: in past--only marijuana still use occ  Amphetamines    Current Outpatient Medications:  .  diclofenac Sodium (VOLTAREN) 1 % GEL, Apply 2 g topically 4 (four) times daily as needed (arthritis knee pain)., Disp: 100 g, Rfl: 2 .  diltiazem 2 % GEL, Apply 1 application topically 3 (three) times daily. For 1 week as needed., Disp: 30 g, Rfl: 2 .  fexofenadine (ALLEGRA) 180 MG tablet, Take 180 mg by mouth daily. , Disp: , Rfl:  .  mirtazapine (REMERON) 30 MG tablet, Take 1 tablet (30 mg total) by mouth at bedtime., Disp: 30 tablet, Rfl: 2 .  mupirocin cream (BACTROBAN) 2 %, Apply 1 application topically 2 (two) times daily as needed. Toipcal antibiotic. For skin sores.  For 1 week as needed, Disp: 15 g, Rfl: 1 .  sildenafil (REVATIO) 20 MG tablet, Take 1-5 pills about 30 min prior to sex. Start with 1 and increase as needed., Disp: 30 tablet, Rfl: 2 .  triamcinolone cream (KENALOG) 0.5 %, Apply 1 application topically 2 (two) times daily. Topical steroid. To affected areas, for up to 2 weeks for bites, Disp: 30 g, Rfl: 1 .  zolpidem  (AMBIEN CR) 6.25 MG CR tablet, Take 1 tablet (6.25 mg total) by mouth at bedtime as needed for sleep., Disp: 30 tablet, Rfl: 2 .  albuterol (VENTOLIN HFA) 108 (90 Base) MCG/ACT inhaler, Inhale 2 puffs into the lungs every 4 (four) hours as needed for wheezing or shortness of breath (cough). (Patient not taking: Reported on 02/20/2020), Disp: 6.7 g, Rfl: 1 .  tamsulosin (FLOMAX) 0.4 MG CAPS capsule, Take 1 capsule by mouth once daily (Patient not taking: Reported on 02/20/2020), Disp: 90 capsule, Rfl: 3  Depression screen [facility] 2/9 10/17/2019 02/21/2019 11/22/2018  Decreased Interest 2 2 0  Down, Depressed, Hopeless 3 2 0  PHQ - 2 Score 5 4 0  Altered sleeping 3 3 -  Tired, decreased energy 3 3 -  Change in appetite 1 0 -  Feeling bad or failure about yourself  1 1 -  Trouble concentrating 0 1 -  Moving slowly or fidgety/restless 0 0 -  Suicidal thoughts 1 0 -  PHQ-9 Score 14 12 -  Difficult doing work/chores Somewhat difficult Not difficult at all -  Some encounter information is confidential and restricted. Go to Review Flowsheets activity to see all data.  Some recent data might be hidden    GAD 7 : Generalized Anxiety Score 10/17/2019 07/28/2017  Nervous, Anxious, on Edge 2 2  Control/stop worrying 2 3  Worry too much - different things 1 2  Trouble relaxing 0 3  Restless 1 0  Easily annoyed or irritable 1 2  Afraid - awful might happen 1 3  Total GAD 7 Score 8 15  Anxiety Difficulty Somewhat difficult Extremely difficult    -------------------------------------------------------------------------- O: No physical exam performed due to remote telephone encounter.  Lab results reviewed.  Recent Results (from the past 2160 hour(s))  Respiratory Panel by RT PCR (Flu A&B, Covid) - Nasopharyngeal Swab     Status: None   Collection Time: 12/13/19 11:27 AM   Specimen: Nasopharyngeal Swab  Result Value Ref Range   SARS Coronavirus 2 by RT PCR NEGATIVE NEGATIVE    Comment:  (NOTE) SARS-CoV-2 target nucleic acids are NOT DETECTED.  The SARS-CoV-2 RNA is generally detectable in upper respiratoy specimens during the acute phase of infection. The lowest concentration of SARS-CoV-2 viral copies this assay can detect is 131 copies/mL. A negative result does not preclude SARS-Cov-2 infection and should not be used as the sole basis for treatment or other patient management decisions. A negative result may occur with  improper specimen collection/handling, submission of specimen other than nasopharyngeal swab, presence of viral mutation(s) within the areas targeted by this assay, and inadequate number of viral copies (<131 copies/mL). A negative result must be combined with clinical observations, patient history, and epidemiological information. The expected result is Negative.  Fact Sheet for Patients:  PinkCheek.be  Fact Sheet for Healthcare Providers:  GravelBags.it  This test is no t yet approved or cleared by the [place] FDA and  has been authorized for detection and/or diagnosis of SARS-CoV-2 by FDA under an Emergency Use Authorization (EUA). This  EUA will remain  in effect (meaning this test can be used) for the duration of the COVID-19 declaration under Section 564(b)(1) of the Act, 21 U.S.C. section 360bbb-3(b)(1), unless the authorization is terminated or revoked sooner.     Influenza A by PCR NEGATIVE NEGATIVE   Influenza B by PCR NEGATIVE NEGATIVE    Comment: (NOTE) The Xpert Xpress SARS-CoV-2/FLU/RSV assay is intended as an aid in  the diagnosis of influenza from Nasopharyngeal swab specimens and  should not be used as a sole basis for treatment. Nasal washings and  aspirates are unacceptable for Xpert Xpress SARS-CoV-2/FLU/RSV  testing.  Fact Sheet for Patients: PinkCheek.be  Fact Sheet for Healthcare  Providers: GravelBags.it  This test is not yet approved or cleared by the [place] FDA and  has been authorized for detection and/or diagnosis of SARS-CoV-2 by  FDA under an Emergency Use Authorization (EUA). This EUA will remain  in effect (meaning this test can be used) for the duration of the  Covid-19 declaration under Section 564(b)(1) of the Act, 21  U.S.C. section 360bbb-3(b)(1), unless the authorization is  terminated or revoked. Performed at [facility], [address] [place], [place] [address]   CBC with Differential     Status: None   Collection Time: 12/13/19 11:36 AM  Result Value Ref Range   WBC 5.6 4.0 - 10.5 K/uL   RBC 5.65 4.22 - 5.81 MIL/uL   Hemoglobin 16.3 13.0 - 17.0 g/dL   HCT 49.5 39.0 - 52.0 %   MCV 87.6 80.0 - 100.0 fL   MCH 28.8 26.0 - 34.0 pg   MCHC 32.9 30.0 - 36.0 g/dL   RDW 12.8 11.5 - 15.5 %   Platelets 244 150 - 400 K/uL   nRBC 0.0 0.0 - 0.2 %   Neutrophils Relative % 51 %   Neutro Abs 2.8 1.7 - 7.7 K/uL   Lymphocytes Relative 37 %   Lymphs Abs 2.1 0.7 - 4.0 K/uL   Monocytes Relative 11 %   Monocytes Absolute 0.6 0.1 - 1.0 K/uL   Eosinophils Relative 1 %   Eosinophils Absolute 0.1 0.0 - 0.5 K/uL   Basophils Relative 0 %   Basophils Absolute 0.0 0.0 - 0.1 K/uL   Immature Granulocytes 0 %   Abs Immature Granulocytes 0.01 0.00 - 0.07 K/uL    Comment: Performed at [facility], [address] [place], [place] [address]  Comprehensive metabolic panel     Status: Abnormal   Collection Time: 12/13/19 11:36 AM  Result Value Ref Range   Sodium 140 135 - 145 mmol/L   Potassium 3.4 (L) 3.5 - 5.1 mmol/L   Chloride 105 98 - 111 mmol/L   CO2 25 22 - 32 mmol/L   Glucose, Bld 85 70 - 99 mg/dL    Comment: Glucose reference range applies only to samples taken after fasting for at least 8 hours.   BUN 15 8 - 23 mg/dL   Creatinine, Ser 1.10 0.61 - 1.24 mg/dL   Calcium 9.0 8.9 - 10.3 mg/dL   Total  Protein 7.5 6.5 - 8.1 g/dL   Albumin 4.2 3.5 - 5.0 g/dL   AST 16 15 - 41 U/L   ALT 19 0 - 44 U/L   Alkaline Phosphatase 74 38 - 126 U/L   Total Bilirubin 0.8 0.3 - 1.2 mg/dL   GFR, Estimated >60 >60 mL/min    Comment: (NOTE) Calculated using the CKD-EPI Creatinine Equation (2021)    Anion gap  10 5 - 15    Comment: Performed at [facility], [address] [place], [place] [address]  Ethanol     Status: None   Collection Time: 12/13/19 11:36 AM  Result Value Ref Range   Alcohol, Ethyl (B) <10 <10 mg/dL    Comment: (NOTE) Lowest detectable limit for serum alcohol is 10 mg/dL.  For medical purposes only. Performed at [facility], [address] [place], [place] [address]   Lipase, blood     Status: None   Collection Time: 12/13/19 11:36 AM  Result Value Ref Range   Lipase 29 11 - 51 U/L    Comment: Performed at [facility], [address] [place], [place] [address]  HIV Antibody (routine testing w rflx)     Status: None   Collection Time: 12/13/19 11:36 AM  Result Value Ref Range   HIV Screen 4th Generation wRfx Non Reactive Non Reactive    Comment: Performed at [facility], [place] [address] [place], [place] [address]  Troponin I (High Sensitivity)     Status: None   Collection Time: 12/13/19 11:36 AM  Result Value Ref Range   Troponin I (High Sensitivity) 4 <18 ng/L    Comment: (NOTE) Elevated high sensitivity troponin I (hsTnI) values and significant  changes across serial measurements may suggest ACS but many other  chronic and acute conditions are known to elevate hsTnI results.  Refer to the "Links" section for chest pain algorithms and additional  guidance. Performed at [facility], [address] [place], [place] [address]   Acetaminophen level     Status: Abnormal   Collection Time: 12/13/19 11:36 AM  Result Value Ref Range   Acetaminophen (Tylenol), Serum <10 (L) 10 - 30 ug/mL    Comment: (NOTE) Therapeutic  concentrations vary significantly. A range of 10-30 ug/mL  may be an effective concentration for many patients. However, some  are best treated at concentrations outside of this range. Acetaminophen concentrations >150 ug/mL at 4 hours after ingestion  and >50 ug/mL at 12 hours after ingestion are often associated with  toxic reactions.  Performed at [facility], [address] [place], [place] [id number]   Salicylate level     Status: Abnormal   Collection Time: 12/13/19 11:36 AM  Result Value Ref Range   Salicylate Lvl [id number] (L) 7.0 - 30.0 mg/dL    Comment: Performed at [facility], [address] [place], [place] [address]  Urine Drug Screen, Qualitative     Status: None   Collection Time: 12/13/19 12:56 PM  Result Value Ref Range   Tricyclic, Ur Screen NONE DETECTED NONE DETECTED   Amphetamines, Ur Screen NONE DETECTED NONE DETECTED   MDMA (Ecstasy)Ur Screen NONE DETECTED NONE DETECTED   Cocaine Metabolite,Ur [place] NONE DETECTED NONE DETECTED   Opiate, Ur Screen NONE DETECTED NONE DETECTED   Phencyclidine (PCP) Ur S NONE DETECTED NONE DETECTED   Cannabinoid 50 Ng, Ur [place] NONE DETECTED NONE DETECTED   Barbiturates, Ur Screen NONE DETECTED NONE DETECTED   Benzodiazepine, Ur Scrn NONE DETECTED NONE DETECTED   Methadone Scn, Ur NONE DETECTED NONE DETECTED    Comment: (NOTE) Tricyclics + metabolites, urine    Cutoff 1000 ng/mL Amphetamines + metabolites, urine  Cutoff 1000 ng/mL MDMA (Ecstasy), urine              Cutoff 500 ng/mL Cocaine Metabolite, urine          Cutoff 300 ng/mL  Opiate + metabolites, urine        Cutoff 300 ng/mL Phencyclidine (PCP), urine         Cutoff 25 ng/mL Cannabinoid, urine                 Cutoff 50 ng/mL Barbiturates + metabolites, urine  Cutoff 200 ng/mL Benzodiazepine, urine              Cutoff 200 ng/mL Methadone, urine                   Cutoff 300 ng/mL  The urine drug screen provides only a preliminary, unconfirmed analytical  test result and should not be used for non-medical purposes. Clinical consideration and professional judgment should be applied to any positive drug screen result due to possible interfering substances. A more specific alternate chemical method must be used in order to obtain a confirmed analytical result. Gas chromatography / mass spectrometry (GC/MS) is the preferred confirm atory method. Performed at [facility], [address] [place], [place] [address]   Urinalysis, Complete w Microscopic     Status: Abnormal   Collection Time: 12/13/19 12:56 PM  Result Value Ref Range   Color, Urine YELLOW (A) YELLOW   APPearance CLEAR (A) CLEAR   Specific Gravity, Urine 1.031 (H) 1.005 - 1.030   pH 5.0 5.0 - 8.0   Glucose, UA NEGATIVE NEGATIVE mg/dL   Hgb urine dipstick NEGATIVE NEGATIVE   Bilirubin Urine NEGATIVE NEGATIVE   Ketones, ur NEGATIVE NEGATIVE mg/dL   Protein, ur NEGATIVE NEGATIVE mg/dL   Nitrite NEGATIVE NEGATIVE   Leukocytes,Ua NEGATIVE NEGATIVE   RBC / HPF 0-5 0 - 5 RBC/hpf   WBC, UA 0-5 0 - 5 WBC/hpf   Bacteria, UA NONE SEEN NONE SEEN   Squamous Epithelial / LPF NONE SEEN 0 - 5   Mucus PRESENT    Ca Oxalate Crys, UA PRESENT     Comment: Performed at [facility], [address] [place], [place] [address]  [place] rt PCR [facility] only)     Status: None   Collection Time: 12/13/19 12:56 PM  Result Value Ref Range   Specimen source GC/Chlam URINE, RANDOM    Chlamydia Tr NOT DETECTED NOT DETECTED   N gonorrhoeae NOT DETECTED NOT DETECTED    Comment: (NOTE) This CT/NG assay has not been evaluated in patients with a history of  hysterectomy. Performed at [facility], [address] [place], [place] [address]   C Difficile Quick Screen w PCR reflex     Status: None   Collection Time: 12/13/19  2:36 PM   Specimen: Stool  Result Value Ref Range   C Diff antigen NEGATIVE NEGATIVE   C Diff toxin NEGATIVE NEGATIVE   C Diff interpretation  No C. difficile detected.     Comment: Performed at [facility], [address] [place], [place] [address]  Gastrointestinal Panel by PCR , Stool     Status: Abnormal   Collection Time: 12/13/19  2:46 PM   Specimen: Stool  Result Value Ref Range   Campylobacter species NOT DETECTED NOT DETECTED   Plesimonas shigelloides NOT DETECTED NOT DETECTED   Salmonella species NOT DETECTED NOT DETECTED   Yersinia enterocolitica NOT DETECTED NOT DETECTED   Vibrio species NOT DETECTED NOT DETECTED   Vibrio cholerae NOT DETECTED NOT DETECTED   Enteroaggregative E coli (EAEC) NOT DETECTED NOT DETECTED   Enteropathogenic E coli (EPEC) NOT DETECTED NOT DETECTED   Enterotoxigenic E  coli (ETEC) NOT DETECTED NOT DETECTED   Shiga like toxin producing E coli (STEC) NOT DETECTED NOT DETECTED   Shigella/Enteroinvasive E coli (EIEC) NOT DETECTED NOT DETECTED   Cryptosporidium NOT DETECTED NOT DETECTED   Cyclospora cayetanensis NOT DETECTED NOT DETECTED   Entamoeba histolytica NOT DETECTED NOT DETECTED   Giardia lamblia DETECTED (A) NOT DETECTED   Adenovirus F40/41 NOT DETECTED NOT DETECTED   Astrovirus NOT DETECTED NOT DETECTED   Norovirus GI/GII NOT DETECTED NOT DETECTED   Rotavirus A NOT DETECTED NOT DETECTED   Sapovirus (I, II, IV, and V) NOT DETECTED NOT DETECTED    Comment: Performed at [facility], [address] [place], [place] [address]    -------------------------------------------------------------------------- A&P:  Problem List Items Addressed This Visit    Severe recurrent major depression without psychotic features ([place]) - Primary    Chronic severe mood disorder, major depression Secondary Insomnia See last acute ED visit 12/2019 with Psychiatry evaluation Dr Weber Cooks Patient has history of suicidal ideation in past. He was determined to not meet inpatient criteria at that time and deemed appropriate for outpatient psych follow-up. This was not completed.  Patient  continues on Mirtazapine 30mg  nightly and doing very well with mood now, I have offered to refill this and recommended referral back to psychiatry outpatient for further longer term management  Will place referral to Psych      Insomnia    Chronic depression but Mood is stable now and improved after recent acute flare, ED visit previously 12/2019 Chronic insomnia, primary issue for years In past multiple medications tried including Trazodone, mixed results. Has been on variety of SSRI, SNRI as well Now improved on Mirtazapine with significant improvement in mood, but limited sleep benefit - seems to worsen his insomnia. Improved on Ambien CR 6.25mg  PRN - he is not taking regularly, has refill available.  Advised he may increase his usage of Ambien right now, since this is a long term insomnia issue, limited options available from me at this point for other insomnia treatments. We can consider other sleep aid medication but often similar benefit to [place] and likely brand name higher cost.  Referral to Psychiatry today can help insomnia/mood       Other Visit Diagnoses    Bedbug bite, initial encounter       Relevant Medications   triamcinolone cream (KENALOG) 0.5 %   mupirocin cream (BACTROBAN) 2 %   Anal fissure       Relevant Medications   diltiazem 2 % GEL     Consistent with anal fissure, based on history Possible hemorrhoids Rectal bleeding  Plan: 1. Start Diltiazem Gel 2% apply topical to anal fissure TID for 7-10 days until healed, to promote smooth muscle relaxing and blood flow for healing - Called in rx phone in to [facility] in [place]  2. He may pursue other topical treatments OTC Lidocaine as needed 3. Start Sitz Baths or warm bathtub soaks to help skin heal and keep area clean 4. Avoid constipation and straining, use stool softener probiotic as he is 5. May take NSAID PRN 6. Follow-up as needed  -----------------------------  Bedbug  bites He denies active infestation, this was when visited family out of state Now has bites still Advised no bedbug targeted medication, main goal is eradicate the bugs and then we can treat the spots symptomatically Sent rx Triamcinolone and Mupirocin topicals in  Orders Placed This Encounter  Procedures  . Ambulatory referral to Psychiatry  Referral Priority:   Routine    Referral Type:   Psychiatric    Referral Reason:   Specialty Services Required    Requested Specialty:   Psychiatry    Number of Visits Requested:   1     Meds ordered this encounter  Medications  . triamcinolone cream (KENALOG) 0.5 %    Sig: Apply 1 application topically 2 (two) times daily. Topical steroid. To affected areas, for up to 2 weeks for bites    Dispense:  30 g    Refill:  1  . mupirocin cream (BACTROBAN) 2 %    Sig: Apply 1 application topically 2 (two) times daily as needed. Toipcal antibiotic. For skin sores. For 1 week as needed    Dispense:  15 g    Refill:  1  . diltiazem 2 % GEL    Sig: Apply 1 application topically 3 (three) times daily. For 1 week as needed.    Dispense:  30 g    Refill:  2    Follow-up: - Return sooner if needed, If problems unresolved. Otherwise anticipate return within 3 months for more of a routine follow-up rather than multiple acute problems. We can offer routine visit and blood work at that point if needed  Patient verbalizes understanding with the above medical recommendations including the limitation of remote medical advice.  Specific follow-up and call-back criteria were given for patient to follow-up or seek medical care more urgently if needed.   - Time spent in direct consultation with patient on phone: 17 minutes  [name], [facility] 02/20/2020, 9:54 AM

## 2020-02-22 ENCOUNTER — Other Ambulatory Visit: Payer: Self-pay

## 2020-02-22 ENCOUNTER — Emergency Department: Payer: Medicare Other

## 2020-02-22 ENCOUNTER — Encounter: Payer: Self-pay | Admitting: *Deleted

## 2020-02-22 ENCOUNTER — Emergency Department
Admission: EM | Admit: 2020-02-22 | Discharge: 2020-02-22 | Disposition: A | Discharge status: Home / Self Care | Payer: Medicare Other | Attending: Emergency Medicine | Admitting: Emergency Medicine

## 2020-02-22 DIAGNOSIS — F419 Anxiety disorder, unspecified: Secondary | ICD-10-CM | POA: Diagnosis not present

## 2020-02-22 DIAGNOSIS — R6883 Chills (without fever): Secondary | ICD-10-CM | POA: Diagnosis not present

## 2020-02-22 DIAGNOSIS — R509 Fever, unspecified: Secondary | ICD-10-CM | POA: Diagnosis not present

## 2020-02-22 DIAGNOSIS — R0602 Shortness of breath: Secondary | ICD-10-CM | POA: Diagnosis not present

## 2020-02-22 DIAGNOSIS — Z20822 Contact with and (suspected) exposure to covid-19: Secondary | ICD-10-CM | POA: Insufficient documentation

## 2020-02-22 DIAGNOSIS — R21 Rash and other nonspecific skin eruption: Secondary | ICD-10-CM

## 2020-02-22 DIAGNOSIS — R0600 Dyspnea, unspecified: Secondary | ICD-10-CM | POA: Diagnosis not present

## 2020-02-22 LAB — COMPREHENSIVE METABOLIC PANEL
ALT: 17 U/L (ref 0–44)
AST: 19 U/L (ref 15–41)
Albumin: 3.5 g/dL (ref 3.5–5.0)
Alkaline Phosphatase: 99 U/L (ref 38–126)
Anion gap: 10 (ref 5–15)
BUN: 17 mg/dL (ref 8–23)
CO2: 25 mmol/L (ref 22–32)
Calcium: 8.9 mg/dL (ref 8.9–10.3)
Chloride: 105 mmol/L (ref 98–111)
Creatinine, Ser: 1 mg/dL (ref 0.61–1.24)
GFR, Estimated: >60 mL/min (ref >60)
Glucose, Bld: 93 mg/dL (ref 70–99)
Potassium: 3.8 mmol/L (ref 3.5–5.1)
Sodium: 140 mmol/L (ref 135–145)
Total Bilirubin: 0.6 mg/dL (ref 0.3–1.2)
Total Protein: 7.4 g/dL (ref 6.5–8.1)

## 2020-02-22 LAB — CBC WITH DIFFERENTIAL/PLATELET
Abs Immature Granulocytes: 0.04 10*3/uL (ref 0.00–0.07)
Basophils Absolute: 0 10*3/uL (ref 0.0–0.1)
Basophils Relative: 1 %
Eosinophils Absolute: 0.1 10*3/uL (ref 0.0–0.5)
Eosinophils Relative: 1 %
HCT: 39.4 % (ref 39.0–52.0)
Hemoglobin: 12.5 g/dL — ABNORMAL LOW (ref 13.0–17.0)
Immature Granulocytes: 1 %
Lymphocytes Relative: 22 %
Lymphs Abs: 1.3 10*3/uL (ref 0.7–4.0)
MCH: 28.9 pg (ref 26.0–34.0)
MCHC: 31.7 g/dL (ref 30.0–36.0)
MCV: 91 fL (ref 80.0–100.0)
Monocytes Absolute: 0.5 10*3/uL (ref 0.1–1.0)
Monocytes Relative: 9 %
Neutro Abs: 3.9 10*3/uL (ref 1.7–7.7)
Neutrophils Relative %: 66 %
Platelets: 293 10*3/uL (ref 150–400)
RBC: 4.33 MIL/uL (ref 4.22–5.81)
RDW: 13.4 % (ref 11.5–15.5)
WBC: 5.9 10*3/uL (ref 4.0–10.5)
nRBC: 0 % (ref 0.0–0.2)

## 2020-02-22 LAB — RESP PANEL BY RT-PCR (FLU A&B, COVID) ARPGX2
Influenza A by PCR: NEGATIVE
Influenza B by PCR: NEGATIVE
SARS Coronavirus 2 by RT PCR: NEGATIVE

## 2020-02-22 MED ORDER — HYDROXYZINE HCL 25 MG PO TABS: 25.0000 mg | ORAL_TABLET | Freq: Four times a day (QID) | ORAL | 0 refills | Status: DC | PRN

## 2020-02-22 NOTE — ED Triage Notes (Signed)
Patient reports rash since end of December--states flaky and itching. States he thinks it is bed bugs, got the prescription yesterday to treat--has not filled. Patient did virtual visit with provider yesterday.

## 2020-02-22 NOTE — ED Provider Notes (Signed)
[facility] Emergency Department Provider Note   ____________________________________________   Event Date/Time   First MD Initiated Contact with Patient 02/22/20 1156     (approximate)  I have reviewed the triage vital signs and the nursing notes.   HISTORY  Chief Complaint Rash   HPI WEBB BART is a 70 y.o. male presents to the ED with complaint of rash that he has had since the end of December. He reports that it is flaking and itching. Patient had a virtual visit with his provider yesterday and a prescription was sent to his pharmacy however he has not picked it up. Originally he thought he had bedbugs. He also voices concerns about his antidepressants that he has been on for several months. Patient reports shortness of breath without chest pain. He denies any fever but reports chills. He also denies any cough at this time. He denies any nausea, vomiting or diarrhea. Patient states that his primary care provider currently is making a referral to a psychiatrist for management of his depression medication.     Past Medical History:  Diagnosis Date  . Depression   . Stroke [facility])     Patient Active Problem List   Diagnosis Date Noted  . Severe recurrent major depression without psychotic features ([place]) 12/13/2019  . Symptoms of depression 05/30/2019  . BPH with obstruction/lower urinary tract symptoms 06/22/2017  . Mixed hyperlipidemia 06/20/2017  . Mild episode of recurrent major depressive disorder ([place]) 08/04/2016  . Right lateral epicondylitis 08/04/2016  . Screening for prostate cancer 08/04/2016  . Osteoarthritis of knees, bilateral 02/11/2016  . ED (erectile dysfunction) 04/20/2015  . Insomnia 04/20/2015  . Paresthesia 03/16/2015  . RLS (restless legs syndrome) 03/16/2015  . Colon cancer screening 03/16/2015    Past Surgical History:  Procedure Laterality Date  . COLONOSCOPY WITH PROPOFOL N/A 04/28/2015   Procedure: COLONOSCOPY WITH  PROPOFOL;  Surgeon: Christene Lye, MD;  Location: [facility] ENDOSCOPY;  Service: Endoscopy;  Laterality: N/A;  . COLONOSCOPY WITH PROPOFOL N/A 09/09/2019   Procedure: COLONOSCOPY WITH PROPOFOL;  Surgeon: Lesly Rubenstein, MD;  Location: [facility] ENDOSCOPY;  Service: Endoscopy;  Laterality: N/A;  . ESOPHAGOGASTRODUODENOSCOPY (EGD) WITH PROPOFOL N/A 09/09/2019   Procedure: ESOPHAGOGASTRODUODENOSCOPY (EGD) WITH PROPOFOL;  Surgeon: Lesly Rubenstein, MD;  Location: [facility] ENDOSCOPY;  Service: Endoscopy;  Laterality: N/A;  . KNEE ARTHROSCOPY     left knee    Prior to Admission medications   Medication Sig Start Date End Date Taking? Authorizing Provider  hydrOXYzine (ATARAX/VISTARIL) 25 MG tablet Take 1 tablet (25 mg total) by mouth every 6 (six) hours as needed for itching. 02/22/20  Yes Johnn Hai, PA-C  albuterol (VENTOLIN HFA) 108 (90 Base) MCG/ACT inhaler Inhale 2 puffs into the lungs every 4 (four) hours as needed for wheezing or shortness of breath (cough). Patient not taking: Reported on 02/20/2020 06/13/19   Olin Hauser, DO  diclofenac Sodium (VOLTAREN) 1 % GEL Apply 2 g topically 4 (four) times daily as needed (arthritis knee pain). 10/17/19   Karamalegos, Devonne Doughty, DO  diltiazem 2 % GEL Apply 1 application topically 3 (three) times daily. For 1 week as needed. 02/20/20   Karamalegos, Devonne Doughty, DO  fexofenadine (ALLEGRA) 180 MG tablet Take 180 mg by mouth daily.     [provider]  mirtazapine (REMERON) 30 MG tablet Take 1 tablet (30 mg total) by mouth at bedtime. 02/13/20   Karamalegos, Devonne Doughty, DO  mupirocin cream (BACTROBAN) 2 %  Apply 1 application topically 2 (two) times daily as needed. Toipcal antibiotic. For skin sores. For 1 week as needed 02/20/20   Olin Hauser, DO  sildenafil (REVATIO) 20 MG tablet Take 1-5 pills about 30 min prior to sex. Start with 1 and increase as needed. 11/27/18   Karamalegos, Devonne Doughty, DO  tamsulosin (FLOMAX) 0.4  MG CAPS capsule Take 1 capsule by mouth once daily Patient not taking: Reported on 02/20/2020 10/27/19   Olin Hauser, DO  triamcinolone cream (KENALOG) 0.5 % Apply 1 application topically 2 (two) times daily. Topical steroid. To affected areas, for up to 2 weeks for bites 02/20/20   Karamalegos, Devonne Doughty, DO  zolpidem (AMBIEN CR) 6.25 MG CR tablet Take 1 tablet (6.25 mg total) by mouth at bedtime as needed for sleep. 10/17/19   Olin Hauser, DO    Allergies Patient has no known allergies.  Family History  Problem Relation Age of Onset  . Heart attack Mother   . Heart attack Father     Social History Social History   Tobacco Use  . Smoking status: Never Smoker  . Smokeless tobacco: Never Used  Vaping Use  . Vaping Use: Never used  Substance Use Topics  . Alcohol use: Yes    Alcohol/week: 3.0 standard drinks    Types: 3 Glasses of wine per week  . Drug use: Yes    Types: Marijuana, Amphetamines    Comment: in past--only marijuana still use occ  Amphetamines    Review of Systems Constitutional: Subjective fever/chills Eyes: No visual changes. ENT: No sore throat. Cardiovascular: Denies chest pain. Respiratory: Positive shortness of breath. Negative for cough. Gastrointestinal: No abdominal pain.  No nausea, no vomiting.  No diarrhea.  No constipation. Genitourinary: Negative for dysuria. Musculoskeletal: Negative for back pain. Skin: Positive for skin rash. Neurological: Negative for headaches, focal weakness or numbness. Psychiatric:  History of depression.  ____________________________________________   PHYSICAL EXAM:  VITAL SIGNS: ED Triage Vitals  Enc Vitals Group     BP 02/22/20 1109 128/76     Pulse Rate 02/22/20 1109 91     Resp 02/22/20 1109 20     Temp 02/22/20 1109 98.5 F (36.9 C)     Temp Source 02/22/20 1109 Oral     SpO2 02/22/20 1109 99 %     Weight --      Height --      Head Circumference --      Peak Flow --       Pain Score 02/22/20 1115 0     Pain Loc --      Pain Edu? --      Excl. in [place]? --     Constitutional: Alert and oriented. Well appearing and in no acute distress. Eyes: Conjunctivae are normal.  Head: Atraumatic. Nose: No congestion/rhinnorhea. Mouth/Throat: Mucous membranes are moist.  Oropharynx non-erythematous. No edema noted to oral mucosa or tongue. Neck: No stridor.   Cardiovascular: Normal rate, regular rhythm. Grossly normal heart sounds.  Good peripheral circulation. Respiratory: Normal respiratory effort.  No retractions. Lungs CTAB. Gastrointestinal: Soft and nontender. No distention.  Musculoskeletal: No lower extremity tenderness nor edema.  No joint effusions. Neurologic:  Normal speech and language. No gross focal neurologic deficits are appreciated. No gait instability. Skin:  Skin is warm. Skin to the trunk both anterior and posterior very dry with a erythematous papular rash. No vesicles or pustules are seen. Rash is generalized. Nontender to palpation. Psychiatric: Mood and affect  are normal. Speech and behavior are normal.  ____________________________________________   LABS (all labs ordered are listed, but only abnormal results are displayed)  Labs Reviewed  CBC WITH DIFFERENTIAL/PLATELET - Abnormal; Notable for the following components:      Result Value   Hemoglobin 12.5 (*)    All other components within normal limits  RESP PANEL BY RT-PCR (FLU A&B, COVID) ARPGX2  COMPREHENSIVE METABOLIC PANEL   ____________________________________________  RADIOLOGY Leana Gamer, personally viewed and evaluated these images (plain radiographs) as part of my medical decision making, as well as reviewing the written report by the radiologist.   Official radiology report(s): DG Chest Port 1 View  Result Date: 02/22/2020 CLINICAL DATA:  Fever, dyspnea EXAM: PORTABLE CHEST 1 VIEW COMPARISON:  06/13/2019 chest radiograph. FINDINGS: Stable cardiomediastinal  silhouette with normal heart size. No pneumothorax. No pleural effusion. Minimal linear scar versus atelectasis at both lung bases. No pulmonary edema. No acute consolidative airspace disease. Healed deformity in the posterior right third rib. IMPRESSION: Minimal linear scar versus atelectasis at both lung bases. Otherwise no active disease. Electronically Signed   By: Ilona Sorrel M.D.   On: 02/22/2020 13:00    ____________________________________________   PROCEDURES  Procedure(s) performed (including Critical Care):  Procedures   ____________________________________________   INITIAL IMPRESSION / ASSESSMENT AND PLAN / ED COURSE  As part of my medical decision making, I reviewed the following data within the electronic MEDICAL RECORD NUMBER Notes from prior ED visits and [place] Controlled Substance Database  70 year old male presents to the ED with history of a rash that he has had since the end of December. He had a virtual visit with his PCP on Thursday at which time a topical medication was sent to his pharmacy which he has not picked up yet. Also has concerns about shortness of breath, anxiety, difficulty sleeping, and subjective fever and chills. He reports that he has been flying and traveling. He is unaware of any known exposure to COVID. Patient reports that his PCP is making arrangements for a psychiatrist to help manage his depression medication. He was strongly urged to pick up the prescription for the Kenalog 0.5% and begin using twice a day as directed to his rash. We also discussed using alpha Keri bath oil while his skin is wet to help hydrate his skin. Patient was given a prescription for Vistaril 25 mg every 6 hours as needed for itching. Chest x-ray showed no acute changes and lab work was within normal limits. Patient was made aware that his influenza and COVID test were negative. He is to follow-up with his PCP on Monday. ____________________________________________   FINAL  CLINICAL IMPRESSION(S) / ED DIAGNOSES  Final diagnoses:  Rash and nonspecific skin eruption  Anxiety     ED Discharge Orders         Ordered    hydrOXYzine (ATARAX/VISTARIL) 25 MG tablet  Every 6 hours PRN        02/22/20 1445          *Please note:  TAEJON IRANI was evaluated in Emergency Department on 02/22/2020 for the symptoms described in the history of present illness. He was evaluated in the context of the global COVID-19 pandemic, which necessitated consideration that the patient might be at risk for infection with the SARS-CoV-2 virus that causes COVID-19. Institutional protocols and algorithms that pertain to the evaluation of patients at risk for COVID-19 are in a state of rapid change based on information released by regulatory bodies  including the CDC and federal and state organizations. These policies and algorithms were followed during the patient's care in the ED.  Some ED evaluations and interventions may be delayed as a result of limited staffing during and the pandemic.*   Note:  This document was prepared using Dragon voice recognition software and may include unintentional dictation errors.    Johnn Hai, PA-C 02/22/20 1603    Vanessa Biehle, MD 02/23/20 [phone]

## 2020-02-22 NOTE — Discharge Instructions (Addendum)
Call your primary care provider on Monday to see if they can refer you to a psychiatrist for evaluation and management of your depression medication.  Get the cream that was prescribed for you and begin using it twice a day every day.  Also alpha Keri bath oil can be applied to your skin while your skin is wet which will help hydrate it.  Hydroxyzine was sent to the pharmacy to help with any itching and discomfort you are having from your rash.

## 2020-03-02 ENCOUNTER — Emergency Department
Admission: EM | Admit: 2020-03-02 | Discharge: 2020-03-02 | Disposition: A | Discharge status: Home / Self Care | Payer: Medicare Other | Attending: Student in an Organized Health Care Education/Training Program | Admitting: Student in an Organized Health Care Education/Training Program

## 2020-03-02 ENCOUNTER — Encounter: Payer: Self-pay | Admitting: Emergency Medicine

## 2020-03-02 ENCOUNTER — Other Ambulatory Visit: Payer: Self-pay

## 2020-03-02 ENCOUNTER — Emergency Department: Payer: Medicare Other

## 2020-03-02 DIAGNOSIS — R112 Nausea with vomiting, unspecified: Secondary | ICD-10-CM | POA: Diagnosis not present

## 2020-03-02 DIAGNOSIS — R1110 Vomiting, unspecified: Secondary | ICD-10-CM | POA: Diagnosis not present

## 2020-03-02 DIAGNOSIS — Z20822 Contact with and (suspected) exposure to covid-19: Secondary | ICD-10-CM | POA: Insufficient documentation

## 2020-03-02 DIAGNOSIS — R1111 Vomiting without nausea: Secondary | ICD-10-CM | POA: Diagnosis not present

## 2020-03-02 DIAGNOSIS — R42 Dizziness and giddiness: Secondary | ICD-10-CM | POA: Diagnosis not present

## 2020-03-02 DIAGNOSIS — R110 Nausea: Secondary | ICD-10-CM | POA: Diagnosis not present

## 2020-03-02 LAB — CBC
HCT: 41.1 % (ref 39.0–52.0)
Hemoglobin: 13.2 g/dL (ref 13.0–17.0)
MCH: 28.4 pg (ref 26.0–34.0)
MCHC: 32.1 g/dL (ref 30.0–36.0)
MCV: 88.4 fL (ref 80.0–100.0)
Platelets: 296 10*3/uL (ref 150–400)
RBC: 4.65 MIL/uL (ref 4.22–5.81)
RDW: 13.4 % (ref 11.5–15.5)
WBC: 7.3 10*3/uL (ref 4.0–10.5)
nRBC: 0 % (ref 0.0–0.2)

## 2020-03-02 LAB — HEPATIC FUNCTION PANEL
ALT: 24 U/L (ref 0–44)
AST: 27 U/L (ref 15–41)
Albumin: 3.3 g/dL — ABNORMAL LOW (ref 3.5–5.0)
Alkaline Phosphatase: 100 U/L (ref 38–126)
Bilirubin, Direct: 0.1 mg/dL (ref 0.0–0.2)
Indirect Bilirubin: 0.5 mg/dL (ref 0.3–0.9)
Total Bilirubin: 0.6 mg/dL (ref 0.3–1.2)
Total Protein: 7.6 g/dL (ref 6.5–8.1)

## 2020-03-02 LAB — BASIC METABOLIC PANEL
Anion gap: 11 (ref 5–15)
BUN: 16 mg/dL (ref 8–23)
CO2: 23 mmol/L (ref 22–32)
Calcium: 9 mg/dL (ref 8.9–10.3)
Chloride: 106 mmol/L (ref 98–111)
Creatinine, Ser: 0.9 mg/dL (ref 0.61–1.24)
GFR, Estimated: >60 mL/min (ref >60)
Glucose, Bld: 141 mg/dL — ABNORMAL HIGH (ref 70–99)
Potassium: 3.9 mmol/L (ref 3.5–5.1)
Sodium: 140 mmol/L (ref 135–145)

## 2020-03-02 LAB — POC SARS CORONAVIRUS 2 AG -  ED: SARS Coronavirus 2 Ag: NEGATIVE

## 2020-03-02 LAB — LIPASE, BLOOD: Lipase: 33 U/L (ref 11–51)

## 2020-03-02 LAB — TROPONIN I (HIGH SENSITIVITY): Troponin I (High Sensitivity): 3 ng/L (ref <18)

## 2020-03-02 MED ORDER — DIAZEPAM 5 MG PO TABS
5.0000 mg | ORAL_TABLET | Freq: Once | ORAL | Status: AC
  Administered 2020-03-02: 5 mg via ORAL
  Filled 2020-03-02: qty 1

## 2020-03-02 MED ORDER — ONDANSETRON HCL 4 MG/2ML IJ SOLN
4.0000 mg | Freq: Once | INTRAMUSCULAR | Status: AC
  Administered 2020-03-02: 4 mg via INTRAVENOUS

## 2020-03-02 MED ORDER — ONDANSETRON 4 MG PO TBDP
4.0000 mg | ORAL_TABLET | Freq: Once | ORAL | Status: AC | PRN
  Administered 2020-03-02: 4 mg via ORAL

## 2020-03-02 MED ORDER — MECLIZINE HCL 25 MG PO TABS: 25.0000 mg | ORAL_TABLET | Freq: Two times a day (BID) | ORAL | 0 refills | Status: DC | PRN

## 2020-03-02 MED ORDER — ONDANSETRON 4 MG PO TBDP
ORAL_TABLET | ORAL | Status: AC
  Filled 2020-03-02: qty 1

## 2020-03-02 MED ORDER — PROMETHAZINE HCL 12.5 MG PO TABS: 12.5000 mg | ORAL_TABLET | Freq: Four times a day (QID) | ORAL | 0 refills | Status: DC | PRN

## 2020-03-02 MED ORDER — ONDANSETRON HCL 4 MG/2ML IJ SOLN
INTRAMUSCULAR | Status: AC
  Filled 2020-03-02: qty 2

## 2020-03-02 MED ORDER — LACTATED RINGERS IV BOLUS
1000.0000 mL | Freq: Once | INTRAVENOUS | Status: AC
  Administered 2020-03-02: 1000 mL via INTRAVENOUS

## 2020-03-02 MED ORDER — LORAZEPAM 2 MG/ML IJ SOLN
1.0000 mg | Freq: Once | INTRAMUSCULAR | Status: AC
  Administered 2020-03-02: 1 mg via INTRAVENOUS
  Filled 2020-03-02: qty 1

## 2020-03-02 NOTE — ED Triage Notes (Signed)
Patient to ER for c/o dizziness with vomiting (approx 15-20 episodes) since waking this am. Denies any known fevers.

## 2020-03-02 NOTE — ED Notes (Signed)
Pt is feeling a lot better but is still slightly nausea pt says. X-Ray at bedside.

## 2020-03-02 NOTE — ED Notes (Signed)
First Nurse Note: Pt to ED via [facility] from home for dizziness and vomiting that started this morning at 0600. Pt is in NAD.

## 2020-03-02 NOTE — ED Provider Notes (Signed)
[facility] Emergency Department Provider Note    Event Date/Time   First MD Initiated Contact with Patient 03/02/20 1114     (approximate)  I have reviewed the triage vital signs and the nursing notes.   HISTORY  Chief Complaint Dizziness and Emesis    HPI Tony Franco is a 70 y.o. male below listed past medical history presents to the ER for evaluation of dizziness nausea vomiting.  States he woke up this morning with severe dizziness feeling like the room was spinning.  States he had to crawl to the bathroom.  Feels like it is worsened with position changes and movement.  Cannot get it to improve.  States he had a few brief episodes of similar symptoms over the past few weeks has never been diagnosed with vertigo.  Denies any numbness or tingling.  Denies any chest pain.    Past Medical History:  Diagnosis Date  . Depression   . Stroke [facility])    Family History  Problem Relation Age of Onset  . Heart attack Mother   . Heart attack Father    Past Surgical History:  Procedure Laterality Date  . COLONOSCOPY WITH PROPOFOL N/A 04/28/2015   Procedure: COLONOSCOPY WITH PROPOFOL;  Surgeon: Christene Lye, MD;  Location: [facility] ENDOSCOPY;  Service: Endoscopy;  Laterality: N/A;  . COLONOSCOPY WITH PROPOFOL N/A 09/09/2019   Procedure: COLONOSCOPY WITH PROPOFOL;  Surgeon: Lesly Rubenstein, MD;  Location: [facility] ENDOSCOPY;  Service: Endoscopy;  Laterality: N/A;  . ESOPHAGOGASTRODUODENOSCOPY (EGD) WITH PROPOFOL N/A 09/09/2019   Procedure: ESOPHAGOGASTRODUODENOSCOPY (EGD) WITH PROPOFOL;  Surgeon: Lesly Rubenstein, MD;  Location: [facility] ENDOSCOPY;  Service: Endoscopy;  Laterality: N/A;  . KNEE ARTHROSCOPY     left knee   Patient Active Problem List   Diagnosis Date Noted  . Severe recurrent major depression without psychotic features ([place]) 12/13/2019  . Symptoms of depression 05/30/2019  . BPH with obstruction/lower urinary tract symptoms 06/22/2017  .  Mixed hyperlipidemia 06/20/2017  . Mild episode of recurrent major depressive disorder ([place]) 08/04/2016  . Right lateral epicondylitis 08/04/2016  . Screening for prostate cancer 08/04/2016  . Osteoarthritis of knees, bilateral 02/11/2016  . ED (erectile dysfunction) 04/20/2015  . Insomnia 04/20/2015  . Paresthesia 03/16/2015  . RLS (restless legs syndrome) 03/16/2015  . Colon cancer screening 03/16/2015      Prior to Admission medications   Medication Sig Start Date End Date Taking? Authorizing Provider  meclizine (ANTIVERT) 25 MG tablet Take 1 tablet (25 mg total) by mouth 2 (two) times daily as needed for dizziness. 03/02/20  Yes Merlyn Lot, MD  promethazine (PHENERGAN) 12.5 MG tablet Take 1 tablet (12.5 mg total) by mouth every 6 (six) hours as needed. 03/02/20  Yes Merlyn Lot, MD  albuterol (VENTOLIN HFA) 108 (90 Base) MCG/ACT inhaler Inhale 2 puffs into the lungs every 4 (four) hours as needed for wheezing or shortness of breath (cough). Patient not taking: Reported on 02/20/2020 06/13/19   Olin Hauser, DO  diclofenac Sodium (VOLTAREN) 1 % GEL Apply 2 g topically 4 (four) times daily as needed (arthritis knee pain). 10/17/19   Karamalegos, Devonne Doughty, DO  diltiazem 2 % GEL Apply 1 application topically 3 (three) times daily. For 1 week as needed. 02/20/20   Karamalegos, Devonne Doughty, DO  fexofenadine (ALLEGRA) 180 MG tablet Take 180 mg by mouth daily.     [provider]  hydrOXYzine (ATARAX/VISTARIL) 25 MG tablet Take 1 tablet (25 mg total) by  mouth every 6 (six) hours as needed for itching. 02/22/20   Johnn Hai, PA-C  mirtazapine (REMERON) 30 MG tablet Take 1 tablet (30 mg total) by mouth at bedtime. 02/13/20   Karamalegos, Devonne Doughty, DO  mupirocin cream (BACTROBAN) 2 % Apply 1 application topically 2 (two) times daily as needed. Toipcal antibiotic. For skin sores. For 1 week as needed 02/20/20   Olin Hauser, DO  sildenafil (REVATIO) 20  MG tablet Take 1-5 pills about 30 min prior to sex. Start with 1 and increase as needed. 11/27/18   Karamalegos, Devonne Doughty, DO  tamsulosin (FLOMAX) 0.4 MG CAPS capsule Take 1 capsule by mouth once daily Patient not taking: Reported on 02/20/2020 10/27/19   Olin Hauser, DO  triamcinolone cream (KENALOG) 0.5 % Apply 1 application topically 2 (two) times daily. Topical steroid. To affected areas, for up to 2 weeks for bites 02/20/20   Karamalegos, Devonne Doughty, DO  zolpidem (AMBIEN CR) 6.25 MG CR tablet Take 1 tablet (6.25 mg total) by mouth at bedtime as needed for sleep. 10/17/19   Olin Hauser, DO    Allergies Patient has no known allergies.    Social History Social History   Tobacco Use  . Smoking status: Never Smoker  . Smokeless tobacco: Never Used  Vaping Use  . Vaping Use: Never used  Substance Use Topics  . Alcohol use: Yes    Alcohol/week: 3.0 standard drinks    Types: 3 Glasses of wine per week  . Drug use: Yes    Types: Marijuana, Amphetamines    Comment: in past--only marijuana still use occ  Amphetamines    Review of Systems Patient denies headaches, rhinorrhea, blurry vision, numbness, shortness of breath, chest pain, edema, cough, abdominal pain, nausea, vomiting, diarrhea, dysuria, fevers, rashes or hallucinations unless otherwise stated above in HPI. ____________________________________________   PHYSICAL EXAM:  VITAL SIGNS: Vitals:   03/02/20 1500 03/02/20 1525  BP: 113/74   Pulse: 75   Resp: 18   Temp:  97.7 F (36.5 C)  SpO2: 96%     Constitutional: Alert and oriented.  Eyes: Conjunctivae are normal.  Head: Atraumatic. Nose: No congestion/rhinnorhea. Mouth/Throat: Mucous membranes are moist.   Neck: No stridor. Painless ROM.  Cardiovascular: Normal rate, regular rhythm. Grossly normal heart sounds.  Good peripheral circulation. Respiratory: Normal respiratory effort.  No retractions. Lungs CTAB. Gastrointestinal: Soft and  nontender. No distention. No abdominal bruits. No CVA tenderness. Genitourinary:  Musculoskeletal: No lower extremity tenderness nor edema.  No joint effusions. Neurologic:  CN- intact.  No facial droop, Normal FNF.  Normal heel to shin.  Sensation intact bilaterally. Normal speech and language. No gross focal neurologic deficits are appreciated. No gait instability. Skin:  Skin is warm, dry and intact. No rash noted. Psychiatric: Mood and affect are normal. Speech and behavior are normal.  ____________________________________________   LABS (all labs ordered are listed, but only abnormal results are displayed)  Results for orders placed or performed during the hospital encounter of 03/02/20 (from the past 24 hour(s))  Basic metabolic panel     Status: Abnormal   Collection Time: 03/02/20  9:09 AM  Result Value Ref Range   Sodium 140 135 - 145 mmol/L   Potassium 3.9 3.5 - 5.1 mmol/L   Chloride 106 98 - 111 mmol/L   CO2 23 22 - 32 mmol/L   Glucose, Bld 141 (H) 70 - 99 mg/dL   BUN 16 8 - 23 mg/dL   Creatinine, Ser  0.90 0.61 - 1.24 mg/dL   Calcium 9.0 8.9 - 10.3 mg/dL   GFR, Estimated >60 >60 mL/min   Anion gap 11 5 - 15  CBC     Status: None   Collection Time: 03/02/20  9:09 AM  Result Value Ref Range   WBC 7.3 4.0 - 10.5 K/uL   RBC 4.65 4.22 - 5.81 MIL/uL   Hemoglobin 13.2 13.0 - 17.0 g/dL   HCT 41.1 39.0 - 52.0 %   MCV 88.4 80.0 - 100.0 fL   MCH 28.4 26.0 - 34.0 pg   MCHC 32.1 30.0 - 36.0 g/dL   RDW 13.4 11.5 - 15.5 %   Platelets 296 150 - 400 K/uL   nRBC 0.0 0.0 - 0.2 %  Troponin I (High Sensitivity)     Status: None   Collection Time: 03/02/20  9:09 AM  Result Value Ref Range   Troponin I (High Sensitivity) 3 <18 ng/L  Lipase, blood     Status: None   Collection Time: 03/02/20  9:09 AM  Result Value Ref Range   Lipase 33 11 - 51 U/L  Hepatic function panel     Status: Abnormal   Collection Time: 03/02/20  9:09 AM  Result Value Ref Range   Total Protein 7.6 6.5 -  8.1 g/dL   Albumin 3.3 (L) 3.5 - 5.0 g/dL   AST 27 15 - 41 U/L   ALT 24 0 - 44 U/L   Alkaline Phosphatase 100 38 - 126 U/L   Total Bilirubin 0.6 0.3 - 1.2 mg/dL   Bilirubin, Direct 0.1 0.0 - 0.2 mg/dL   Indirect Bilirubin 0.5 0.3 - 0.9 mg/dL  POC SARS Coronavirus 2 Ag-ED - Nasal Swab     Status: None   Collection Time: 03/02/20  2:38 PM  Result Value Ref Range   SARS Coronavirus 2 Ag NEGATIVE NEGATIVE   ____________________________________________  EKG My review and personal interpretation at Time: 9:07   Indication: diziness  Rate: 80  Rhythm: sinus Axis: normal Other: nonspecific st abn, no stemi ____________________________________________  RADIOLOGY  I personally reviewed all radiographic images ordered to evaluate for the above acute complaints and reviewed radiology reports and findings.  These findings were personally discussed with the patient.  Please see medical record for radiology report.  ____________________________________________   PROCEDURES  Procedure(s) performed:  Procedures    Critical Care performed: no ____________________________________________   INITIAL IMPRESSION / ASSESSMENT AND PLAN / ED COURSE  Pertinent labs & imaging results that were available during my care of the patient were reviewed by me and considered in my medical decision making (see chart for details).   DDX: Vertigo, electrolyte abnormality, mass, CVA, BPPV, medication reaction  KHA ORBIN is a 70 y.o. who presents to the ED with symptoms as described above.  Symptoms seem primarily vertiginous.  However he is at increased risk of CVA given his age.  Does not any focal deficits.  He is outside the window as he woke up with the symptoms.  Abdomen order IV fluids as well as antiemetic and symptomatic management will order CT imaging.  Clinical Course as of 03/02/20 1533  Mon Mar 02, 2020  1241 CT imaging reviewed.  Reassessment patient symptoms seem to be improved after  Ativan.  Will give additional Valium.  Will order MRI to further evaluate for possible stroke.  Location of current reported CT abnormality does not appear consistent with his symptoms at this time.  I discussed case in consultation  with neurology who recommends MRI. [PR]  1440 Patient reassessed.  Feeling improved.  MRI does not show any evidence of infarct or acute abnormality.  He is not having any focal deficits I think this is more clinically consistent with vertigo particular now given reassuring MRI. [PR]  1532 Patient reassessed again.  Tolerating p.o.  Symptoms improved.  He is not back to 100% but appears clinically stable.  Not consistent with CVA.  Is blood work is otherwise reassuring is hemodynamically stable think is appropriate for outpatient referral.  We discussed signs and symptoms for which he should return to the ER. [PR]    Clinical Course User Index [PR] Merlyn Lot, MD    The patient was evaluated in Emergency Department today for the symptoms described in the history of present illness. He/she was evaluated in the context of the global COVID-19 pandemic, which necessitated consideration that the patient might be at risk for infection with the SARS-CoV-2 virus that causes COVID-19. Institutional protocols and algorithms that pertain to the evaluation of patients at risk for COVID-19 are in a state of rapid change based on information released by regulatory bodies including the CDC and federal and state organizations. These policies and algorithms were followed during the patient's care in the ED.  As part of my medical decision making, I reviewed the following data within the [place] notes reviewed and incorporated, Labs reviewed, notes from prior ED visits and [place] Controlled Substance Database   ____________________________________________   FINAL CLINICAL IMPRESSION(S) / ED DIAGNOSES  Final diagnoses:  Dizziness  Non-intractable vomiting  with nausea, unspecified vomiting type      NEW MEDICATIONS STARTED DURING THIS VISIT:  New Prescriptions   MECLIZINE (ANTIVERT) 25 MG TABLET    Take 1 tablet (25 mg total) by mouth 2 (two) times daily as needed for dizziness.   PROMETHAZINE (PHENERGAN) 12.5 MG TABLET    Take 1 tablet (12.5 mg total) by mouth every 6 (six) hours as needed.     Note:  This document was prepared using Dragon voice recognition software and may include unintentional dictation errors.    Merlyn Lot, MD 03/02/20 1534

## 2020-03-02 NOTE — ED Notes (Signed)
Pt transported to MRI at this time 

## 2020-03-03 LAB — SARS CORONAVIRUS 2 (TAT 6-24 HRS): SARS Coronavirus 2: NEGATIVE

## 2020-03-04 DIAGNOSIS — G4700 Insomnia, unspecified: Secondary | ICD-10-CM | POA: Diagnosis not present

## 2020-03-04 DIAGNOSIS — F331 Major depressive disorder, recurrent, moderate: Secondary | ICD-10-CM | POA: Diagnosis not present

## 2020-03-06 DIAGNOSIS — H903 Sensorineural hearing loss, bilateral: Secondary | ICD-10-CM | POA: Diagnosis not present

## 2020-03-06 DIAGNOSIS — R42 Dizziness and giddiness: Secondary | ICD-10-CM | POA: Diagnosis not present

## 2020-03-06 DIAGNOSIS — R519 Headache, unspecified: Secondary | ICD-10-CM | POA: Diagnosis not present

## 2020-03-06 DIAGNOSIS — H8111 Benign paroxysmal vertigo, right ear: Secondary | ICD-10-CM | POA: Diagnosis not present

## 2020-03-11 DIAGNOSIS — R42 Dizziness and giddiness: Secondary | ICD-10-CM | POA: Diagnosis not present

## 2020-03-12 DIAGNOSIS — F39 Unspecified mood [affective] disorder: Secondary | ICD-10-CM | POA: Diagnosis not present

## 2020-03-12 DIAGNOSIS — Z87898 Personal history of other specified conditions: Secondary | ICD-10-CM | POA: Insufficient documentation

## 2020-03-12 DIAGNOSIS — R7303 Prediabetes: Secondary | ICD-10-CM | POA: Diagnosis not present

## 2020-03-12 DIAGNOSIS — Z23 Encounter for immunization: Secondary | ICD-10-CM | POA: Diagnosis not present

## 2020-03-13 ENCOUNTER — Ambulatory Visit: Payer: Self-pay | Admitting: *Deleted

## 2020-03-13 NOTE — Telephone Encounter (Signed)
Patient calling to request appt to start process for FMLA paperwork. Patient reports he continues to suffer from dizziness and decrease balance x 1 week. Patient has been seen by multiple physicians for this issue. C/o roaring in ears,  Vertigo, not sleeping well, room spinning. Patient reports he is required to hold on the furniture to walk and assist with balance. Has been seen in ED last Wednesday. Patient has seen ENT and stopped all medications at this time but rectal cream since Sunday.. C/o continued body rash. Patient reports he is depressed due to multiple medical challenges over the past 7 months and is now seeing a psychiatrist. Denies vomiting , weakness, visual disturbances at this time. appt scheduled for 03/20/20. Care advise given. Patient verbalized understanding of care advise and to call back or go to ED if symptoms worsen.  Reason for Disposition . [1] MODERATE dizziness (e.g., vertigo; feels very unsteady, interferes with normal activities) AND [2] has been evaluated by physician for this  Answer Assessment - Initial Assessment Questions 1. DESCRIPTION: "Describe your dizziness."     Room spinning and must move slow to walk or move  2. VERTIGO: "Do you feel like either you or the room is spinning or tilting?"      Room is spinning but some better than last week 3. LIGHTHEADED: "Do you feel lightheaded?" (e.g., somewhat faint, woozy, weak upon standing)     Yes . Dizziness with standing  4. SEVERITY: "How bad is it?"  "Can you walk?"   - MILD: Feels unsteady but walking normally.   - MODERATE: Feels very unsteady when walking, but not falling; interferes with normal activities (e.g., school, work) .   - SEVERE: Unable to walk without falling, or requires assistance to walk without falling.     Moderate  5. ONSET:  "When did the dizziness begin?"     1 week ago  6. AGGRAVATING FACTORS: "Does anything make it worse?" (e.g., standing, change in head position)     Yes. Changing  positions vertigo is constant  7. CAUSE: "What do you think is causing the dizziness?"     Not sure . Has had multiple visits to ED , ENT , and now psychiatrist  8. RECURRENT SYMPTOM: "Have you had dizziness before?" If Yes, ask: "When was the last time?" "What happened that time?"     Na  9. OTHER SYMPTOMS: "Do you have any other symptoms?" (e.g., headache, weakness, numbness, vomiting, earache)     Decrease balance, roaring in ears, not sleeping well 10. PREGNANCY: "Is there any chance you are pregnant?" "When was your last menstrual period?"       na  Protocols used: DIZZINESS - VERTIGO-A-AH

## 2020-03-18 DIAGNOSIS — H6980 Other specified disorders of Eustachian tube, unspecified ear: Secondary | ICD-10-CM | POA: Diagnosis not present

## 2020-03-18 DIAGNOSIS — R42 Dizziness and giddiness: Secondary | ICD-10-CM | POA: Diagnosis not present

## 2020-03-20 ENCOUNTER — Other Ambulatory Visit: Payer: Self-pay

## 2020-03-20 ENCOUNTER — Encounter: Payer: Self-pay | Admitting: Family Medicine

## 2020-03-20 ENCOUNTER — Ambulatory Visit (INDEPENDENT_AMBULATORY_CARE_PROVIDER_SITE_OTHER): Payer: Medicare Other | Admitting: Family Medicine

## 2020-03-20 VITALS — BP 120/72 | HR 71 | Ht 72.0 in | Wt 177.8 lb

## 2020-03-20 DIAGNOSIS — R197 Diarrhea, unspecified: Secondary | ICD-10-CM

## 2020-03-20 DIAGNOSIS — R42 Dizziness and giddiness: Secondary | ICD-10-CM | POA: Diagnosis not present

## 2020-03-20 DIAGNOSIS — F332 Major depressive disorder, recurrent severe without psychotic features: Secondary | ICD-10-CM | POA: Diagnosis not present

## 2020-03-20 DIAGNOSIS — F5101 Primary insomnia: Secondary | ICD-10-CM | POA: Diagnosis not present

## 2020-03-20 NOTE — Patient Instructions (Addendum)
Thank you for coming to the office today.  FMLA will be done today or by Monday, can let you know to pick up original and we can fax to your employer  Please schedule a Follow-up Appointment to: Return if symptoms worsen or fail to improve.  If you have any other questions or concerns, please feel free to call the office or send a message through [place]. You may also schedule an earlier appointment if necessary.  Additionally, you may be receiving a survey about your experience at our office within a few days to 1 week by e-mail or mail. We value your feedback.  [name], DO [place]

## 2020-03-20 NOTE — Progress Notes (Signed)
Subjective:    Patient ID: Tony Franco, male    DOB: [birth year], 70 y.o.   MRN: [id number]  Tony Franco is a 70 y.o. male presenting on 03/20/2020 for Follow-up (FMLA paperwork) and Depression   HPI    Chronic Major Depression, recurrent  Insomnia Chronic depression and comorbid insomnia of many years, difficult to treat in past, multiple medications tried with mixed results. Patient has not been established with Psychiatry. Has seen them in ED as mentioned above when in ED for acute illness. He has denied suicidal ideation at this time. Given Mirtazapine with good results for mood, but not for sleep. New medicine keeps him awake for hours at end. - Previously on Trazodone, had mixed results. See prior list of other psych meds. - he has now come off of all meds in past 1.5 weeks including trazodone and not taking Zopidem - he will follow up as scheduled w/ Psychiatry. And Therapist - He had side effect or toxicity to Mirtazapine off for 1 month now Now able to get to sleep around 12 midnight. Admits Tearful crying episodes Looking at getting a dog, companion. He feels he cannot work due to mental health and other significant health concerns now Support system with brother and sister call him 1-2 times a day  Last 7 months, 5 ED visits  Recent ED Visit, Vertigo, hearing loss. Vertigo improved but still present. Had [facility] testing by Dr Richardson Landry, some exercises for vertigo. Gradual improving tinnitus. Admits gradual improving headache.  Additionally significant GI illness in past with Giardia and diarrhea and dehydration, has been scheduled now to see GI specialist, has had multiple ED visits in past with severe dehydration debilitating.   Depression screen [facility] 2/9 03/20/2020 10/17/2019 02/21/2019  Decreased Interest 3 2 2   Down, Depressed, Hopeless 2 3 2   PHQ - 2 Score 5 5 4   Altered sleeping 3 3 3   Tired, decreased energy 3 3 3   Change in appetite 1 1 0  Feeling bad or  failure about yourself  1 1 1   Trouble concentrating 2 0 1  Moving slowly or fidgety/restless 1 0 0  Suicidal thoughts 0 1 0  PHQ-9 Score 16 14 12   Difficult doing work/chores Very difficult Somewhat difficult Not difficult at all  Some encounter information is confidential and restricted. Go to Review Flowsheets activity to see all data.  Some recent data might be hidden    Social History   Tobacco Use  . Smoking status: Never Smoker  . Smokeless tobacco: Never Used  Vaping Use  . Vaping Use: Never used  Substance Use Topics  . Alcohol use: Yes    Alcohol/week: 3.0 standard drinks    Types: 3 Glasses of wine per week  . Drug use: Yes    Types: Marijuana, Amphetamines    Comment: in past--only marijuana still use occ  Amphetamines    Review of Systems Per HPI unless specifically indicated above     Objective:    BP 120/72   Pulse 71   Ht 6' (1.829 m)   Wt 177 lb 12.8 oz (80.6 kg)   SpO2 99%   BMI 24.11 kg/m   Wt Readings from Last 3 Encounters:  03/20/20 177 lb 12.8 oz (80.6 kg)  03/02/20 178 lb (80.7 kg)  02/20/20 176 lb (79.8 kg)    Physical Exam Vitals and nursing note reviewed.  Constitutional:      General: He is not in acute distress.  Appearance: He is well-developed and well-nourished. He is not diaphoretic.     Comments: Well-appearing, comfortable, cooperative  HENT:     Head: Normocephalic and atraumatic.     Mouth/Throat:     Mouth: Oropharynx is clear and moist.  Eyes:     General:        Right eye: No discharge.        Left eye: No discharge.     Conjunctiva/sclera: Conjunctivae normal.  Cardiovascular:     Rate and Rhythm: Normal rate.  Pulmonary:     Effort: Pulmonary effort is normal.  Musculoskeletal:        General: No edema.  Skin:    General: Skin is warm and dry.     Findings: No erythema or rash.  Neurological:     Mental Status: He is alert and oriented to person, place, and time.  Psychiatric:        Mood and Affect:  Mood and affect normal.        Behavior: Behavior normal.     Comments: Well groomed, good eye contact, normal speech and thoughts. Depressed and tearful crying    Results for orders placed or performed during the hospital encounter of 03/02/20  SARS CORONAVIRUS 2 (TAT 6-24 HRS) Nasopharyngeal Nasopharyngeal Swab   Specimen: Nasopharyngeal Swab  Result Value Ref Range   SARS Coronavirus 2 NEGATIVE NEGATIVE  Basic metabolic panel  Result Value Ref Range   Sodium 140 135 - 145 mmol/L   Potassium 3.9 3.5 - 5.1 mmol/L   Chloride 106 98 - 111 mmol/L   CO2 23 22 - 32 mmol/L   Glucose, Bld 141 (H) 70 - 99 mg/dL   BUN 16 8 - 23 mg/dL   Creatinine, Ser 0.90 0.61 - 1.24 mg/dL   Calcium 9.0 8.9 - 10.3 mg/dL   GFR, Estimated >60 >60 mL/min   Anion gap 11 5 - 15  CBC  Result Value Ref Range   WBC 7.3 4.0 - 10.5 K/uL   RBC 4.65 4.22 - 5.81 MIL/uL   Hemoglobin 13.2 13.0 - 17.0 g/dL   HCT 41.1 39.0 - 52.0 %   MCV 88.4 80.0 - 100.0 fL   MCH 28.4 26.0 - 34.0 pg   MCHC 32.1 30.0 - 36.0 g/dL   RDW 13.4 11.5 - 15.5 %   Platelets 296 150 - 400 K/uL   nRBC 0.0 0.0 - 0.2 %  Lipase, blood  Result Value Ref Range   Lipase 33 11 - 51 U/L  Hepatic function panel  Result Value Ref Range   Total Protein 7.6 6.5 - 8.1 g/dL   Albumin 3.3 (L) 3.5 - 5.0 g/dL   AST 27 15 - 41 U/L   ALT 24 0 - 44 U/L   Alkaline Phosphatase 100 38 - 126 U/L   Total Bilirubin 0.6 0.3 - 1.2 mg/dL   Bilirubin, Direct 0.1 0.0 - 0.2 mg/dL   Indirect Bilirubin 0.5 0.3 - 0.9 mg/dL  POC SARS Coronavirus 2 Ag-ED - Nasal Swab  Result Value Ref Range   SARS Coronavirus 2 Ag NEGATIVE NEGATIVE  Troponin I (High Sensitivity)  Result Value Ref Range   Troponin I (High Sensitivity) 3 <18 ng/L      Assessment & Plan:   Problem List Items Addressed This Visit    Severe recurrent major depression without psychotic features (HCC) - Primary   Relevant Medications   buPROPion (WELLBUTRIN SR) 100 MG 12 hr tablet   Insomnia  Other Visit Diagnoses    Vertigo       Diarrhea, unspecified type          Chronic depression with acute flares Chronic insomnia, primary issue for years In past multiple medications tried including Trazodone, mixed results. Has been on variety of SSRI, SNRI as well Failed MIrtazapine with reaction side effects, off for 1 mont hnow Off Ambien PRN for insomnia  No longer on any med, self discontinued Following with Pyschiatry and therapy  Will pursue FMLA with incapacity from 03/02/20 through 06/07/20 and anticipate return reduced hours 06/08/20 for up to 3 months approximately.  He cannot work currently due to severe mental health and associated illness with other factors vertigo diarrhea, giardia episodes and dehydration.  No orders of the defined types were placed in this encounter.     Follow up plan: Return if symptoms worsen or fail to improve.   [name], [facility] 03/20/2020, 8:19 AM

## 2020-03-25 DIAGNOSIS — G4700 Insomnia, unspecified: Secondary | ICD-10-CM | POA: Diagnosis not present

## 2020-03-25 DIAGNOSIS — F331 Major depressive disorder, recurrent, moderate: Secondary | ICD-10-CM | POA: Diagnosis not present

## 2020-04-06 ENCOUNTER — Other Ambulatory Visit: Payer: Self-pay

## 2020-04-06 DIAGNOSIS — F5101 Primary insomnia: Secondary | ICD-10-CM

## 2020-04-07 MED ORDER — ZOLPIDEM TARTRATE ER 6.25 MG PO TBCR: 6.2500 mg | EXTENDED_RELEASE_TABLET | Freq: Every evening | ORAL | 2 refills | Status: DC | PRN

## 2020-04-08 DIAGNOSIS — H903 Sensorineural hearing loss, bilateral: Secondary | ICD-10-CM | POA: Diagnosis not present

## 2020-04-08 DIAGNOSIS — R42 Dizziness and giddiness: Secondary | ICD-10-CM | POA: Diagnosis not present

## 2020-04-09 DIAGNOSIS — E559 Vitamin D deficiency, unspecified: Secondary | ICD-10-CM | POA: Diagnosis not present

## 2020-04-09 DIAGNOSIS — Z87898 Personal history of other specified conditions: Secondary | ICD-10-CM | POA: Diagnosis not present

## 2020-04-09 DIAGNOSIS — F39 Unspecified mood [affective] disorder: Secondary | ICD-10-CM | POA: Diagnosis not present

## 2020-04-09 DIAGNOSIS — E519 Thiamine deficiency, unspecified: Secondary | ICD-10-CM | POA: Diagnosis not present

## 2020-04-09 DIAGNOSIS — E538 Deficiency of other specified B group vitamins: Secondary | ICD-10-CM | POA: Diagnosis not present

## 2020-04-09 DIAGNOSIS — Z8619 Personal history of other infectious and parasitic diseases: Secondary | ICD-10-CM | POA: Diagnosis not present

## 2020-04-09 DIAGNOSIS — R21 Rash and other nonspecific skin eruption: Secondary | ICD-10-CM | POA: Diagnosis not present

## 2020-04-09 DIAGNOSIS — G44221 Chronic tension-type headache, intractable: Secondary | ICD-10-CM | POA: Diagnosis not present

## 2020-04-13 DIAGNOSIS — F39 Unspecified mood [affective] disorder: Secondary | ICD-10-CM | POA: Diagnosis not present

## 2020-04-13 DIAGNOSIS — Z114 Encounter for screening for human immunodeficiency virus [HIV]: Secondary | ICD-10-CM | POA: Diagnosis not present

## 2020-04-13 DIAGNOSIS — A539 Syphilis, unspecified: Secondary | ICD-10-CM | POA: Diagnosis not present

## 2020-04-20 ENCOUNTER — Ambulatory Visit: Payer: Self-pay | Admitting: Physician Assistant

## 2020-04-20 ENCOUNTER — Other Ambulatory Visit: Payer: Self-pay

## 2020-04-20 DIAGNOSIS — Z113 Encounter for screening for infections with a predominantly sexual mode of transmission: Secondary | ICD-10-CM

## 2020-04-20 DIAGNOSIS — A539 Syphilis, unspecified: Secondary | ICD-10-CM

## 2020-04-20 DIAGNOSIS — E538 Deficiency of other specified B group vitamins: Secondary | ICD-10-CM | POA: Diagnosis not present

## 2020-04-20 MED ORDER — PENICILLIN G BENZATHINE 1200000 UNIT/2ML IM SUSP
2.4000 10*6.[IU] | Freq: Once | INTRAMUSCULAR | Status: AC
  Administered 2020-04-20: 2.4 10*6.[IU] via INTRAMUSCULAR

## 2020-04-21 ENCOUNTER — Encounter: Payer: Self-pay | Admitting: Physician Assistant

## 2020-04-21 NOTE — Progress Notes (Signed)
   [facility] Department STI clinic/screening visit  Subjective:  Tony Franco is a 70 y.o. male being seen today for an STI screening visit. The patient reports they do have symptoms.    Patient has the following medical conditions:   Patient Active Problem List   Diagnosis Date Noted  . Severe recurrent major depression without psychotic features ([place]) 12/13/2019  . Symptoms of depression 05/30/2019  . BPH with obstruction/lower urinary tract symptoms 06/22/2017  . Mixed hyperlipidemia 06/20/2017  . Right lateral epicondylitis 08/04/2016  . Screening for prostate cancer 08/04/2016  . Osteoarthritis of knees, bilateral 02/11/2016  . ED (erectile dysfunction) 04/20/2015  . Insomnia 04/20/2015  . Paresthesia 03/16/2015  . RLS (restless legs syndrome) 03/16/2015  . Colon cancer screening 03/16/2015     Chief Complaint  Patient presents with  . SEXUALLY TRANSMITTED DISEASE    screening    HPI  Patient reports that he was sent here by his MD at [facility] for treatment of Syphilis.  States that he has had a sore on his tongue for [redacted] weeks along with multiple other medical issues since November of 2021.  States that he had a "full screening" for STDs at his PCP office and brings documentation showing that his RPR was reactive at 1:128.  Declines testing today and requests treatment only.   See flowsheet for further details and programmatic requirements.    The following portions of the patient's history were reviewed and updated as appropriate: allergies, current medications, past medical history, past social history, past surgical history and problem list.  Objective:  There were no vitals filed for this visit.  Physical Exam Constitutional:      General: He is not in acute distress.    Appearance: Normal appearance.  HENT:     Head: Normocephalic and atraumatic.  Eyes:     Conjunctiva/sclera: Conjunctivae normal.  Pulmonary:     Effort: Pulmonary effort is  normal.  Skin:    General: Skin is warm and dry.  Neurological:     Mental Status: He is alert and oriented to person, place, and time.  Psychiatric:        Mood and Affect: Mood normal.        Behavior: Behavior normal.        Thought Content: Thought content normal.        Judgment: Judgment normal.       Assessment and Plan:  [name] is a 70 y.o. male presenting to the [facility] Department for STI screening  1. Screening for STD (sexually transmitted disease) Patient into clinic with symptoms. Counseled patient that he will need Bicillin 2.4 mu IM one time. Reassured patient that he has early Syphilis due to lesion on tongue and has had negative Syphilis serology within the last year. Enc patient to follow up with PCP, therapist and psychiatrist per appointments for ongoing care. Rec condoms with all sex.   2. Syphilis Will treat for Syphilis with Bicillin 2.4 mu IM today. No sex for 14 days and until after partner/s complete treatment. RTC at 6 mo, 12 mo, and annually for titer monitoring. Reviewed with patient normal SE after injections and to use OTC analgesics and warm compresses as needed for these symptoms. - penicillin g benzathine (BICILLIN LA) 1200000 UNIT/2ML injection 2.4 Million Units     Return in about 6 months (around 10/21/2020) for titer recheck, prn.  No future appointments.  Jerene Dilling, PA

## 2020-04-22 DIAGNOSIS — F331 Major depressive disorder, recurrent, moderate: Secondary | ICD-10-CM | POA: Diagnosis not present

## 2020-04-22 DIAGNOSIS — G4700 Insomnia, unspecified: Secondary | ICD-10-CM | POA: Diagnosis not present

## 2020-04-24 DIAGNOSIS — A539 Syphilis, unspecified: Secondary | ICD-10-CM | POA: Diagnosis not present

## 2020-04-24 DIAGNOSIS — N489 Disorder of penis, unspecified: Secondary | ICD-10-CM | POA: Diagnosis not present

## 2020-04-24 DIAGNOSIS — K6289 Other specified diseases of anus and rectum: Secondary | ICD-10-CM | POA: Diagnosis not present

## 2020-04-24 DIAGNOSIS — Z114 Encounter for screening for human immunodeficiency virus [HIV]: Secondary | ICD-10-CM | POA: Diagnosis not present

## 2020-04-27 ENCOUNTER — Ambulatory Visit
Admission: RE | Admit: 2020-04-27 | Discharge: 2020-04-27 | Disposition: A | Discharge status: Home / Self Care | Payer: Medicare Other | Source: Ambulatory Visit | Attending: Infectious Diseases | Admitting: Infectious Diseases

## 2020-04-27 ENCOUNTER — Ambulatory Visit
Admission: RE | Admit: 2020-04-27 | Discharge: 2020-04-27 | Disposition: A | Discharge status: Home / Self Care | Payer: Self-pay | Source: Ambulatory Visit | Attending: Infectious Diseases | Admitting: Infectious Diseases

## 2020-04-27 ENCOUNTER — Other Ambulatory Visit: Payer: Self-pay

## 2020-04-27 DIAGNOSIS — A523 Neurosyphilis, unspecified: Secondary | ICD-10-CM | POA: Diagnosis not present

## 2020-04-27 DIAGNOSIS — E538 Deficiency of other specified B group vitamins: Secondary | ICD-10-CM | POA: Diagnosis not present

## 2020-04-27 MED ORDER — SODIUM CHLORIDE 0.9% FLUSH: 10.0000 mL | Freq: Two times a day (BID) | INTRAVENOUS | Status: DC

## 2020-04-27 MED ORDER — CHLORHEXIDINE GLUCONATE CLOTH 2 % EX PADS: 6.0000 | MEDICATED_PAD | Freq: Every day | CUTANEOUS | Status: DC

## 2020-04-27 MED ORDER — SODIUM CHLORIDE 0.9% FLUSH: 10.0000 mL | INTRAVENOUS | Status: DC | PRN

## 2020-04-27 NOTE — Progress Notes (Signed)
Peripherally Inserted Central Catheter Placement  The IV Nurse has discussed with the patient and/or persons authorized to consent for the patient, the purpose of this procedure and the potential benefits and risks involved with this procedure.  The benefits include less needle sticks, lab draws from the catheter, and the patient may be discharged home with the catheter. Risks include, but not limited to, infection, bleeding, blood clot (thrombus formation), and puncture of an artery; nerve damage and irregular heartbeat and possibility to perform a PICC exchange if needed/ordered by physician.  Alternatives to this procedure were also discussed.  Bard Power PICC patient education guide, fact sheet on infection prevention and patient information card has been provided to patient /or left at bedside.    PICC Placement Documentation  PICC Single Lumen 04/27/20 PICC Right Brachial 38 cm 0 cm (Active)  Indication for Insertion or Continuance of Line Home intravenous therapies (PICC only) 04/27/20 1504  Exposed Catheter (cm) 0 cm 04/27/20 1504  Site Assessment Clean;Dry;Intact 04/27/20 1504  Line Status Flushed;Saline locked;Blood return noted 04/27/20 1504  Dressing Type Transparent 04/27/20 1504  Dressing Status Clean;Dry;Intact 04/27/20 1504  Antimicrobial disc in place? Yes 04/27/20 1504  Dressing Intervention New dressing 04/27/20 1504  Dressing Change Due 05/04/20 04/27/20 1504       Gordan Payment 04/27/2020, 3:05 PM

## 2020-04-28 ENCOUNTER — Other Ambulatory Visit: Payer: Self-pay | Admitting: Neurology

## 2020-04-28 DIAGNOSIS — F1991 Other psychoactive substance use, unspecified, in remission: Secondary | ICD-10-CM

## 2020-04-28 DIAGNOSIS — Z87898 Personal history of other specified conditions: Secondary | ICD-10-CM

## 2020-04-28 DIAGNOSIS — F321 Major depressive disorder, single episode, moderate: Secondary | ICD-10-CM | POA: Diagnosis not present

## 2020-04-29 DIAGNOSIS — A523 Neurosyphilis, unspecified: Secondary | ICD-10-CM | POA: Diagnosis not present

## 2020-04-29 DIAGNOSIS — K6289 Other specified diseases of anus and rectum: Secondary | ICD-10-CM | POA: Diagnosis not present

## 2020-04-29 DIAGNOSIS — Z792 Long term (current) use of antibiotics: Secondary | ICD-10-CM | POA: Diagnosis not present

## 2020-04-29 DIAGNOSIS — K602 Anal fissure, unspecified: Secondary | ICD-10-CM | POA: Diagnosis not present

## 2020-05-04 DIAGNOSIS — E538 Deficiency of other specified B group vitamins: Secondary | ICD-10-CM | POA: Diagnosis not present

## 2020-05-06 DIAGNOSIS — F321 Major depressive disorder, single episode, moderate: Secondary | ICD-10-CM | POA: Diagnosis not present

## 2020-05-06 DIAGNOSIS — A523 Neurosyphilis, unspecified: Secondary | ICD-10-CM | POA: Diagnosis not present

## 2020-05-06 NOTE — Progress Notes (Signed)
Patient on schedule for LP 4/1 with pre procedure instructions given. Made aware to be here @ 0830, driver post procedure/recovery, is not on any blood thinners. Stated understanding.

## 2020-05-08 ENCOUNTER — Other Ambulatory Visit: Payer: Self-pay

## 2020-05-08 ENCOUNTER — Ambulatory Visit
Admission: RE | Admit: 2020-05-08 | Discharge: 2020-05-08 | Disposition: A | Discharge status: Home / Self Care | Payer: Medicare Other | Source: Ambulatory Visit | Attending: Neurology | Admitting: Neurology

## 2020-05-08 DIAGNOSIS — R519 Headache, unspecified: Secondary | ICD-10-CM | POA: Insufficient documentation

## 2020-05-08 DIAGNOSIS — Z87898 Personal history of other specified conditions: Secondary | ICD-10-CM | POA: Insufficient documentation

## 2020-05-08 DIAGNOSIS — F1991 Other psychoactive substance use, unspecified, in remission: Secondary | ICD-10-CM

## 2020-05-08 HISTORY — DX: Dizziness and giddiness: R42

## 2020-05-08 HISTORY — DX: Syphilis, unspecified: A53.9

## 2020-05-08 HISTORY — DX: Transient cerebral ischemic attack, unspecified: G45.9

## 2020-05-08 LAB — GLUCOSE, CSF: Glucose, CSF: 50 mg/dL (ref 40–70)

## 2020-05-08 LAB — CSF CELL COUNT WITH DIFFERENTIAL
Eosinophils, CSF: 0 %
Lymphs, CSF: 93 %
Monocyte-Macrophage-Spinal Fluid: 7 %
RBC Count, CSF: 22 /mm3 — ABNORMAL HIGH (ref 0–3)
Segmented Neutrophils-CSF: 0 %
Tube #: 3
WBC, CSF: 19 /mm3 (ref 0–5)

## 2020-05-08 LAB — SALICYLATE LEVEL: Salicylate Lvl: <7 mg/dL — ABNORMAL LOW (ref 7.0–30.0)

## 2020-05-08 LAB — PROTEIN, CSF: Total  Protein, CSF: 51 mg/dL — ABNORMAL HIGH (ref 15–45)

## 2020-05-08 LAB — CRYPTOCOCCAL ANTIGEN, CSF: Crypto Ag: NEGATIVE

## 2020-05-08 MED ORDER — ACETAMINOPHEN 500 MG PO TABS
1000.0000 mg | ORAL_TABLET | Freq: Four times a day (QID) | ORAL | Status: DC | PRN
  Filled 2020-05-08: qty 2

## 2020-05-11 DIAGNOSIS — A523 Neurosyphilis, unspecified: Secondary | ICD-10-CM | POA: Diagnosis not present

## 2020-05-11 DIAGNOSIS — E538 Deficiency of other specified B group vitamins: Secondary | ICD-10-CM | POA: Diagnosis not present

## 2020-05-11 LAB — FUNGUS STAIN

## 2020-05-11 LAB — IGG CSF INDEX
Albumin CSF-mCnc: 30 mg/dL (ref 15–55)
Albumin: 3.9 g/dL (ref 3.8–4.8)
CSF IgG Index: 0.6 (ref 0.0–0.7)
IgG (Immunoglobin G), Serum: 1698 mg/dL — ABNORMAL HIGH (ref 603–1613)
IgG, CSF: 7.5 mg/dL (ref 0.0–10.3)
IgG/Alb Ratio, CSF: 0.25 (ref 0.00–0.25)

## 2020-05-11 LAB — VDRL, CSF: VDRL Quant, CSF: 1:4 {titer} — ABNORMAL HIGH

## 2020-05-11 LAB — CYTOLOGY - NON PAP

## 2020-05-11 LAB — FUNGAL STAIN REFLEX

## 2020-05-12 DIAGNOSIS — F321 Major depressive disorder, single episode, moderate: Secondary | ICD-10-CM | POA: Diagnosis not present

## 2020-05-12 LAB — OLIGOCLONAL BANDS, CSF + SERM

## 2020-05-12 LAB — COMP PANEL: LEUKEMIA/LYMPHOMA: PATH INTERP XXX-IMP: DETECTED

## 2020-05-13 ENCOUNTER — Ambulatory Visit: Payer: Self-pay | Admitting: Physician Assistant

## 2020-05-13 ENCOUNTER — Other Ambulatory Visit: Payer: Self-pay

## 2020-05-13 ENCOUNTER — Other Ambulatory Visit: Payer: Self-pay | Admitting: Family Medicine

## 2020-05-13 DIAGNOSIS — F330 Major depressive disorder, recurrent, mild: Secondary | ICD-10-CM

## 2020-05-13 DIAGNOSIS — F5101 Primary insomnia: Secondary | ICD-10-CM

## 2020-05-13 DIAGNOSIS — A539 Syphilis, unspecified: Secondary | ICD-10-CM

## 2020-05-13 MED ORDER — PENICILLIN G BENZATHINE 1200000 UNIT/2ML IM SUSP
2.4000 10*6.[IU] | Freq: Once | INTRAMUSCULAR | Status: AC
  Administered 2020-05-13: 2.4 10*6.[IU] via INTRAMUSCULAR

## 2020-05-14 ENCOUNTER — Encounter: Payer: Self-pay | Admitting: Physician Assistant

## 2020-05-14 NOTE — Progress Notes (Signed)
S:  Patient into clinic requesting Bicillin to complete his treatment for early neurosyphilis per his ID provider's recommendation.  Patient provides copy of email/mychart message from his provider that recommends "another IM shot of penicillin to complete his course of treatment".  Patient states that he had his last infusion treatment on Sunday and is due for picc line removal later this afternoon.  States NKDA and that he has a follow up appointment with ID doctor. O:  WDWN male in NAD, A&O x 3, normal work of breathing. A/P:  1.  Patient here for Bicillin 2.4 mu IM to complete course of treatment for early neurosyphilis. 2.  Reviewed with patient SE of injections and using OTC analgesics and warm compresses for these SE. 3.  Bicillin 2.4 mu IM to be given today. 4.  Rec no sex for 10-14 days. 5.  Enc condoms with all sex. 6.  Enc to keep follow up appointment with ID and PCP per recommendations. 7.  RTC prn.

## 2020-05-19 DIAGNOSIS — F321 Major depressive disorder, single episode, moderate: Secondary | ICD-10-CM | POA: Diagnosis not present

## 2020-05-20 DIAGNOSIS — G4700 Insomnia, unspecified: Secondary | ICD-10-CM | POA: Diagnosis not present

## 2020-05-20 DIAGNOSIS — F331 Major depressive disorder, recurrent, moderate: Secondary | ICD-10-CM | POA: Diagnosis not present

## 2020-05-25 DIAGNOSIS — K6289 Other specified diseases of anus and rectum: Secondary | ICD-10-CM | POA: Diagnosis not present

## 2020-05-25 DIAGNOSIS — A523 Neurosyphilis, unspecified: Secondary | ICD-10-CM | POA: Diagnosis not present

## 2020-05-25 DIAGNOSIS — Z114 Encounter for screening for human immunodeficiency virus [HIV]: Secondary | ICD-10-CM | POA: Diagnosis not present

## 2020-06-02 DIAGNOSIS — F321 Major depressive disorder, single episode, moderate: Secondary | ICD-10-CM | POA: Diagnosis not present

## 2020-06-09 DIAGNOSIS — F321 Major depressive disorder, single episode, moderate: Secondary | ICD-10-CM | POA: Diagnosis not present

## 2020-06-10 DIAGNOSIS — A523 Neurosyphilis, unspecified: Secondary | ICD-10-CM | POA: Diagnosis not present

## 2020-06-16 DIAGNOSIS — F321 Major depressive disorder, single episode, moderate: Secondary | ICD-10-CM | POA: Diagnosis not present

## 2020-06-17 DIAGNOSIS — F331 Major depressive disorder, recurrent, moderate: Secondary | ICD-10-CM | POA: Diagnosis not present

## 2020-06-17 DIAGNOSIS — G4700 Insomnia, unspecified: Secondary | ICD-10-CM | POA: Diagnosis not present

## 2020-06-18 DIAGNOSIS — R7303 Prediabetes: Secondary | ICD-10-CM | POA: Diagnosis not present

## 2020-06-18 DIAGNOSIS — F332 Major depressive disorder, recurrent severe without psychotic features: Secondary | ICD-10-CM | POA: Diagnosis not present

## 2020-06-18 DIAGNOSIS — A523 Neurosyphilis, unspecified: Secondary | ICD-10-CM | POA: Diagnosis not present

## 2020-06-18 DIAGNOSIS — Z125 Encounter for screening for malignant neoplasm of prostate: Secondary | ICD-10-CM | POA: Diagnosis not present

## 2020-06-18 DIAGNOSIS — R270 Ataxia, unspecified: Secondary | ICD-10-CM | POA: Diagnosis not present

## 2020-06-18 DIAGNOSIS — M25519 Pain in unspecified shoulder: Secondary | ICD-10-CM | POA: Diagnosis not present

## 2020-06-18 DIAGNOSIS — R799 Abnormal finding of blood chemistry, unspecified: Secondary | ICD-10-CM | POA: Diagnosis not present

## 2020-06-25 DIAGNOSIS — F321 Major depressive disorder, single episode, moderate: Secondary | ICD-10-CM | POA: Diagnosis not present

## 2020-06-30 DIAGNOSIS — F321 Major depressive disorder, single episode, moderate: Secondary | ICD-10-CM | POA: Diagnosis not present

## 2020-07-01 ENCOUNTER — Other Ambulatory Visit: Payer: Self-pay

## 2020-07-01 ENCOUNTER — Ambulatory Visit: Payer: Medicare Other | Attending: Internal Medicine

## 2020-07-01 DIAGNOSIS — M6281 Muscle weakness (generalized): Secondary | ICD-10-CM

## 2020-07-01 DIAGNOSIS — M542 Cervicalgia: Secondary | ICD-10-CM | POA: Diagnosis not present

## 2020-07-01 DIAGNOSIS — R2689 Other abnormalities of gait and mobility: Secondary | ICD-10-CM | POA: Insufficient documentation

## 2020-07-01 DIAGNOSIS — R262 Difficulty in walking, not elsewhere classified: Secondary | ICD-10-CM

## 2020-07-01 DIAGNOSIS — R269 Unspecified abnormalities of gait and mobility: Secondary | ICD-10-CM

## 2020-07-01 NOTE — Therapy (Signed)
[facility] [address], [place], [address] Phone: [phone]   Fax:  [phone]  Physical Therapy Evaluation  Patient Details  Name: Tony Franco MRN: [id number] Date of Birth: 03/17/1950 Referring Provider (PT): Frazier Richards   Encounter Date: 07/01/2020   PT End of Session - 07/01/20 1459    Visit Number 1    Number of Visits 17    Date for PT Re-Evaluation 08/26/20    PT Start Time 1350    PT Stop Time 4008    PT Time Calculation (min) 44 min    Activity Tolerance Patient tolerated treatment well;No increased pain    Behavior During Therapy WFL for tasks assessed/performed           Past Medical History:  Diagnosis Date  . Depression   . Stroke ([place])   . Syphilis   . TIA (transient ischemic attack)   . Vertigo     Past Surgical History:  Procedure Laterality Date  . COLONOSCOPY WITH PROPOFOL N/A 04/28/2015   Procedure: COLONOSCOPY WITH PROPOFOL;  Surgeon: Christene Lye, MD;  Location: [facility] ENDOSCOPY;  Service: Endoscopy;  Laterality: N/A;  . COLONOSCOPY WITH PROPOFOL N/A 09/09/2019   Procedure: COLONOSCOPY WITH PROPOFOL;  Surgeon: Lesly Rubenstein, MD;  Location: [facility] ENDOSCOPY;  Service: Endoscopy;  Laterality: N/A;  . ESOPHAGOGASTRODUODENOSCOPY (EGD) WITH PROPOFOL N/A 09/09/2019   Procedure: ESOPHAGOGASTRODUODENOSCOPY (EGD) WITH PROPOFOL;  Surgeon: Lesly Rubenstein, MD;  Location: [facility] ENDOSCOPY;  Service: Endoscopy;  Laterality: N/A;  . KNEE ARTHROSCOPY     left knee    There were no vitals filed for this visit.    Subjective Assessment - 07/01/20 1400    Subjective Pt is a pleasant 70 y.o. male referred to PT for imbalance and gait ataxia s/p neurosyphilis diagnosis. Pt has not had any falls, but wishes to improve his imbalance, and neck pain.    Pertinent History Pt is a 70 y.o. male referred to PT for ataxia, imbalance, headaches/dizziness and shoulder/neck pain.  Pt has PMH of recent diagnosis of neurosyphilis, depression, prediabetes, history of TIA's. Intermittent crystal meth use since September 2020. Neurosyphilis diagnosis lead to gait abnormality, ataxia, and imbalance. Lumbar puncture was performed on April 1st.  Although balance, head aches, and dizziness is improving, pt still appears to be having tension head aches, vertigo and imbalance. ENT believes pt may have vestibular neuritis. Head aches located at B temporal region that is squeezing in nature with nausea, ear congestion, dizziness (room spinning), and light sensitivity. The head aches begin after episodes of dizziness.  MRI performed on 03/02/20 with no evidence of red flags. Mild, chronic microvascular ischemic changes reported. Pt reports head movements and transferring from sit to stand and stand to sit pt loses his balance with lateral listing R > L. Denies spinning of room, lightheadedness, or swimminess but at end of session does retract statement about room spinning and reports sometimes he blieves when he moves his head he feels the beginnings of the room to spin. Worse in mornings when wkaing up and when wlaking and having to head turn or change direction quickly. Pt also denies any real falls but when gait abnormalities was bad he reports running into walls constantly for support. Pt denies loss of b/b function, sensation changes or significant weight loss. Pt reports his memory deficits are still present specifically with short term memory. Pt's goals with PT are to improve his walking and shoulder/neck pain  without feeling like he has to walk into walls or falling. Pt believes his neck and shoulder tension could be related to his imbalance and wishes to work on both as able.    Limitations House hold activities;Walking;Standing    Patient Stated Goals improve his walking and shoulder/neck pain without feeling like he has to walk into walls.    Currently in Pain? Yes    Pain Score 1     Pain  Location Neck    Pain Orientation Right;Left    Pain Descriptors / Indicators Aching    Pain Radiating Towards shoulders up to 3/10 NPS    Pain Onset More than a month ago    Pain Frequency Intermittent    Aggravating Factors  bending forward to read              [facility] PT Assessment - 07/02/20 0001      Assessment   Medical Diagnosis Neurosyphilis    Referring Provider (PT) Frazier Richards    Onset Date/Surgical Date --   October 2021   Hand Dominance Right      Prior Function   Level of Independence Independent      Cognition   Overall Cognitive Status Within Functional Limits for tasks assessed    Memory --   Reports short term memory loss     Sensation   Light Touch Appears Intact   UE's/LE's     Coordination   Finger Nose Finger Test Intact bilaterally    Heel Shin Test Intact bilaterally   Difficulty flexing L hip against gravity         PAIN: 1/10 pain in cervical region.  POSTURE: Slouched in sitting. Slight forward head and rounded shoulders in sitting and standing.   PROM/AROM AROM BUE: Full AROM and painless PROM BLE: Full and painless.  AROM BLE: L hip in sitting limited due to significant weakness. In supine, equal hip AROM bilaterally in flexion, IR/ER.     STRENGTH:  Graded on a 0-5 scale Muscle Group Left Right  Shoulder flex 5/5 5/5  Shoulder Abd 5/5 5/5  Shoulder Ext NT NT  Shoulder IR/ER 5/5 5/5  Elbow 5/5 5/5  Wrist/hand 5/5 5/5  Hip Flex 3-/5 4+/5  Hip Abd (sitting) Test next sessoin Test next sessoin    Hip Add NT NT  Hip Ext NT NT  Hip IR/ER 4/5 5/5  Knee Flex NT NT  Knee Ext 4/5 4/5  Ankle DF Atleast 3/5 noted with gait Atleast 3/5 noted with gait  Ankle PF Next session Next session   SENSATION:  BUE : Intact BLE : Intact  NEUROLOGICAL SCREEN: (2+ unless otherwise noted.) N=normal  Ab=abnormal   Level Dermatome R L  C3 Anterior Neck  N N  C4 Top of Shoulder N N  C5 Lateral Upper Arm  N N  C6 Lateral Arm/  Thumb  N N  C7 Middle Finger  N N  C8 4th & 5th Finger N N  T1 Medial Arm N N  L2 Medial thigh/groin N N  L3 Lower thigh/med.knee N N  L4 Medial leg/lat thigh N N  L5 Lat. leg & dorsal foot N N  S1 post/lat foot/thigh/leg N N  S2 Post./med. thigh & leg N N    SOMATOSENSORY:  Any N & T in extremities or weakness: reports Significant L hip flexion weakness compared to RLE. No N/T appreciated.         Sensation  Intact      Diminished         Absent  Light touch LEs                              COORDINATION: Finger to Nose: Normal bilaterally         Heel Shin Slide Test: normal bilaterally          RAMPS:         Alternating pronation/supination in UE's: Normal          Foot taps for LE's: Normal     MUSCULOSKELETAL SCREEN: Cervical Spine ROM: Next session     OCULOMOTOR / VESTIBULAR TESTING:  Oculomotor Exam- Room Light  Findings Comments  Ocular Alignment normal   Ocular ROM normal   Spontaneous Nystagmus normal   Gaze-Holding Nystagmus not examined No gaze evoked nystagmus present during testing  End-Gaze Nystagmus not examined   Vergence (normal 2-3") not examined   Smooth Pursuit normal   Cross-Cover Test not examined   Saccades normal   VOR Cancellation not examined   Left Head Impulse not examined   Right Head Impulse not examined   Static Acuity not examined   Dynamic Acuity not examined     BPPV to be assessed next session   FUNCTIONAL MOBILITY:  Gait: R lateral listing intermittently with gait requiring steppage strategy to correct. Able to indep. Reciprocal pattern with equal, normal arm swing bilaterally.   Lunges: With RLE in front, no strength limitations noted. With LLE in front, significant sway and weakness appreaicted with returning to upright posture requiring min-modA+1 to correct LOB and assist pt to upright standing.  Sit to stand: Able to perform without UE support. Slight shift towards RLE > LLE due to weakness in  LLE.   OUTCOME MEASURES: TEST Outcome Interpretation  5 times sit<>stand 13.7 sec >60 yo, >15 sec indicates increased risk for falls  FOTO 53/68   FGA Next session       < 22/30 indicative of increased risk of falls                          Objective measurements completed on examination: See above findings.    PT Education - 07/01/20 1458    Education Details Gravity reduced hip flexion and supine SLR due to significant, proximal LLE weakness in hip flexor and knee extensor.    Person(s) Educated Patient    Methods Explanation;Demonstration;Tactile cues;Verbal cues    Comprehension Verbalized understanding;Returned demonstration            PT Short Term Goals - 07/01/20 1754      PT SHORT TERM GOAL #1   Title Pt will be indep with HEP to improve functional mobility.    Baseline 5/25: established    Time 4    Period Weeks    Status New    Target Date 07/29/20             PT Long Term Goals - 07/01/20 1755      PT LONG TERM GOAL #1   Title Pt will improve FOTO to target score to indicate clinicially significant improvement in functional mobility.    Baseline 5/25: 53/68    Time 8    Period Weeks    Status New    Target Date 08/26/20      PT LONG TERM GOAL #2   Title Pt  will improve FGA to > 25/30 to indicate clinically significant reduction in falls.    Baseline 5/25: Perform next session    Time 8    Period Weeks    Status New    Target Date 08/26/20      PT LONG TERM GOAL #3   Title Pt will be able to perform L hip flexion to 4/5 via MMT to display clinically significant improvements in hip flexion strength.    Baseline 5/25: 2+/5 on L hip flexion.    Time 8    Period Weeks    Status New    Target Date 08/26/20      PT LONG TERM GOAL #4   Title Pt will report no dizziness/imbalance when extending cervical spine to perform overhead ADLs and getting out of bed.    Baseline 5/25: IMbalance and beginning of room to spin when looking up     Time 8    Period Weeks    Status New    Target Date 08/26/20      PT LONG TERM GOAL #5   Title Pt will report being able to wlak > 1 mile with no lateral listing to indicate a decreased risk of falls with community ambulation tasks.    Baseline 5/25: R lateral list with stepping strategy to correct LOb wlaking in clinic.    Time 8    Period Weeks    Status New    Target Date 08/26/20      Additional Long Term Goals   Additional Long Term Goals Yes      PT LONG TERM GOAL #6   Title Pt will improve 5xSTS to < 12 sec to indicate decreased falls risk and improvement in BLE strength.    Baseline 5/25: 13.7 sec    Time 8    Period Weeks    Status New    Target Date 08/26/20                  Plan - 07/02/20 0804    Clinical Impression Statement Pt is a pleasant 70 y.o. male referred to PT for gait abnormality and ataxia s/p neurosyphilis diagnosis. PMH includes: depression, prediabetes, hx of TIA's, and arthritis. Although pt denies falls, pt presents with abnormality of gait with intermittent R lat listing and reuqires stepping strategy to correct balance but able to independently. Overall pt does have reciprocal pattern only requiring PT supervision. Any central involvement of vestibular issues r/o with normal smooth pursuits, saccades with no under or over shooting or gaze evoked nystagmus. Grossly normal coordination and RAMPS in BUE's and BLE's. Normal AROM in UE's and RLE. Significnat noted proximal weakness is appreciated in LLE with hip flexion 3-/5 and knee extension with 3+/5 with decreased motor control noted requiring increased time for pt to focus and attempt to perform motion. This was visibly concerningnand upsetting to pt and reports "this is new". However lower quarter neuro screen with strength and sensation is normal bilaterally besides these two muscle groups. Slight weakness appreciated in L hip rotators. Upper quarter neuro screen normal. In gravity redcued position  pt has no difficulty performing L hip flexion and SLR. PROM in BLE's pt has full motion in joints in all planes but did not assess extension. Pt does presenet with 5xSTS > 12 sec and unable to safely perform lunge with LLE in front indicative of general LE weakness but LLE > RLE requiring PT assist to prevent pt form falling with L lunge. Pt  late to therapy session and required extra time for subjective due to extensive PMH so not all objective material assessed that PT would like to assess. Next session PT plans to screen for BPPV, assess VOR, assess cervical AROM, and perform FGA due to balance impairments and reports of what appears could be positional vertigo. PT called referring provider to update MD on pt's concern for new onset of LLE proximal weakness that does not seem to be appreciated at previous MD visits to keep MD informed and is awaiting call back. Although pt has not had any falls, due to muscle weakness and visible gait abnormality, pt is at increased risk of falls with perofrmance of household and community walking tasks and overhead ADL's and ADL's requiring transfers. Pt will benefit from skilled PT services to address impairments listed above so pt can return to PLOF and decrease risk of falls.    Personal Factors and Comorbidities Age;Comorbidity 3+;Past/Current Experience;Social Background;Time since onset of injury/illness/exacerbation    Comorbidities depression, prediabetes, hx of TIA, neurosyphilis    Examination-Activity Limitations Squat;Hygiene/Grooming;Locomotion Level;Stairs;Reach Overhead;Toileting;Transfers    Examination-Participation Restrictions Cleaning;Community Activity;Yard Work    Stability/Clinical Decision Making Unstable/Unpredictable    Surveyor, mining    Rehab Potential Good    PT Frequency 2x / week    PT Duration 8 weeks    PT Treatment/Interventions ADLs/Self Care Home Management;Biofeedback;Canalith Repostioning;Electrical Stimulation;Moist  Heat;DME Instruction;Gait training;Stair training;Functional mobility training;Therapeutic activities;Therapeutic exercise;Balance training;Neuromuscular re-education;Patient/family education;Manual techniques;Passive range of motion;Dry needling;Vestibular    PT Next Visit Plan Assess cervical AROM, VOR, screen BPPV, perform FGA.    PT Home Exercise Plan Supine L hip flexion in gravity reduced position and SLR    Consulted and Agree with Plan of Care Patient           Patient will benefit from skilled therapeutic intervention in order to improve the following deficits and impairments:  Abnormal gait,Dizziness,Pain,Decreased mobility,Decreased strength,Decreased balance,Difficulty walking  Visit Diagnosis: Abnormality of gait and mobility  Muscle weakness (generalized)  Difficulty in walking, not elsewhere classified  Neck pain     Problem List Patient Active Problem List   Diagnosis Date Noted  . Severe recurrent major depression without psychotic features ([place]) 12/13/2019  . Symptoms of depression 05/30/2019  . BPH with obstruction/lower urinary tract symptoms 06/22/2017  . Mixed hyperlipidemia 06/20/2017  . Right lateral epicondylitis 08/04/2016  . Screening for prostate cancer 08/04/2016  . Osteoarthritis of knees, bilateral 02/11/2016  . ED (erectile dysfunction) 04/20/2015  . Insomnia 04/20/2015  . Paresthesia 03/16/2015  . RLS (restless legs syndrome) 03/16/2015  . Colon cancer screening 03/16/2015    Salem Caster. Fairly IV, PT, DPT Physical Therapist- [facility]  07/02/2020, 8:40 AM  [facility] [address], [place], [address] Phone: [phone]   Fax:  [phone]  Name: ALFONZA TOFT MRN: [id number] Date of Birth: [birth year]

## 2020-07-07 ENCOUNTER — Other Ambulatory Visit: Payer: Self-pay

## 2020-07-07 ENCOUNTER — Ambulatory Visit: Payer: Medicare Other

## 2020-07-07 DIAGNOSIS — M6281 Muscle weakness (generalized): Secondary | ICD-10-CM | POA: Diagnosis not present

## 2020-07-07 DIAGNOSIS — M542 Cervicalgia: Secondary | ICD-10-CM

## 2020-07-07 DIAGNOSIS — R262 Difficulty in walking, not elsewhere classified: Secondary | ICD-10-CM

## 2020-07-07 DIAGNOSIS — R269 Unspecified abnormalities of gait and mobility: Secondary | ICD-10-CM | POA: Diagnosis not present

## 2020-07-07 DIAGNOSIS — R2689 Other abnormalities of gait and mobility: Secondary | ICD-10-CM | POA: Diagnosis not present

## 2020-07-07 NOTE — Therapy (Signed)
[facility] [address] [place], [address] Phone: [phone]   Fax:  [phone]  Physical Therapy Treatment/Assessment  Patient Details  Name: Tony Franco MRN: [id number] Date of Birth: Oct 18, 1950 Referring Provider (PT): Frazier Richards   Encounter Date: 07/07/2020   PT End of Session - 07/07/20 1813    Visit Number 2    Number of Visits 17    Date for PT Re-Evaluation 08/26/20    PT Start Time 1601    PT Stop Time 1650    PT Time Calculation (min) 49 min    Equipment Utilized During Treatment Gait belt    Activity Tolerance Patient tolerated treatment well;No increased pain    Behavior During Therapy WFL for tasks assessed/performed           Past Medical History:  Diagnosis Date  . Depression   . Stroke ([place])   . Syphilis   . TIA (transient ischemic attack)   . Vertigo     Past Surgical History:  Procedure Laterality Date  . COLONOSCOPY WITH PROPOFOL N/A 04/28/2015   Procedure: COLONOSCOPY WITH PROPOFOL;  Surgeon: Christene Lye, MD;  Location: [facility] ENDOSCOPY;  Service: Endoscopy;  Laterality: N/A;  . COLONOSCOPY WITH PROPOFOL N/A 09/09/2019   Procedure: COLONOSCOPY WITH PROPOFOL;  Surgeon: Lesly Rubenstein, MD;  Location: [facility]OSCOPY;  Service: Endoscopy;  Laterality: N/A;  . ESOPHAGOGASTRODUODENOSCOPY (EGD) WITH PROPOFOL N/A 09/09/2019   Procedure: ESOPHAGOGASTRODUODENOSCOPY (EGD) WITH PROPOFOL;  Surgeon: Lesly Rubenstein, MD;  Location: [facility] ENDOSCOPY;  Service: Endoscopy;  Laterality: N/A;  . KNEE ARTHROSCOPY     left knee    There were no vitals filed for this visit.   Subjective Assessment - 07/07/20 1603    Subjective Pt denies falls and denies darkness at night walking to and fro bathroom affecting his balance. Improved ability to flex L hip in sitting however pt reports in particular range of his L knee flexed, he is unable to flex hip against gravity to full  extent compared to RLE.    Pertinent History Pt is a 70 y.o. male referred to PT for ataxia, imbalance, headaches/dizziness and shoulder/neck pain. Pt has PMH of recent diagnosis of neurosyphilis, depression, prediabetes, history of TIA's. Intermittent crystal meth use since September 2020. Neurosyphilis diagnosis lead to gait abnormality, ataxia, and imbalance. Lumbar puncture was performed on April 1st.  Although balance, head aches, and dizziness is improving, pt still appears to be having tension head aches, vertigo and imbalance. ENT believes pt may have vestibular neuritis. Head aches located at B temporal region that is squeezing in nature with nausea, ear congestion, dizziness (room spinning), and light sensitivity. The head aches begin after episodes of dizziness.  MRI performed on 03/02/20 with no evidence of red flags. Mild, chronic microvascular ischemic changes reported. Pt reports head movements and transferring from sit to stand and stand to sit pt loses his balance with lateral listing R > L. Denies spinning of room, lightheadedness, or swimminess but at end of session does retract statement about room spinning and reports sometimes he blieves when he moves his head he feels the beginnings of the room to spin. Worse in mornings when wkaing up and when wlaking and having to head turn or change direction quickly. Pt also denies any real falls but when gait abnormalities was bad he reports running into walls constantly for support. Pt denies loss of b/b function, sensation changes or significant weight loss. Pt  reports his memory deficits are still present specifically with short term memory. Pt's goals with PT are to improve his walking and shoulder/neck pain without feeling like he has to walk into walls or falling. Pt believes his neck and shoulder tension could be related to his imbalance and wishes to work on both as able.    Limitations House hold activities;Walking;Standing    Patient Stated  Goals improve his walking and shoulder/neck pain without feeling like he has to walk into walls.    Currently in Pain? Yes    Pain Score 1     Pain Location Neck    Pain Orientation Right;Left    Pain Descriptors / Indicators Aching    Pain Onset More than a month ago    Pain Frequency Intermittent              [facility] PT Assessment - 07/07/20 1628      Functional Gait  Assessment   Gait assessed  Yes    Gait Level Surface Walks 20 ft in less than 5.5 sec, no assistive devices, good speed, no evidence for imbalance, normal gait pattern, deviates no more than 6 in outside of the 12 in walkway width.    Change in Gait Speed Able to smoothly change walking speed without loss of balance or gait deviation. Deviate no more than 6 in outside of the 12 in walkway width.    Gait with Horizontal Head Turns Performs head turns with moderate changes in gait velocity, slows down, deviates 10-15 in outside 12 in walkway width but recovers, can continue to walk.    Gait with Vertical Head Turns Performs task with slight change in gait velocity (eg, minor disruption to smooth gait path), deviates 6 - 10 in outside 12 in walkway width or uses assistive device    Gait and Pivot Turn Pivot turns safely within 3 sec and stops quickly with no loss of balance.    Step Over Obstacle Is able to step over one shoe box (4.5 in total height) without changing gait speed. No evidence of imbalance.    Gait with Narrow Base of Support Ambulates 4-7 steps.    Gait with Eyes Closed Walks 20 ft, slow speed, abnormal gait pattern, evidence for imbalance, deviates 10-15 in outside 12 in walkway width. Requires more than 9 sec to ambulate 20 ft.    Ambulating Backwards Walks 20 ft, uses assistive device, slower speed, mild gait deviations, deviates 6-10 in outside 12 in walkway width.    Steps Alternating feet, no rail.    Total Score 21          See above for FGA score  Cervical AROM (not included in billed treatment  time)  Flexion: 65 deg  Extension: 45 deg  Rotation (R/L): 45/40 deg  Lat flexion (R/L): 20 deg /25deg   BPPV TESTS:  Symptoms Duration Intensity Nystagmus  L Dix-Hallpike Subjective reports of spinning sensation < 15 sec Mild None  R Dix-Hallpike None   None  L Head Roll None   None  R Head Roll None   None  L Sidelying Test NT     R Sidelying Test NT       Canalith repositioning:   L Epley maneuver x 1 rep, no change in symptoms noted.    Head impulse Test: Positive  Completed VOR x 1, 1 x 30 seconds. Educated on proper completion with HEP to perform 3x30 sec daily.    VOR: Negative  MCTSIB:  Feet on flat level surface eyes open: No imbalance  Feet on flat level surface eyes closed. Minor AP sway   Feet on unstable surface eyes open: Minor AP sway  Feet on unstable surface eyes closed: mod AP sway.   Head turns on Airex pad: 1x30 sec. CGA.               PT Education - 07/07/20 1812    Education Details Gaze stability exercise, pt at slight risk for falls scoring 21/30 on FGA.    Person(s) Educated Patient    Methods Explanation;Demonstration;Tactile cues;Verbal cues    Comprehension Verbalized understanding;Returned demonstration            PT Short Term Goals - 07/01/20 1754      PT SHORT TERM GOAL #1   Title Pt will be indep with HEP to improve functional mobility.    Baseline 5/25: established    Time 4    Period Weeks    Status New    Target Date 07/29/20             PT Long Term Goals - 07/07/20 1814      PT LONG TERM GOAL #1   Title Pt will improve FOTO to target score to indicate clinicially significant improvement in functional mobility.    Baseline 5/25: 53/68    Time 8    Period Weeks    Status New      PT LONG TERM GOAL #2   Title Pt will improve FGA to > 25/30 to indicate clinically significant reduction in falls.    Baseline 5/31: 21/30    Time 8    Period Weeks    Status New      PT LONG TERM GOAL #3   Title Pt  will be able to perform L hip flexion to 4/5 via MMT to display clinically significant improvements in hip flexion strength.    Baseline 5/25: 2+/5 on L hip flexion.    Time 8    Period Weeks    Status New      PT LONG TERM GOAL #4   Title Pt will report no dizziness/imbalance when extending cervical spine to perform overhead ADLs and getting out of bed.    Baseline 5/25: IMbalance and beginning of room to spin when looking up    Time 8    Period Weeks    Status New      PT LONG TERM GOAL #5   Title Pt will report being able to wlak > 1 mile with no lateral listing to indicate a decreased risk of falls with community ambulation tasks.    Baseline 5/25: R lateral list with stepping strategy to correct LOb wlaking in clinic.    Time 8    Period Weeks    Status New      PT LONG TERM GOAL #6   Title Pt will improve 5xSTS to < 12 sec to indicate decreased falls risk and improvement in BLE strength.    Baseline 5/25: 13.7 sec    Time 8    Period Weeks    Status New                 Plan - 07/07/20 1816    Clinical Impression Statement First treatment session spent further assessing pt's vestibular system, cervical AROM, and dynamic balance. BPPV screened and negative B horizontal canals with horizontal roll test. DIx-Hallpike negative on R and L, pt reports minor vertigo per subjective  reports but no post upbeating, torsional nystagmus appreciated. PT initiated treatment with Epley maneuvre due to subjective reports. Subjective vertigo lasted < 15 sec during . Positive head impulse test. Negative VOR. Pt scored 21/30 on FGA indicative pt is at increased risk of falls with ambulatory tasks specifically with head turns. Modified CTSIB also assessed with increased Anterior to posterior sway on foam with eyes clsoed indicative of impairments in vestibular system. Pt will continue to benefit from skilled PT services to address vestibular and balance impairments to reduce risk of falls.     Personal Factors and Comorbidities Age;Comorbidity 3+;Past/Current Experience;Social Background;Time since onset of injury/illness/exacerbation    Comorbidities depression, prediabetes, hx of TIA, neurosyphilis    Examination-Activity Limitations Squat;Hygiene/Grooming;Locomotion Level;Stairs;Reach Overhead;Toileting;Transfers    Examination-Participation Restrictions Cleaning;Community Activity;Yard Work    Stability/Clinical Decision Making Unstable/Unpredictable    Surveyor, mining    Rehab Potential Good    PT Frequency 2x / week    PT Duration 8 weeks    PT Treatment/Interventions ADLs/Self Care Home Management;Biofeedback;Canalith Repostioning;Electrical Stimulation;Moist Heat;DME Instruction;Gait training;Stair training;Functional mobility training;Therapeutic activities;Therapeutic exercise;Balance training;Neuromuscular re-education;Patient/family education;Manual techniques;Passive range of motion;Dry needling;Vestibular    PT Next Visit Plan Gaze stabilization, dynamic balance.    PT Home Exercise Plan Supine L hip flexion in gravity reduced position and SLR    Consulted and Agree with Plan of Care Patient           Patient will benefit from skilled therapeutic intervention in order to improve the following deficits and impairments:  Abnormal gait,Dizziness,Pain,Decreased mobility,Decreased strength,Decreased balance,Difficulty walking  Visit Diagnosis: Abnormality of gait and mobility  Muscle weakness (generalized)  Difficulty in walking, not elsewhere classified  Neck pain     Problem List Patient Active Problem List   Diagnosis Date Noted  . Severe recurrent major depression without psychotic features ([place]) 12/13/2019  . Symptoms of depression 05/30/2019  . BPH with obstruction/lower urinary tract symptoms 06/22/2017  . Mixed hyperlipidemia 06/20/2017  . Right lateral epicondylitis 08/04/2016  . Screening for prostate cancer 08/04/2016  .  Osteoarthritis of knees, bilateral 02/11/2016  . ED (erectile dysfunction) 04/20/2015  . Insomnia 04/20/2015  . Paresthesia 03/16/2015  . RLS (restless legs syndrome) 03/16/2015  . Colon cancer screening 03/16/2015    Salem Caster. Fairly IV, PT, DPT Physical Therapist- [facility]  07/07/2020, 6:45 PM  [facility] [address] [place], [address] Phone: [phone]   Fax:  [phone]  Name: Tony Franco MRN: [id number] Date of Birth: 12-21-1950

## 2020-07-09 ENCOUNTER — Ambulatory Visit: Payer: Medicare Other | Attending: Internal Medicine

## 2020-07-09 ENCOUNTER — Other Ambulatory Visit: Payer: Self-pay

## 2020-07-09 DIAGNOSIS — M6281 Muscle weakness (generalized): Secondary | ICD-10-CM

## 2020-07-09 DIAGNOSIS — M542 Cervicalgia: Secondary | ICD-10-CM | POA: Diagnosis not present

## 2020-07-09 DIAGNOSIS — R2689 Other abnormalities of gait and mobility: Secondary | ICD-10-CM | POA: Insufficient documentation

## 2020-07-09 DIAGNOSIS — R269 Unspecified abnormalities of gait and mobility: Secondary | ICD-10-CM | POA: Insufficient documentation

## 2020-07-09 DIAGNOSIS — R262 Difficulty in walking, not elsewhere classified: Secondary | ICD-10-CM

## 2020-07-09 NOTE — Therapy (Signed)
[facility] [address] [place], [address] Phone: [phone]   Fax:  [phone]  Physical Therapy Treatment  Patient Details  Name: Tony Franco MRN: [id number] Date of Birth: [birth year] Referring Provider (PT): Frazier Richards   Encounter Date: 07/09/2020   PT End of Session - 07/09/20 1648    Visit Number 3    Number of Visits 17    Date for PT Re-Evaluation 08/26/20    PT Start Time 2694    PT Stop Time 1730    PT Time Calculation (min) 45 min    Equipment Utilized During Treatment Gait belt    Activity Tolerance Patient tolerated treatment well;No increased pain    Behavior During Therapy WFL for tasks assessed/performed           Past Medical History:  Diagnosis Date  . Depression   . Stroke ([place])   . Syphilis   . TIA (transient ischemic attack)   . Vertigo     Past Surgical History:  Procedure Laterality Date  . COLONOSCOPY WITH PROPOFOL N/A 04/28/2015   Procedure: COLONOSCOPY WITH PROPOFOL;  Surgeon: Christene Lye, MD;  Location: [facility];  Service: Endoscopy;  Laterality: N/A;  . COLONOSCOPY WITH PROPOFOL N/A 09/09/2019   Procedure: COLONOSCOPY WITH PROPOFOL;  Surgeon: Lesly Rubenstein, MD;  Location: [facility];  Service: Endoscopy;  Laterality: N/A;  . ESOPHAGOGASTRODUODENOSCOPY (EGD) WITH PROPOFOL N/A 09/09/2019   Procedure: ESOPHAGOGASTRODUODENOSCOPY (EGD) WITH PROPOFOL;  Surgeon: Lesly Rubenstein, MD;  Location: [facility];  Service: Endoscopy;  Laterality: N/A;  . KNEE ARTHROSCOPY     left knee    There were no vitals filed for this visit.   Subjective Assessment - 07/09/20 1646    Subjective Pt reports neck pain, but denies falls. Has performed gait stabilization exercises with improvement in symptoms.    Pertinent History Pt is a 70 y.o. male referred to PT for ataxia, imbalance, headaches/dizziness and shoulder/neck pain. Pt has PMH of recent  diagnosis of neurosyphilis, depression, prediabetes, history of TIA's. Intermittent crystal meth use since September 2020. Neurosyphilis diagnosis lead to gait abnormality, ataxia, and imbalance. Lumbar puncture was performed on April 1st.  Although balance, head aches, and dizziness is improving, pt still appears to be having tension head aches, vertigo and imbalance. ENT believes pt may have vestibular neuritis. Head aches located at B temporal region that is squeezing in nature with nausea, ear congestion, dizziness (room spinning), and light sensitivity. The head aches begin after episodes of dizziness.  MRI performed on 03/02/20 with no evidence of red flags. Mild, chronic microvascular ischemic changes reported. Pt reports head movements and transferring from sit to stand and stand to sit pt loses his balance with lateral listing R > L. Denies spinning of room, lightheadedness, or swimminess but at end of session does retract statement about room spinning and reports sometimes he blieves when he moves his head he feels the beginnings of the room to spin. Worse in mornings when wkaing up and when wlaking and having to head turn or change direction quickly. Pt also denies any real falls but when gait abnormalities was bad he reports running into walls constantly for support. Pt denies loss of b/b function, sensation changes or significant weight loss. Pt reports his memory deficits are still present specifically with short term memory. Pt's goals with PT are to improve his walking and shoulder/neck pain without feeling like he has to walk into walls  or falling. Pt believes his neck and shoulder tension could be related to his imbalance and wishes to work on both as able.    Limitations House hold activities;Walking;Standing    Patient Stated Goals improve his walking and shoulder/neck pain without feeling like he has to walk into walls.    Currently in Pain? Yes    Pain Score 1     Pain Location Neck     Pain Orientation Right;Left    Pain Descriptors / Indicators Aching    Pain Onset More than a month ago    Pain Frequency Intermittent          There.ex:   Pt displays difficulty while in sitting with L hip flexion when sitting upright with lumbar lordosis. Hardly able to elevate foot off of ground.  Supine LLE hamstring stretches: 3x30 sec.  Side lying L hip flexion: 2x12 Reassessment of L hip flexion post stretches with upright posture with lumbar lordosis: Able to increase ROM and raise L foot off of ground after hamstring mobility and side lying exercise.     Neuro Re-ED:   Gaze stabilization with blank background and X on sticky note: 3x30 sec  Gait with head turns Horizontal and vertical: 10 meters. 6x/direction. CGA. Lat sway Airex pad horizontal and vertical head turns: x10/direction. CGA Eyes closed on airex pad: 3x30 sec bouts. CGA with post sway that pt can correct independently.    PT Education - 07/09/20 1648    Education Details form/technique with exercise.    Person(s) Educated Patient    Methods Explanation;Demonstration;Tactile cues;Verbal cues    Comprehension Verbalized understanding;Returned demonstration            PT Short Term Goals - 07/01/20 1754      PT SHORT TERM GOAL #1   Title Pt will be indep with HEP to improve functional mobility.    Baseline 5/25: established    Time 4    Period Weeks    Status New    Target Date 07/29/20             PT Long Term Goals - 07/07/20 1814      PT LONG TERM GOAL #1   Title Pt will improve FOTO to target score to indicate clinicially significant improvement in functional mobility.    Baseline 5/25: 53/68    Time 8    Period Weeks    Status New      PT LONG TERM GOAL #2   Title Pt will improve FGA to > 25/30 to indicate clinically significant reduction in falls.    Baseline 5/31: 21/30    Time 8    Period Weeks    Status New      PT LONG TERM GOAL #3   Title Pt will be able to perform L hip  flexion to 4/5 via MMT to display clinically significant improvements in hip flexion strength.    Baseline 5/25: 2+/5 on L hip flexion.    Time 8    Period Weeks    Status New      PT LONG TERM GOAL #4   Title Pt will report no dizziness/imbalance when extending cervical spine to perform overhead ADLs and getting out of bed.    Baseline 5/25: IMbalance and beginning of room to spin when looking up    Time 8    Period Weeks    Status New      PT LONG TERM GOAL #5   Title Pt will  report being able to wlak > 1 mile with no lateral listing to indicate a decreased risk of falls with community ambulation tasks.    Baseline 5/25: R lateral list with stepping strategy to correct LOb wlaking in clinic.    Time 8    Period Weeks    Status New      PT LONG TERM GOAL #6   Title Pt will improve 5xSTS to < 12 sec to indicate decreased falls risk and improvement in BLE strength.    Baseline 5/25: 13.7 sec    Time 8    Period Weeks    Status New                 Plan - 07/09/20 1733    Clinical Impression Statement Pt still displaying imbalance with dynamic gait with added horizontal and vertical head turns. Pt displays post sway as well with standing on airex pad with eyes closed to further challenge vestibular system. Pt will continue to benefit from skilled PT services to address imbalance to decrease risk of falls and improve functional capacity for leisure tasks and ADL's.    Personal Factors and Comorbidities Age;Comorbidity 3+;Past/Current Experience;Social Background;Time since onset of injury/illness/exacerbation    Comorbidities depression, prediabetes, hx of TIA, neurosyphilis    Examination-Activity Limitations Squat;Hygiene/Grooming;Locomotion Level;Stairs;Reach Overhead;Toileting;Transfers    Examination-Participation Restrictions Cleaning;Community Activity;Yard Work    Stability/Clinical Decision Making Unstable/Unpredictable    Rehab Potential Good    PT Frequency 2x /  week    PT Duration 8 weeks    PT Treatment/Interventions ADLs/Self Care Home Management;Biofeedback;Canalith Repostioning;Electrical Stimulation;Moist Heat;DME Instruction;Gait training;Stair training;Functional mobility training;Therapeutic activities;Therapeutic exercise;Balance training;Neuromuscular re-education;Patient/family education;Manual techniques;Passive range of motion;Dry needling;Vestibular    PT Next Visit Plan Gaze stabilization, dynamic balance for vestibular system    PT Home Exercise Plan Supine L hip flexion in gravity reduced position and SLR    Consulted and Agree with Plan of Care Patient           Patient will benefit from skilled therapeutic intervention in order to improve the following deficits and impairments:  Abnormal gait,Dizziness,Pain,Decreased mobility,Decreased strength,Decreased balance,Difficulty walking  Visit Diagnosis: Abnormality of gait and mobility  Muscle weakness (generalized)  Difficulty in walking, not elsewhere classified  Neck pain     Problem List Patient Active Problem List   Diagnosis Date Noted  . Severe recurrent major depression without psychotic features ([place]) 12/13/2019  . Symptoms of depression 05/30/2019  . BPH with obstruction/lower urinary tract symptoms 06/22/2017  . Mixed hyperlipidemia 06/20/2017  . Right lateral epicondylitis 08/04/2016  . Screening for prostate cancer 08/04/2016  . Osteoarthritis of knees, bilateral 02/11/2016  . ED (erectile dysfunction) 04/20/2015  . Insomnia 04/20/2015  . Paresthesia 03/16/2015  . RLS (restless legs syndrome) 03/16/2015  . Colon cancer screening 03/16/2015    Salem Caster. Fairly IV, PT, DPT Physical Therapist- [facility]  07/09/2020, 5:39 PM  [facility] [address], [place], [address] Phone: [phone]   Fax:  [phone]  Name: ORBIN MAYEUX MRN:  [id number] Date of Birth: 05/30/1950

## 2020-07-14 ENCOUNTER — Other Ambulatory Visit: Payer: Self-pay

## 2020-07-14 ENCOUNTER — Ambulatory Visit: Payer: Medicare Other

## 2020-07-14 DIAGNOSIS — M542 Cervicalgia: Secondary | ICD-10-CM | POA: Diagnosis not present

## 2020-07-14 DIAGNOSIS — R269 Unspecified abnormalities of gait and mobility: Secondary | ICD-10-CM | POA: Diagnosis not present

## 2020-07-14 DIAGNOSIS — R2689 Other abnormalities of gait and mobility: Secondary | ICD-10-CM | POA: Diagnosis not present

## 2020-07-14 DIAGNOSIS — R262 Difficulty in walking, not elsewhere classified: Secondary | ICD-10-CM | POA: Diagnosis not present

## 2020-07-14 DIAGNOSIS — M6281 Muscle weakness (generalized): Secondary | ICD-10-CM

## 2020-07-14 DIAGNOSIS — F321 Major depressive disorder, single episode, moderate: Secondary | ICD-10-CM | POA: Diagnosis not present

## 2020-07-14 NOTE — Therapy (Addendum)
[facility] [address] [place], [address] Phone: [phone]   Fax:  [phone]  Physical Therapy Treatment  Patient Details  Name: Tony Franco MRN: [id number] Date of Birth: 1950/11/15 Referring Provider (PT): Frazier Richards   Encounter Date: 07/14/2020     Past Medical History:  Diagnosis Date   Depression    Stroke [facility])    Syphilis    TIA (transient ischemic attack)    Vertigo     Past Surgical History:  Procedure Laterality Date   COLONOSCOPY WITH PROPOFOL N/A 04/28/2015   Procedure: COLONOSCOPY WITH PROPOFOL;  Surgeon: Christene Lye, MD;  Location: [facility];  Service: Endoscopy;  Laterality: N/A;   COLONOSCOPY WITH PROPOFOL N/A 09/09/2019   Procedure: COLONOSCOPY WITH PROPOFOL;  Surgeon: Lesly Rubenstein, MD;  Location: [facility];  Service: Endoscopy;  Laterality: N/A;   ESOPHAGOGASTRODUODENOSCOPY (EGD) WITH PROPOFOL N/A 09/09/2019   Procedure: ESOPHAGOGASTRODUODENOSCOPY (EGD) WITH PROPOFOL;  Surgeon: Lesly Rubenstein, MD;  Location: [facility];  Service: Endoscopy;  Laterality: N/A;   KNEE ARTHROSCOPY     left knee    There were no vitals filed for this visit.     Neuro Re-ED:   10 m x 4 Horizontal Head Turns CGA              10 m x 6 Vertical Head Turns CGA               10 m x  4 Tandem Walk CGA               5 ft x 6 Side Steps on Airex Pad CGA               5 fts x 6 Tandem on Airex Pad CGA               8 cone tight weaves or zig zags x 4. CGA              Obstacle course (Cone taps, mat walk, and 4 inch step) x 2                Obstacle course (Cone taps,uneven mat, and 6 inch step)             Alternating cone taps 20 ft x 2   Seated marches with mirror as visual feedback to improve L hip flexion motor control. Increased difficulty with increased lumbar lordosis aka more upright posture. RLE x5, then LLE x5. Noted increased difficulty on LLE with  coordinating muscle/mind coordination.         PT Short Term Goals - 07/01/20 1754       PT SHORT TERM GOAL #1   Title Pt will be indep with HEP to improve functional mobility.    Baseline 5/25: established    Time 4    Period Weeks    Status New    Target Date 07/29/20               PT Long Term Goals - 07/07/20 1814       PT LONG TERM GOAL #1   Title Pt will improve FOTO to target score to indicate clinicially significant improvement in functional mobility.    Baseline 5/25: 53/68    Time 8    Period Weeks    Status New      PT LONG TERM GOAL #2   Title Pt will improve FGA to >  25/30 to indicate clinically significant reduction in falls.    Baseline 5/31: 21/30    Time 8    Period Weeks    Status New      PT LONG TERM GOAL #3   Title Pt will be able to perform L hip flexion to 4/5 via MMT to display clinically significant improvements in hip flexion strength.    Baseline 5/25: 2+/5 on L hip flexion.    Time 8    Period Weeks    Status New      PT LONG TERM GOAL #4   Title Pt will report no dizziness/imbalance when extending cervical spine to perform overhead ADLs and getting out of bed.    Baseline 5/25: IMbalance and beginning of room to spin when looking up    Time 8    Period Weeks    Status New      PT LONG TERM GOAL #5   Title Pt will report being able to wlak > 1 mile with no lateral listing to indicate a decreased risk of falls with community ambulation tasks.    Baseline 5/25: R lateral list with stepping strategy to correct LOb wlaking in clinic.    Time 8    Period Weeks    Status New      PT LONG TERM GOAL #6   Title Pt will improve 5xSTS to < 12 sec to indicate decreased falls risk and improvement in BLE strength.    Baseline 5/25: 13.7 sec    Time 8    Period Weeks    Status New                     Patient will benefit from skilled therapeutic intervention in order to improve the following deficits and impairments:   Abnormal gait, Dizziness, Pain, Decreased mobility, Decreased strength, Decreased balance, Difficulty walking  Visit Diagnosis: Abnormality of gait and mobility  Muscle weakness (generalized)  Difficulty in walking, not elsewhere classified  Neck pain     Problem List Patient Active Problem List   Diagnosis Date Noted   Severe recurrent major depression without psychotic features ([place]) 12/13/2019   Symptoms of depression 05/30/2019   BPH with obstruction/lower urinary tract symptoms 06/22/2017   Mixed hyperlipidemia 06/20/2017   Right lateral epicondylitis 08/04/2016   Screening for prostate cancer 08/04/2016   Osteoarthritis of knees, bilateral 02/11/2016   ED (erectile dysfunction) 04/20/2015   Insomnia 04/20/2015   Paresthesia 03/16/2015   RLS (restless legs syndrome) 03/16/2015   Colon cancer screening 03/16/2015    Salem Caster. Fairly IV, PT, DPT Physical Therapist- [facility]  07/16/2020, 6:32 PM  [facility] [address], [place], [address] Phone: [phone]   Fax:  [phone]  Name: Tony Franco MRN: [id number] Date of Birth: 1950/09/22

## 2020-07-16 ENCOUNTER — Other Ambulatory Visit: Payer: Self-pay

## 2020-07-16 ENCOUNTER — Ambulatory Visit: Payer: Medicare Other

## 2020-07-16 DIAGNOSIS — R262 Difficulty in walking, not elsewhere classified: Secondary | ICD-10-CM | POA: Diagnosis not present

## 2020-07-16 DIAGNOSIS — M542 Cervicalgia: Secondary | ICD-10-CM

## 2020-07-16 DIAGNOSIS — R269 Unspecified abnormalities of gait and mobility: Secondary | ICD-10-CM | POA: Diagnosis not present

## 2020-07-16 DIAGNOSIS — M6281 Muscle weakness (generalized): Secondary | ICD-10-CM | POA: Diagnosis not present

## 2020-07-16 DIAGNOSIS — R2689 Other abnormalities of gait and mobility: Secondary | ICD-10-CM | POA: Diagnosis not present

## 2020-07-16 NOTE — Therapy (Signed)
[facility] [address], [place], [address] Phone: [phone]   Fax:  [phone]  Physical Therapy Treatment  Patient Details  Name: Tony Franco MRN: [id number] Date of Birth: 1950/03/07 Referring Provider (PT): Frazier Richards   Encounter Date: 07/16/2020   PT End of Session - 07/16/20 1813     Visit Number 5    Number of Visits 17    Date for PT Re-Evaluation 08/26/20    PT Start Time 5993    PT Stop Time 1600    PT Time Calculation (min) 43 min    Equipment Utilized During Treatment Gait belt    Activity Tolerance Patient tolerated treatment well;No increased pain;Patient limited by fatigue    Behavior During Therapy [facility] for tasks assessed/performed             Past Medical History:  Diagnosis Date   Depression    Stroke [facility])    Syphilis    TIA (transient ischemic attack)    Vertigo     Past Surgical History:  Procedure Laterality Date   COLONOSCOPY WITH PROPOFOL N/A 04/28/2015   Procedure: COLONOSCOPY WITH PROPOFOL;  Surgeon: Christene Lye, MD;  Location: [facility];  Service: Endoscopy;  Laterality: N/A;   COLONOSCOPY WITH PROPOFOL N/A 09/09/2019   Procedure: COLONOSCOPY WITH PROPOFOL;  Surgeon: Lesly Rubenstein, MD;  Location: [facility];  Service: Endoscopy;  Laterality: N/A;   ESOPHAGOGASTRODUODENOSCOPY (EGD) WITH PROPOFOL N/A 09/09/2019   Procedure: ESOPHAGOGASTRODUODENOSCOPY (EGD) WITH PROPOFOL;  Surgeon: Lesly Rubenstein, MD;  Location: [facility];  Service: Endoscopy;  Laterality: N/A;   KNEE ARTHROSCOPY     left knee    There were no vitals filed for this visit.   Subjective Assessment - 07/16/20 1811     Subjective Pt denies falls. Reports that he has some days where he has significant fatigue since Neurosyphilis diagnosis which he is experiencing today.    Pertinent History Pt is a 70 y.o. male referred to PT for ataxia, imbalance,  headaches/dizziness and shoulder/neck pain. Pt has PMH of recent diagnosis of neurosyphilis, depression, prediabetes, history of TIA's. Intermittent crystal meth use since September 2020. Neurosyphilis diagnosis lead to gait abnormality, ataxia, and imbalance. Lumbar puncture was performed on April 1st.  Although balance, head aches, and dizziness is improving, pt still appears to be having tension head aches, vertigo and imbalance. ENT believes pt may have vestibular neuritis. Head aches located at B temporal region that is squeezing in nature with nausea, ear congestion, dizziness (room spinning), and light sensitivity. The head aches begin after episodes of dizziness.  MRI performed on 03/02/20 with no evidence of red flags. Mild, chronic microvascular ischemic changes reported. Pt reports head movements and transferring from sit to stand and stand to sit pt loses his balance with lateral listing R > L. Denies spinning of room, lightheadedness, or swimminess but at end of session does retract statement about room spinning and reports sometimes he blieves when he moves his head he feels the beginnings of the room to spin. Worse in mornings when wkaing up and when wlaking and having to head turn or change direction quickly. Pt also denies any real falls but when gait abnormalities was bad he reports running into walls constantly for support. Pt denies loss of b/b function, sensation changes or significant weight loss. Pt reports his memory deficits are still present specifically with short term memory. Pt's goals with PT are to improve his  walking and shoulder/neck pain without feeling like he has to walk into walls or falling. Pt believes his neck and shoulder tension could be related to his imbalance and wishes to work on both as able.    Limitations House hold activities;Walking;Standing    Patient Stated Goals improve his walking and shoulder/neck pain without feeling like he has to walk into walls.     Currently in Pain? Yes    Pain Score 1     Pain Location Neck    Pain Orientation Right;Left    Pain Onset More than a month ago             Neuro Re-Ed:   Seated gaze stabilization VORx2: 3x30 sec. Initial PT demo and VC's for correct completion. Initial eye lag with L cervical rotation and eye movements to R that corrects as pt progresses through exercise. Pt denies imbalance or vertigo symptoms.   Standing exercise on airex pad    Romberg with horizontal/vertical head turns: 3x30sec/head turn direction. CGA. Noted hip and ankle strategy to correct with lateral sway and intermittent need for UE support to return upright. Able to correct independently.   SLS: 3x30 sec/LE. SBA   Ambulation cone weaves: x6 laps. CGA   Backwards walking: 4x10 m. CGA   Alternating forwards/backwards walking for quick changes in direction: 2x35m. CGA   Romberg 2 KG med ball rebound toss: 2x30sec/foot under BOS. CGA     PT Education - 07/16/20 1812     Education Details form/technique with exercise. Contact referring provider on fatigue spells.    Person(s) Educated Patient    Methods Explanation;Demonstration    Comprehension Verbalized understanding;Returned demonstration              PT Short Term Goals - 07/01/20 1754       PT SHORT TERM GOAL #1   Title Pt will be indep with HEP to improve functional mobility.    Baseline 5/25: established    Time 4    Period Weeks    Status New    Target Date 07/29/20               PT Long Term Goals - 07/07/20 1814       PT LONG TERM GOAL #1   Title Pt will improve FOTO to target score to indicate clinicially significant improvement in functional mobility.    Baseline 5/25: 53/68    Time 8    Period Weeks    Status New      PT LONG TERM GOAL #2   Title Pt will improve FGA to > 25/30 to indicate clinically significant reduction in falls.    Baseline 5/31: 21/30    Time 8    Period Weeks    Status New      PT LONG TERM GOAL #3    Title Pt will be able to perform L hip flexion to 4/5 via MMT to display clinically significant improvements in hip flexion strength.    Baseline 5/25: 2+/5 on L hip flexion.    Time 8    Period Weeks    Status New      PT LONG TERM GOAL #4   Title Pt will report no dizziness/imbalance when extending cervical spine to perform overhead ADLs and getting out of bed.    Baseline 5/25: IMbalance and beginning of room to spin when looking up    Time 8    Period Weeks    Status New  PT LONG TERM GOAL #5   Title Pt will report being able to wlak > 1 mile with no lateral listing to indicate a decreased risk of falls with community ambulation tasks.    Baseline 5/25: R lateral list with stepping strategy to correct LOb wlaking in clinic.    Time 8    Period Weeks    Status New      PT LONG TERM GOAL #6   Title Pt will improve 5xSTS to < 12 sec to indicate decreased falls risk and improvement in BLE strength.    Baseline 5/25: 13.7 sec    Time 8    Period Weeks    Status New                   Plan - 07/16/20 1813     Clinical Impression Statement Pt progressed with gaze stabilization VORx2 and dynamic balance with reduced BOS and on unstable surface with addition of head turns. Initial eye lag noted with VOR x2 with head turn left and eye movements to the right that normalized with continuation. Pt doing well with balance but displayed most sway with romberg stance on foam with horizontal and vetical head turns. Pt appears lethargic today requiring extra rest breaks. PT educated pt to contact referring provider on onset of significant fatigue reports since neurosyphilis diagnosis. Pt verbalized understanding. Pt will continue to benefit from skilled PT services to address imbalance so pt can return to PLOF.    Personal Factors and Comorbidities Age;Comorbidity 3+;Past/Current Experience;Social Background;Time since onset of injury/illness/exacerbation    Comorbidities depression,  prediabetes, hx of TIA, neurosyphilis    Examination-Activity Limitations Squat;Hygiene/Grooming;Locomotion Level;Stairs;Reach Overhead;Toileting;Transfers    Examination-Participation Restrictions Cleaning;Community Activity;Yard Work    Stability/Clinical Decision Making Unstable/Unpredictable    Rehab Potential Good    PT Frequency 2x / week    PT Duration 8 weeks    PT Treatment/Interventions ADLs/Self Care Home Management;Biofeedback;Canalith Repostioning;Electrical Stimulation;Moist Heat;DME Instruction;Gait training;Stair training;Functional mobility training;Therapeutic activities;Therapeutic exercise;Balance training;Neuromuscular re-education;Patient/family education;Manual techniques;Passive range of motion;Dry needling;Vestibular    PT Next Visit Plan Gaze stabilization VOR x2, dynamic balance for vestibular system    PT Home Exercise Plan Supine L hip flexion in gravity reduced position and SLR    Consulted and Agree with Plan of Care Patient             Patient will benefit from skilled therapeutic intervention in order to improve the following deficits and impairments:  Abnormal gait, Dizziness, Pain, Decreased mobility, Decreased strength, Decreased balance, Difficulty walking  Visit Diagnosis: Abnormality of gait and mobility  Muscle weakness (generalized)  Neck pain  Difficulty in walking, not elsewhere classified     Problem List Patient Active Problem List   Diagnosis Date Noted   Severe recurrent major depression without psychotic features ([place]) 12/13/2019   Symptoms of depression 05/30/2019   BPH with obstruction/lower urinary tract symptoms 06/22/2017   Mixed hyperlipidemia 06/20/2017   Right lateral epicondylitis 08/04/2016   Screening for prostate cancer 08/04/2016   Osteoarthritis of knees, bilateral 02/11/2016   ED (erectile dysfunction) 04/20/2015   Insomnia 04/20/2015   Paresthesia 03/16/2015   RLS (restless legs syndrome) 03/16/2015   Colon  cancer screening 03/16/2015    Salem Caster. Fairly IV, PT, DPT Physical Therapist- [facility]  07/16/2020, 6:19 PM  [facility] [address], [place], [address] Phone: [phone]   Fax:  [phone]  Name: Tony Franco MRN: [id number] Date of Birth: Aug 29, 1950

## 2020-07-21 ENCOUNTER — Ambulatory Visit: Payer: Medicare Other | Admitting: Physical Therapy

## 2020-07-21 ENCOUNTER — Encounter: Payer: Self-pay | Admitting: Physical Therapy

## 2020-07-21 ENCOUNTER — Other Ambulatory Visit: Payer: Self-pay

## 2020-07-21 VITALS — BP 126/83 | HR 69

## 2020-07-21 DIAGNOSIS — F321 Major depressive disorder, single episode, moderate: Secondary | ICD-10-CM | POA: Diagnosis not present

## 2020-07-21 DIAGNOSIS — M542 Cervicalgia: Secondary | ICD-10-CM

## 2020-07-21 DIAGNOSIS — R2689 Other abnormalities of gait and mobility: Secondary | ICD-10-CM | POA: Diagnosis not present

## 2020-07-21 DIAGNOSIS — R269 Unspecified abnormalities of gait and mobility: Secondary | ICD-10-CM | POA: Diagnosis not present

## 2020-07-21 DIAGNOSIS — M6281 Muscle weakness (generalized): Secondary | ICD-10-CM

## 2020-07-21 DIAGNOSIS — R262 Difficulty in walking, not elsewhere classified: Secondary | ICD-10-CM | POA: Diagnosis not present

## 2020-07-21 NOTE — Therapy (Signed)
[facility] [address] [place], [address] Phone: [phone]   Fax:  [phone]  Physical Therapy Treatment  Patient Details  Name: Tony Franco MRN: [id number] Date of Birth: Nov 08, 1950 Referring Provider (PT): Frazier Richards   Encounter Date: 07/21/2020   PT End of Session - 07/21/20 1244     Visit Number 6    Number of Visits 17    Date for PT Re-Evaluation 08/26/20    PT Start Time 8676    PT Stop Time 1230    PT Time Calculation (min) 45 min    Equipment Utilized During Treatment Gait belt    Activity Tolerance Patient tolerated treatment well;No increased pain    Behavior During Therapy WFL for tasks assessed/performed             Past Medical History:  Diagnosis Date   Depression    Stroke [facility])    Syphilis    TIA (transient ischemic attack)    Vertigo     Past Surgical History:  Procedure Laterality Date   COLONOSCOPY WITH PROPOFOL N/A 04/28/2015   Procedure: COLONOSCOPY WITH PROPOFOL;  Surgeon: Christene Lye, MD;  Location: [facility] ENDOSCOPY;  Service: Endoscopy;  Laterality: N/A;   COLONOSCOPY WITH PROPOFOL N/A 09/09/2019   Procedure: COLONOSCOPY WITH PROPOFOL;  Surgeon: Lesly Rubenstein, MD;  Location: [facility] ENDOSCOPY;  Service: Endoscopy;  Laterality: N/A;   ESOPHAGOGASTRODUODENOSCOPY (EGD) WITH PROPOFOL N/A 09/09/2019   Procedure: ESOPHAGOGASTRODUODENOSCOPY (EGD) WITH PROPOFOL;  Surgeon: Lesly Rubenstein, MD;  Location: [facility] ENDOSCOPY;  Service: Endoscopy;  Laterality: N/A;   KNEE ARTHROSCOPY     left knee    Vitals:   07/21/20 1149  BP: 126/83  Pulse: 69     Subjective Assessment - 07/21/20 1150     Subjective Pt reports that he feels like his disorientation is improving overall. He does feel out of balance from moment to moment, but less frequent and less extreme.    Pertinent History Pt is a 70 y.o. male referred to PT for ataxia, imbalance,  headaches/dizziness and shoulder/neck pain. Pt has PMH of recent diagnosis of neurosyphilis, depression, prediabetes, history of TIA's. Intermittent crystal meth use since September 2020. Neurosyphilis diagnosis lead to gait abnormality, ataxia, and imbalance. Lumbar puncture was performed on April 1st.  Although balance, head aches, and dizziness is improving, pt still appears to be having tension head aches, vertigo and imbalance. ENT believes pt may have vestibular neuritis. Head aches located at B temporal region that is squeezing in nature with nausea, ear congestion, dizziness (room spinning), and light sensitivity. The head aches begin after episodes of dizziness.  MRI performed on 03/02/20 with no evidence of red flags. Mild, chronic microvascular ischemic changes reported. Pt reports head movements and transferring from sit to stand and stand to sit pt loses his balance with lateral listing R > L. Denies spinning of room, lightheadedness, or swimminess but at end of session does retract statement about room spinning and reports sometimes he blieves when he moves his head he feels the beginnings of the room to spin. Worse in mornings when wkaing up and when wlaking and having to head turn or change direction quickly. Pt also denies any real falls but when gait abnormalities was bad he reports running into walls constantly for support. Pt denies loss of b/b function, sensation changes or significant weight loss. Pt reports his memory deficits are still present specifically with short term memory. Pt's  goals with PT are to improve his walking and shoulder/neck pain without feeling like he has to walk into walls or falling. Pt believes his neck and shoulder tension could be related to his imbalance and wishes to work on both as able.    Limitations House hold activities;Walking;Standing    Patient Stated Goals improve his walking and shoulder/neck pain without feeling like he has to walk into walls.    Pain  Onset More than a month ago            NMR:  Programmer, applications   Romberg Eyes Closed Horizontal Head Turns 1x 15  Tandem Horizontal Head Turns L + R 2 x 15  Tandem Vertical Head Turns 1x15    -Hip Sway with right foot forward  Tandem Eyes Closed R + L alternating 2 x 30 sec    Dynamic Gait   Walking with head turns 7 x 30 ft  -Vcs to increase pace   Walking with vertical head turns 7 x 30 ft  -Vcs to increase pace   Walking with change in turns 10 ft to 50 ft  x  1  Walking with turns on verbal cues and varying distances x 10      PT Short Term Goals - 07/01/20 1754       PT SHORT TERM GOAL #1   Title Pt will be indep with HEP to improve functional mobility.    Baseline 5/25: established    Time 4    Period Weeks    Status New    Target Date 07/29/20               PT Long Term Goals - 07/07/20 1814       PT LONG TERM GOAL #1   Title Pt will improve FOTO to target score to indicate clinicially significant improvement in functional mobility.    Baseline 5/25: 53/68    Time 8    Period Weeks    Status New      PT LONG TERM GOAL #2   Title Pt will improve FGA to > 25/30 to indicate clinically significant reduction in falls.    Baseline 5/31: 21/30    Time 8    Period Weeks    Status New      PT LONG TERM GOAL #3   Title Pt will be able to perform L hip flexion to 4/5 via MMT to display clinically significant improvements in hip flexion strength.    Baseline 5/25: 2+/5 on L hip flexion.    Time 8    Period Weeks    Status New      PT LONG TERM GOAL #4   Title Pt will report no dizziness/imbalance when extending cervical spine to perform overhead ADLs and getting out of bed.    Baseline 5/25: IMbalance and beginning of room to spin when looking up    Time 8    Period Weeks    Status New      PT LONG TERM GOAL #5   Title Pt will report being able to wlak > 1 mile with no lateral listing to indicate a decreased risk of falls with community  ambulation tasks.    Baseline 5/25: R lateral list with stepping strategy to correct LOb wlaking in clinic.    Time 8    Period Weeks    Status New      PT LONG TERM GOAL #6   Title Pt will improve  5xSTS to < 12 sec to indicate decreased falls risk and improvement in BLE strength.    Baseline 5/25: 13.7 sec    Time 8    Period Weeks    Status New                   Plan - 07/21/20 1314     Clinical Impression Statement Pt progressed with static balance exercises with ability to perform semi-tandem stance activities with eyes closed and horizontal and vertical head turns with limited hip sway. He also demonstrates improvement with dynamic gait activity with horizontal and vertical head turns with less episodes of loss of balance.  While pt describes having less frequent episodes of loss of balance, he is still occasionally experiencing dizziness when changing his head position and loss of balance when walking dog over varied terrain. Pt will continue to benefit from PT to further assess vestibular deficits, and his steadiness with his gait.    Personal Factors and Comorbidities Age;Comorbidity 3+;Past/Current Experience;Social Background;Time since onset of injury/illness/exacerbation    Comorbidities depression, prediabetes, hx of TIA, neurosyphilis    Examination-Activity Limitations Squat;Hygiene/Grooming;Locomotion Level;Stairs;Reach Overhead;Toileting;Transfers    Examination-Participation Restrictions Cleaning;Community Activity;Yard Work    Stability/Clinical Decision Making Unstable/Unpredictable    Rehab Potential Good    PT Frequency 2x / week    PT Duration 8 weeks    PT Treatment/Interventions ADLs/Self Care Home Management;Biofeedback;Canalith Repostioning;Electrical Stimulation;Moist Heat;DME Instruction;Gait training;Stair training;Functional mobility training;Therapeutic activities;Therapeutic exercise;Balance training;Neuromuscular re-education;Patient/family  education;Manual techniques;Passive range of motion;Dry needling;Vestibular    PT Next Visit Plan dynamic gait activities with changing terrain, further assessment of vestibular system    PT Home Exercise Plan Corner Static Semitandem EC and EC with horizontal and vertical head turns, walking with horizontal and vertical head turns over varied terrian and further assessment of vestibular system.    Consulted and Agree with Plan of Care Patient             Patient will benefit from skilled therapeutic intervention in order to improve the following deficits and impairments:  Abnormal gait, Dizziness, Pain, Decreased mobility, Decreased strength, Decreased balance, Difficulty walking  Visit Diagnosis: Other abnormalities of gait and mobility  Muscle weakness (generalized)  Neck pain  Difficulty in walking, not elsewhere classified     Problem List Patient Active Problem List   Diagnosis Date Noted   Severe recurrent major depression without psychotic features ([place]) 12/13/2019   Symptoms of depression 05/30/2019   BPH with obstruction/lower urinary tract symptoms 06/22/2017   Mixed hyperlipidemia 06/20/2017   Right lateral epicondylitis 08/04/2016   Screening for prostate cancer 08/04/2016   Osteoarthritis of knees, bilateral 02/11/2016   ED (erectile dysfunction) 04/20/2015   Insomnia 04/20/2015   Paresthesia 03/16/2015   RLS (restless legs syndrome) 03/16/2015   Colon cancer screening 03/16/2015    Daneil Dan 07/21/2020, 1:34 PM  [facility] [address] [place], [address] Phone: [phone]   Fax:  [phone]  Name: KROSBY RITCHIE MRN: [id number] Date of Birth: 1950/06/15

## 2020-07-23 ENCOUNTER — Ambulatory Visit: Payer: Medicare Other | Admitting: Physical Therapy

## 2020-07-23 ENCOUNTER — Other Ambulatory Visit: Payer: Self-pay

## 2020-07-23 ENCOUNTER — Encounter: Payer: Self-pay | Admitting: Physical Therapy

## 2020-07-23 DIAGNOSIS — R262 Difficulty in walking, not elsewhere classified: Secondary | ICD-10-CM

## 2020-07-23 DIAGNOSIS — R269 Unspecified abnormalities of gait and mobility: Secondary | ICD-10-CM

## 2020-07-23 DIAGNOSIS — M542 Cervicalgia: Secondary | ICD-10-CM | POA: Diagnosis not present

## 2020-07-23 DIAGNOSIS — M6281 Muscle weakness (generalized): Secondary | ICD-10-CM | POA: Diagnosis not present

## 2020-07-23 DIAGNOSIS — R2689 Other abnormalities of gait and mobility: Secondary | ICD-10-CM | POA: Diagnosis not present

## 2020-07-23 NOTE — Therapy (Signed)
[facility] [address], [place], [address] Phone: [phone]   Fax:  [phone]  Physical Therapy Treatment  Patient Details  Name: Tony Franco MRN: [id number] Date of Birth: [birth year] Referring Provider (PT): Frazier Richards   Encounter Date: 07/23/2020   PT End of Session - 07/23/20 1310     Visit Number 7    Number of Visits 17    Date for PT Re-Evaluation 08/26/20    PT Start Time 0092    PT Stop Time 1230    PT Time Calculation (min) 45 min    Equipment Utilized During Treatment Gait belt    Activity Tolerance Patient tolerated treatment well;No increased pain    Behavior During Therapy WFL for tasks assessed/performed             Past Medical History:  Diagnosis Date   Depression    Stroke [facility])    Syphilis    TIA (transient ischemic attack)    Vertigo     Past Surgical History:  Procedure Laterality Date   COLONOSCOPY WITH PROPOFOL N/A 04/28/2015   Procedure: COLONOSCOPY WITH PROPOFOL;  Surgeon: Christene Lye, MD;  Location: [facility] ENDOSCOPY;  Service: Endoscopy;  Laterality: N/A;   COLONOSCOPY WITH PROPOFOL N/A 09/09/2019   Procedure: COLONOSCOPY WITH PROPOFOL;  Surgeon: Lesly Rubenstein, MD;  Location: [facility] ENDOSCOPY;  Service: Endoscopy;  Laterality: N/A;   ESOPHAGOGASTRODUODENOSCOPY (EGD) WITH PROPOFOL N/A 09/09/2019   Procedure: ESOPHAGOGASTRODUODENOSCOPY (EGD) WITH PROPOFOL;  Surgeon: Lesly Rubenstein, MD;  Location: [facility] ENDOSCOPY;  Service: Endoscopy;  Laterality: N/A;   KNEE ARTHROSCOPY     left knee      Subjective Assessment - 07/23/20 1146     Subjective Pt reports that he was not able to do any of the exercises because of how busy he has been with helping friend out. 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 He also notes feeling dizziness with fatigue  and not eating or drinking much. He also describes feeling dizzy with positional changes such as prolonged sitting. Pt describes that he has been noticing increased cervical motion and decreased cervical pain.    Pertinent History Pt is a 70 y.o. male referred to PT for ataxia, imbalance, headaches/dizziness and shoulder/neck pain. Pt has PMH of recent diagnosis of neurosyphilis, depression, prediabetes, history of TIA's. Intermittent crystal meth use since September 2020. Neurosyphilis diagnosis lead to gait abnormality, ataxia, and imbalance. Lumbar puncture was performed on April 1st.  Although balance, head aches, and dizziness is improving, pt still appears to be having tension head aches, vertigo and imbalance. ENT believes pt may have vestibular neuritis. Head aches located at B temporal region that is squeezing in nature with nausea, ear congestion, dizziness (room spinning), and light sensitivity. The head aches begin after episodes of dizziness.  MRI performed on 03/02/20 with no evidence of red flags. Mild, chronic microvascular ischemic changes reported. Pt reports head movements and transferring from sit to stand and stand to sit pt loses his balance with lateral listing R > L. Denies spinning of room, lightheadedness, or swimminess but at end of session does retract statement about room spinning and reports sometimes he blieves when he moves his head he feels the beginnings of the room to spin. Worse in mornings when wkaing up and when wlaking and having to head turn or change direction quickly.  Pt also denies any real falls but when gait abnormalities was bad he reports running into walls constantly for support. Pt denies loss of b/b function, sensation changes or significant weight loss. Pt reports his memory deficits are still present specifically with short term memory. Pt's goals with PT are to improve his walking and shoulder/neck pain without feeling like he has to walk into walls or falling. Pt  believes his neck and shoulder tension could be related to his imbalance and wishes to work on both as able.    Limitations House hold activities;Walking;Standing    Patient Stated Goals improve his walking and shoulder/neck pain without feeling like he has to walk into walls.    Currently in Pain? No/denies    Pain Score 0-No pain    Pain Onset More than a month ago            VESTIBULAR EVALUATION   VBI: - Bilateral   Orthostatics   Supine: BP 114/62, HR 75 Sitting:  BP  129/78 Standing: BP 119/77  DIX-HALLPIKE:  Negative Bilateral   VESTIBULAR:   CENTRAL:  SPONTANEOUS NYSTAGMUS: Negative   SMOOTH PURSUIT: Negative   GAZE EVOKED NYSTAGMUS: Negative   SACCADES: Negative    PERIPHERAL:  HEAD SHAKE VOR: Negative Bilateral   HEAT THRUST VOR: Negative Bilateral   DVA VOR:  static  Negative  (7th line)     dynamic Positive  (4th line ) difference of three   Plan - 07/23/20 1508     Clinical Impression Statement Pt evaluated further for vestibular related impairments. Orthostatics ruled out, VBI, BPPV all ruled out during assessment. Pt did demonstrate deficts with visual dynamic acuity, and he will benefit from further VOR training.Further progression of static and dynamic gait exercises were deferred during session due to futher assessment of central and peripheral vestibular system. 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    Personal Factors and Comorbidities Age;Comorbidity 3+;Past/Current Experience;Social Background;Time since onset of injury/illness/exacerbation    Comorbidities depression, prediabetes, hx of TIA, neurosyphilis    Examination-Activity Limitations Squat;Hygiene/Grooming;Locomotion Level;Stairs;Reach Overhead;Toileting;Transfers    Examination-Participation Restrictions Cleaning;Community Activity;Yard  Work    Stability/Clinical Decision Making Unstable/Unpredictable    Rehab Potential Good    PT Frequency 2x / week    PT Duration 8 weeks    PT Treatment/Interventions ADLs/Self Care Home Management;Biofeedback;Canalith Repostioning;Electrical Stimulation;Moist Heat;DME Instruction;Gait training;Stair training;Functional mobility training;Therapeutic activities;Therapeutic exercise;Balance training;Neuromuscular re-education;Patient/family education;Manual techniques;Passive range of motion;Dry needling;Vestibular    PT Next Visit Plan dynamic gait activities with changing terrain, VOR exercises x 1 x 2 reading lines    PT Home Exercise Plan Corner Static Semitandem EC and EC with horizontal and vertical head turns, walking with horizontal and vertical head turns over varied terrian, VOR X 1, Brandt-Daroff exercises    Consulted and Agree with Plan of Care Patient                PT Short Term Goals - 07/23/20 1312       PT SHORT TERM GOAL #1   Title Pt will be indep with HEP to improve functional mobility.    Baseline 5/25: established    Time 4    Period Weeks    Status New    Target Date 07/29/20               PT Long Term  Goals - 07/23/20 1313       PT LONG TERM GOAL #1   Title Pt will improve FOTO to target score to indicate clinicially significant improvement in functional mobility.    Baseline 5/25: 53/68    Time 8    Period Weeks    Status New      PT LONG TERM GOAL #2   Title Pt will improve FGA to > 25/30 to indicate clinically significant reduction in falls.    Baseline 5/31: 21/30    Time 8    Period Weeks    Status New      PT LONG TERM GOAL #3   Title Pt will be able to perform L hip flexion to 4/5 via MMT to display clinically significant improvements in hip flexion strength.    Baseline 5/25: 2+/5 on L hip flexion.    Time 8    Period Weeks    Status New      PT LONG TERM GOAL #4   Title Pt will report no dizziness/imbalance when extending  cervical spine to perform overhead ADLs and getting out of bed.    Baseline 5/25: IMbalance and beginning of room to spin when looking up    Time 8    Period Weeks    Status New      PT LONG TERM GOAL #5   Title Pt will report being able to wlak > 1 mile with no lateral listing to indicate a decreased risk of falls with community ambulation tasks.    Baseline 5/25: R lateral list with stepping strategy to correct LOb wlaking in clinic.    Time 8    Period Weeks    Status New      PT LONG TERM GOAL #6   Title Pt will improve 5xSTS to < 12 sec to indicate decreased falls risk and improvement in BLE strength.    Baseline 5/25: 13.7 sec    Time 8    Period Weeks    Status New               Patient will benefit from skilled therapeutic intervention in order to improve the following deficits and impairments:  Abnormal gait, Dizziness, Pain, Decreased mobility, Decreased strength, Decreased balance, Difficulty walking  Visit Diagnosis: Other abnormalities of gait and mobility  Muscle weakness (generalized)  Neck pain  Difficulty in walking, not elsewhere classified  Abnormality of gait and mobility   Problem List Patient Active Problem List   Diagnosis Date Noted   Severe recurrent major depression without psychotic features ([place]) 12/13/2019   Symptoms of depression 05/30/2019   BPH with obstruction/lower urinary tract symptoms 06/22/2017   Mixed hyperlipidemia 06/20/2017   Right lateral epicondylitis 08/04/2016   Screening for prostate cancer 08/04/2016   Osteoarthritis of knees, bilateral 02/11/2016   ED (erectile dysfunction) 04/20/2015   Insomnia 04/20/2015   Paresthesia 03/16/2015   RLS (restless legs syndrome) 03/16/2015   Colon cancer screening 03/16/2015   Bradly Chris PT, DPT  07/23/2020, 3:09 PM  [facility] [address], [place], [address] Phone: [phone]   Fax:   [phone]  Name: Tony Franco MRN: [id number] Date of Birth: 11/19/1950

## 2020-07-28 ENCOUNTER — Encounter: Payer: Medicare Other | Admitting: Physical Therapy

## 2020-07-28 DIAGNOSIS — F321 Major depressive disorder, single episode, moderate: Secondary | ICD-10-CM | POA: Diagnosis not present

## 2020-07-30 ENCOUNTER — Encounter: Payer: Medicare Other | Admitting: Physical Therapy

## 2020-08-04 ENCOUNTER — Encounter: Payer: Self-pay | Admitting: Physical Therapy

## 2020-08-04 ENCOUNTER — Ambulatory Visit: Payer: Medicare Other | Admitting: Physical Therapy

## 2020-08-04 DIAGNOSIS — F321 Major depressive disorder, single episode, moderate: Secondary | ICD-10-CM | POA: Diagnosis not present

## 2020-08-04 DIAGNOSIS — R2689 Other abnormalities of gait and mobility: Secondary | ICD-10-CM | POA: Diagnosis not present

## 2020-08-04 DIAGNOSIS — M6281 Muscle weakness (generalized): Secondary | ICD-10-CM

## 2020-08-04 DIAGNOSIS — M542 Cervicalgia: Secondary | ICD-10-CM | POA: Diagnosis not present

## 2020-08-04 DIAGNOSIS — R262 Difficulty in walking, not elsewhere classified: Secondary | ICD-10-CM | POA: Diagnosis not present

## 2020-08-04 DIAGNOSIS — R269 Unspecified abnormalities of gait and mobility: Secondary | ICD-10-CM | POA: Diagnosis not present

## 2020-08-04 NOTE — Therapy (Signed)
[facility] [address] [place], [address] Phone: [phone]   Fax:  [phone]  Physical Therapy Treatment  Patient Details  Name: Tony Franco MRN: [id number] Date of Birth: [birth year] Referring Provider (PT): Frazier Richards   Encounter Date: 08/04/2020   PT End of Session - 08/04/20 1301     Visit Number 8    Number of Visits 17    Date for PT Re-Evaluation 08/26/20    PT Start Time 7169    PT Stop Time 1230    PT Time Calculation (min) 45 min    Equipment Utilized During Treatment Gait belt    Activity Tolerance Patient tolerated treatment well;No increased pain    Behavior During Therapy WFL for tasks assessed/performed             Past Medical History:  Diagnosis Date   Depression    Stroke [facility])    Syphilis    TIA (transient ischemic attack)    Vertigo     Past Surgical History:  Procedure Laterality Date   COLONOSCOPY WITH PROPOFOL N/A 04/28/2015   Procedure: COLONOSCOPY WITH PROPOFOL;  Surgeon: Christene Lye, MD;  Location: [facility] ENDOSCOPY;  Service: Endoscopy;  Laterality: N/A;   COLONOSCOPY WITH PROPOFOL N/A 09/09/2019   Procedure: COLONOSCOPY WITH PROPOFOL;  Surgeon: Lesly Rubenstein, MD;  Location: [facility]OSCOPY;  Service: Endoscopy;  Laterality: N/A;   ESOPHAGOGASTRODUODENOSCOPY (EGD) WITH PROPOFOL N/A 09/09/2019   Procedure: ESOPHAGOGASTRODUODENOSCOPY (EGD) WITH PROPOFOL;  Surgeon: Lesly Rubenstein, MD;  Location: [facility] ENDOSCOPY;  Service: Endoscopy;  Laterality: N/A;   KNEE ARTHROSCOPY     left knee    There were no vitals filed for this visit.   Subjective Assessment - 08/04/20 1147     Subjective Pt reports having a managed fall this past Sunday. He was gardening and he was walking and started to veer to right without being able to control himself. He made 90 degree turn and he went to controlled sit. He describes it as a spinning feeling. He believes  that when he is distracted he tends to feel dizzy more often. When he changes position he describes needing time for his vision to catch up with his balance.    Pertinent History Pt is a 70 y.o. male referred to PT for ataxia, imbalance, headaches/dizziness and shoulder/neck pain. Pt has PMH of recent diagnosis of neurosyphilis, depression, prediabetes, history of TIA's. Intermittent crystal meth use since September 2020. Neurosyphilis diagnosis lead to gait abnormality, ataxia, and imbalance. Lumbar puncture was performed on April 1st.  Although balance, head aches, and dizziness is improving, pt still appears to be having tension head aches, vertigo and imbalance. ENT believes pt may have vestibular neuritis. Head aches located at B temporal region that is squeezing in nature with nausea, ear congestion, dizziness (room spinning), and light sensitivity. The head aches begin after episodes of dizziness.  MRI performed on 03/02/20 with no evidence of red flags. Mild, chronic microvascular ischemic changes reported. Pt reports head movements and transferring from sit to stand and stand to sit pt loses his balance with lateral listing R > L. Denies spinning of room, lightheadedness, or swimminess but at end of session does retract statement about room spinning and reports sometimes he blieves when he moves his head he feels the beginnings of the room to spin. Worse in mornings when wkaing up and when wlaking and having to head turn or change direction quickly. Pt  also denies any real falls but when gait abnormalities was bad he reports running into walls constantly for support. Pt denies loss of b/b function, sensation changes or significant weight loss. Pt reports his memory deficits are still present specifically with short term memory. Pt's goals with PT are to improve his walking and shoulder/neck pain without feeling like he has to walk into walls or falling. Pt believes his neck and shoulder tension could be  related to his imbalance and wishes to work on both as able.    Limitations House hold activities;Walking;Standing    Patient Stated Goals improve his walking and shoulder/neck pain without feeling like he has to walk into walls.    Pain Onset More than a month ago              NMR:  VOR x 1 10 ft from Wall with letter "B" -Horizontal head turns 5 min  -Vertical head turns 5 min   Vestibular Substitution Vertical with letters "A" and "B" 20x  -Vcs to change head direction after looking at target   Dynamic VOR with number and letter chart 1 x  -Vcs to decrease head speed until letters can be read   Updated HEP and educated patient on changes to exercises and addition of new exercises             PT Short Term Goals - 08/04/20 1310       PT SHORT TERM GOAL #1   Title Pt will be indep with HEP to improve functional mobility.    Baseline 5/25: established    Time 4    Period Weeks    Status New    Target Date 07/29/20               PT Long Term Goals - 08/04/20 1309       PT LONG TERM GOAL #1   Title Pt will improve FOTO to target score to indicate clinicially significant improvement in functional mobility.    Baseline 5/25: 53/68    Time 8    Period Weeks    Status New      PT LONG TERM GOAL #2   Title Pt will improve FGA to > 25/30 to indicate clinically significant reduction in falls.    Baseline 5/31: 21/30    Time 8    Period Weeks    Status New      PT LONG TERM GOAL #3   Title Pt will be able to perform L hip flexion to 4/5 via MMT to display clinically significant improvements in hip flexion strength.    Baseline 5/25: 2+/5 on L hip flexion.    Time 8    Period Weeks    Status New      PT LONG TERM GOAL #4   Title Pt will report no dizziness/imbalance when extending cervical spine to perform overhead ADLs and getting out of bed.    Baseline 5/25: IMbalance and beginning of room to spin when looking up    Time 8    Period Weeks     Status New      PT LONG TERM GOAL #5   Title Pt will report being able to wlak > 1 mile with no lateral listing to indicate a decreased risk of falls with community ambulation tasks.    Baseline 5/25: R lateral list with stepping strategy to correct LOb wlaking in clinic.    Time 8    Period Weeks  Status New      PT LONG TERM GOAL #6   Title Pt will improve 5xSTS to < 12 sec to indicate decreased falls risk and improvement in BLE strength.    Baseline 5/25: 13.7 sec    Time 8    Period Weeks    Status New                   Plan - 08/04/20 1234     Clinical Impression Statement Pt presents for follow-up treatment for unsteadiness secondary to vestibular hypofunction. Pt demonstrates several instances of dizziness and unsteadiness during session, and he required minimal assistance to steady himself. Because of busy schedule, pt was unable to follow his home exercise plan. Therefore, prior VOR exercises were reviewed and only a few additional VOR exercises were added. Pt instructed to follow-up with PCP if he continues to have additional episodes of unsteadiness. He will continue to benefit from skilled PT to progress VOR function, and to decrease his unsteadiness to avoid falling and to be able to carry out construction duties.    Personal Factors and Comorbidities Age;Comorbidity 3+;Past/Current Experience;Social Background;Time since onset of injury/illness/exacerbation    Comorbidities depression, prediabetes, hx of TIA, neurosyphilis    Examination-Activity Limitations Squat;Hygiene/Grooming;Locomotion Level;Stairs;Reach Overhead;Toileting;Transfers    Examination-Participation Restrictions Cleaning;Community Activity;Yard Work    Stability/Clinical Decision Making Unstable/Unpredictable    Rehab Potential Good    PT Frequency 2x / week    PT Duration 8 weeks    PT Treatment/Interventions ADLs/Self Care Home Management;Biofeedback;Canalith Repostioning;Electrical  Stimulation;Moist Heat;DME Instruction;Gait training;Stair training;Functional mobility training;Therapeutic activities;Therapeutic exercise;Balance training;Neuromuscular re-education;Patient/family education;Manual techniques;Passive range of motion;Dry needling;Vestibular    PT Next Visit Plan Contiue progression of VOR x 1 exercises to include changing stances and start VOR x 2. VOR progression should include changing base of support and surfaces    PT Home Exercise Plan VOR x 1 with vertical and horizontal head turns, Vestibular substitution, VOR with chart    Consulted and Agree with Plan of Care Patient             Patient will benefit from skilled therapeutic intervention in order to improve the following deficits and impairments:  Abnormal gait, Dizziness, Pain, Decreased mobility, Decreased strength, Decreased balance, Difficulty walking  Visit Diagnosis: Other abnormalities of gait and mobility  Muscle weakness (generalized)  Neck pain     Problem List Patient Active Problem List   Diagnosis Date Noted   Severe recurrent major depression without psychotic features ([place]) 12/13/2019   Symptoms of depression 05/30/2019   BPH with obstruction/lower urinary tract symptoms 06/22/2017   Mixed hyperlipidemia 06/20/2017   Right lateral epicondylitis 08/04/2016   Screening for prostate cancer 08/04/2016   Osteoarthritis of knees, bilateral 02/11/2016   ED (erectile dysfunction) 04/20/2015   Insomnia 04/20/2015   Paresthesia 03/16/2015   RLS (restless legs syndrome) 03/16/2015   Colon cancer screening 03/16/2015    Daneil Dan 08/04/2020, 1:13 PM  [facility] [address], [place], [address] Phone: [phone]   Fax:  [phone]  Name: Tony Franco MRN: [id number] Date of Birth: 1950/09/27

## 2020-08-06 ENCOUNTER — Ambulatory Visit: Payer: Medicare Other | Admitting: Physical Therapy

## 2020-08-06 DIAGNOSIS — M6281 Muscle weakness (generalized): Secondary | ICD-10-CM | POA: Diagnosis not present

## 2020-08-06 DIAGNOSIS — R2689 Other abnormalities of gait and mobility: Secondary | ICD-10-CM

## 2020-08-06 DIAGNOSIS — R269 Unspecified abnormalities of gait and mobility: Secondary | ICD-10-CM | POA: Diagnosis not present

## 2020-08-06 DIAGNOSIS — R262 Difficulty in walking, not elsewhere classified: Secondary | ICD-10-CM | POA: Diagnosis not present

## 2020-08-06 DIAGNOSIS — M542 Cervicalgia: Secondary | ICD-10-CM | POA: Diagnosis not present

## 2020-08-06 NOTE — Therapy (Signed)
[facility] [address] [place], [address] Phone: [phone]   Fax:  [phone]  Physical Therapy Treatment  Patient Details  Name: Tony Franco MRN: [id number] Date of Birth: Jan 11, 1951 Referring Provider (PT): Frazier Richards   Encounter Date: 08/06/2020   PT End of Session - 08/06/20 1208     Visit Number 9    Number of Visits 17    Date for PT Re-Evaluation 08/26/20    PT Start Time 3007    PT Stop Time 1230    PT Time Calculation (min) 45 min    Equipment Utilized During Treatment Gait belt    Activity Tolerance Patient tolerated treatment well;No increased pain    Behavior During Therapy WFL for tasks assessed/performed             Past Medical History:  Diagnosis Date   Depression    Stroke [facility])    Syphilis    TIA (transient ischemic attack)    Vertigo     Past Surgical History:  Procedure Laterality Date   COLONOSCOPY WITH PROPOFOL N/A 04/28/2015   Procedure: COLONOSCOPY WITH PROPOFOL;  Surgeon: Christene Lye, MD;  Location: [facility]COPY;  Service: Endoscopy;  Laterality: N/A;   COLONOSCOPY WITH PROPOFOL N/A 09/09/2019   Procedure: COLONOSCOPY WITH PROPOFOL;  Surgeon: Lesly Rubenstein, MD;  Location: [facility];  Service: Endoscopy;  Laterality: N/A;   ESOPHAGOGASTRODUODENOSCOPY (EGD) WITH PROPOFOL N/A 09/09/2019   Procedure: ESOPHAGOGASTRODUODENOSCOPY (EGD) WITH PROPOFOL;  Surgeon: Lesly Rubenstein, MD;  Location: [facility]COPY;  Service: Endoscopy;  Laterality: N/A;   KNEE ARTHROSCOPY     left knee    There were no vitals filed for this visit.   Subjective Assessment - 08/06/20 1149     Subjective Pt reports experiencing increased dizziness and he states that he is much more aware of it and sensitive to it. He did not experience fall since last session. As far as exercises, he has not been able to do exercises as much as he should. He did notice a  difference with the dynamic VOR after trying to read letters at beginning to end of session. He notes feeling less cervical pain since starting PT, but he also notes an improvement in short term memory but that he still tends to forget things.    Pertinent History Pt is a 70 y.o. male referred to PT for ataxia, imbalance, headaches/dizziness and shoulder/neck pain. Pt has PMH of recent diagnosis of neurosyphilis, depression, prediabetes, history of TIA's. Intermittent crystal meth use since September 2020. Neurosyphilis diagnosis lead to gait abnormality, ataxia, and imbalance. Lumbar puncture was performed on April 1st.  Although balance, head aches, and dizziness is improving, pt still appears to be having tension head aches, vertigo and imbalance. ENT believes pt may have vestibular neuritis. Head aches located at B temporal region that is squeezing in nature with nausea, ear congestion, dizziness (room spinning), and light sensitivity. The head aches begin after episodes of dizziness.  MRI performed on 03/02/20 with no evidence of red flags. Mild, chronic microvascular ischemic changes reported. Pt reports head movements and transferring from sit to stand and stand to sit pt loses his balance with lateral listing R > L. Denies spinning of room, lightheadedness, or swimminess but at end of session does retract statement about room spinning and reports sometimes he blieves when he moves his head he feels the beginnings of the room to spin. Worse in mornings when  wkaing up and when wlaking and having to head turn or change direction quickly. Pt also denies any real falls but when gait abnormalities was bad he reports running into walls constantly for support. Pt denies loss of b/b function, sensation changes or significant weight loss. Pt reports his memory deficits are still present specifically with short term memory. Pt's goals with PT are to improve his walking and shoulder/neck pain without feeling like he has  to walk into walls or falling. Pt believes his neck and shoulder tension could be related to his imbalance and wishes to work on both as able.    Limitations House hold activities;Walking;Standing    Patient Stated Goals improve his walking and shoulder/neck pain without feeling like he has to walk into walls.    Currently in Pain? No/denies    Pain Onset More than a month ago              NMR:  VOR x 1 Exercise with B with feet apart  -Horizontal Head Turns x 60 sec  -Vertical Head Turns x 60 sec   VOR x 1 Exercise with B with feet together  -Horizontal Head Turns x 60 sec    -Noted hip sway -Vertical Head Turns x 60 sec   VOR X 1 Checkered Background with B with feet together  -Horizontal Head Turns x 60 sec  -Vertical Head Turns x 60 sec    -use of ankle strategy throughout    VOR X 1 Checkered Background with B with feet together on foam  -Horizontal Head Turns x 60 sec  -Vertical Head Turns x 60 sec    -use of ankle strategy>hip strategy   Motion Sensitivity with Ball Rotation 1 x 10  -Clockwise x 10  -Counter clockwise x 10   Discussion of coordination of care with other physicians, infectious disease and neurology about ongoing dizziness especially within past week.    PT Short Term Goals - 08/06/20 1213       PT SHORT TERM GOAL #1   Title Pt will be indep with HEP to improve functional mobility.    Baseline 5/25: established    Time 4    Period Weeks    Status New    Target Date 07/29/20               PT Long Term Goals - 08/06/20 1213       PT LONG TERM GOAL #1   Title Pt will improve FOTO to target score to indicate clinicially significant improvement in functional mobility.    Baseline 5/25: 53/68    Time 8    Period Weeks    Status New      PT LONG TERM GOAL #2   Title Pt will improve FGA to > 25/30 to indicate clinically significant reduction in falls.    Baseline 5/31: 21/30    Time 8    Period Weeks    Status New      PT LONG  TERM GOAL #3   Title Pt will be able to perform L hip flexion to 4/5 via MMT to display clinically significant improvements in hip flexion strength.    Baseline 5/25: 2+/5 on L hip flexion.    Time 8    Period Weeks    Status New      PT LONG TERM GOAL #4   Title Pt will report no dizziness/imbalance when extending cervical spine to perform overhead ADLs and getting out of bed.  Baseline 5/25: IMbalance and beginning of room to spin when looking up    Time 8    Period Weeks    Status New      PT LONG TERM GOAL #5   Title Pt will report being able to wlak > 1 mile with no lateral listing to indicate a decreased risk of falls with community ambulation tasks.    Baseline 5/25: R lateral list with stepping strategy to correct LOb wlaking in clinic.    Time 8    Period Weeks    Status New      PT LONG TERM GOAL #6   Title Pt will improve 5xSTS to < 12 sec to indicate decreased falls risk and improvement in BLE strength.    Baseline 5/25: 13.7 sec    Time 8    Period Weeks    Status New             HEP includes:   Ball Progression Circle 2 x 10 (Clockwise x 10, Counterclockwise x 10) x 7 days  VOR x 1  with B altering background and base of support  60 sec each x 7 days   Patient will benefit from skilled therapeutic intervention in order to improve the following deficits and impairments:  Abnormal gait, Dizziness, Pain, Decreased mobility, Decreased strength, Decreased balance, Difficulty walking  Visit Diagnosis: Other abnormalities of gait and mobility  Muscle weakness (generalized)  Difficulty in walking, not elsewhere classified     Problem List Patient Active Problem List   Diagnosis Date Noted   Severe recurrent major depression without psychotic features ([place]) 12/13/2019   Symptoms of depression 05/30/2019   BPH with obstruction/lower urinary tract symptoms 06/22/2017   Mixed hyperlipidemia 06/20/2017   Right lateral epicondylitis 08/04/2016   Screening  for prostate cancer 08/04/2016   Osteoarthritis of knees, bilateral 02/11/2016   ED (erectile dysfunction) 04/20/2015   Insomnia 04/20/2015   Paresthesia 03/16/2015   RLS (restless legs syndrome) 03/16/2015   Colon cancer screening 03/16/2015   Bradly Chris PT, DPT  08/06/2020, 10:25 PM  [facility] [address], [place], [address] Phone: [phone]   Fax:  [phone]  Name: Tony Franco MRN: [id number] Date of Birth: [birth year]

## 2020-08-07 ENCOUNTER — Encounter: Payer: Self-pay | Admitting: Physician Assistant

## 2020-08-07 ENCOUNTER — Ambulatory Visit: Payer: Medicare Other | Admitting: Physician Assistant

## 2020-08-07 ENCOUNTER — Other Ambulatory Visit: Payer: Self-pay

## 2020-08-07 DIAGNOSIS — Z113 Encounter for screening for infections with a predominantly sexual mode of transmission: Secondary | ICD-10-CM

## 2020-08-07 NOTE — Progress Notes (Signed)
[facility] Department STI clinic/screening visit  Subjective:  Tony Franco is a 70 y.o. male being seen today for an STI screening visit. The patient reports they do not have symptoms.    Patient has the following medical conditions:   Patient Active Problem List   Diagnosis Date Noted   Neurosyphilis 05/11/2020   H/O vertigo 03/12/2020   Severe recurrent major depression without psychotic features ([name]) 12/13/2019   Symptoms of depression 05/30/2019   BPH with obstruction/lower urinary tract symptoms 06/22/2017   Mixed hyperlipidemia 06/20/2017   Right lateral epicondylitis 08/04/2016   Screening for prostate cancer 08/04/2016   Osteoarthritis of knees, bilateral 02/11/2016   ED (erectile dysfunction) 04/20/2015   Insomnia 04/20/2015   Paresthesia 03/16/2015   RLS (restless legs syndrome) 03/16/2015   Colon cancer screening 03/16/2015     Chief Complaint  Patient presents with   SEXUALLY TRANSMITTED DISEASE    screening    HPI  Patient reports that he is not having any symptoms but would like a screening and titer check today.  Denies changes to his history since last visit.  Reports that he is taking medicines as prescribed and follows up with PCP and ID per their recommendations.  States last HIV test was in February of this year and last void prior to sample collection for GC/Chlamydia testing was less than 2 hr ago.   See flowsheet for further details and programmatic requirements.    The following portions of the patient's history were reviewed and updated as appropriate: allergies, current medications, past medical history, past social history, past surgical history and problem list.  Objective:  There were no vitals filed for this visit.  Physical Exam Constitutional:      General: He is not in acute distress.    Appearance: Normal appearance.  HENT:     Head: Normocephalic and atraumatic.  Eyes:     Conjunctiva/sclera: Conjunctivae  normal.  Pulmonary:     Effort: Pulmonary effort is normal.  Skin:    General: Skin is warm and dry.  Neurological:     Mental Status: He is alert and oriented to person, place, and time.  Psychiatric:        Mood and Affect: Mood normal.        Behavior: Behavior normal.        Thought Content: Thought content normal.        Judgment: Judgment normal.      Assessment and Plan:  Tony Franco is a 70 y.o. male presenting to the [facility] Department for STI screening  1. Screening for STD (sexually transmitted disease) Patient into clinic without symptoms. Patient declines exam by provider and opts to self-collect rectal sample for GC/Chlamydia testing.  Counseled how to collect for accurate results. Reviewed with patient recommendations for following Syphilis titers and that after initial monitoring at 6 and 12 months, it is recommended that he have a titer check done at least annually.  Rec condoms with all sex. Await test results.  Counseled that RN will call if needs to RTC for treatment once results are back.  - Chlamydia/Gonorrhea [facility] - HIV [facility] - Syphilis Serology, [facility] - Chlamydia/Gonorrhea [facility]     No follow-ups on file.  Future Appointments  Date Time Provider [place]  08/12/2020 10:15 AM Daneil Dan, PT [facility] None  08/18/2020  4:30 PM Daneil Dan, PT [facility] None  Jerene Dilling, PA

## 2020-08-12 ENCOUNTER — Ambulatory Visit: Payer: Medicare Other | Attending: Internal Medicine | Admitting: Physical Therapy

## 2020-08-12 ENCOUNTER — Other Ambulatory Visit: Payer: Self-pay

## 2020-08-12 DIAGNOSIS — M6281 Muscle weakness (generalized): Secondary | ICD-10-CM | POA: Diagnosis not present

## 2020-08-12 DIAGNOSIS — R262 Difficulty in walking, not elsewhere classified: Secondary | ICD-10-CM

## 2020-08-12 DIAGNOSIS — M542 Cervicalgia: Secondary | ICD-10-CM | POA: Diagnosis not present

## 2020-08-12 DIAGNOSIS — R269 Unspecified abnormalities of gait and mobility: Secondary | ICD-10-CM | POA: Insufficient documentation

## 2020-08-12 DIAGNOSIS — R2689 Other abnormalities of gait and mobility: Secondary | ICD-10-CM | POA: Diagnosis not present

## 2020-08-12 NOTE — Therapy (Signed)
[facility] [address], [place], [address] Phone: [phone]   Fax:  [phone]  Physical Therapy Treatment  Patient Details  Name: Tony Franco MRN: [id number] Date of Birth: [birth year] Referring Provider (PT): Frazier Richards   Encounter Date: 08/12/2020   PT End of Session - 08/12/20 1548     Visit Number 10    Number of Visits 17    Date for PT Re-Evaluation 08/26/20    PT Start Time 1761    PT Stop Time 1100    PT Time Calculation (min) 45 min    Equipment Utilized During Treatment Gait belt    Activity Tolerance Patient tolerated treatment well;No increased pain    Behavior During Therapy WFL for tasks assessed/performed             Past Medical History:  Diagnosis Date   Depression    Stroke [facility])    Syphilis    TIA (transient ischemic attack)    Vertigo     Past Surgical History:  Procedure Laterality Date   COLONOSCOPY WITH PROPOFOL N/A 04/28/2015   Procedure: COLONOSCOPY WITH PROPOFOL;  Surgeon: Christene Lye, MD;  Location: [facility];  Service: Endoscopy;  Laterality: N/A;   COLONOSCOPY WITH PROPOFOL N/A 09/09/2019   Procedure: COLONOSCOPY WITH PROPOFOL;  Surgeon: Lesly Rubenstein, MD;  Location: [facility];  Service: Endoscopy;  Laterality: N/A;   ESOPHAGOGASTRODUODENOSCOPY (EGD) WITH PROPOFOL N/A 09/09/2019   Procedure: ESOPHAGOGASTRODUODENOSCOPY (EGD) WITH PROPOFOL;  Surgeon: Lesly Rubenstein, MD;  Location: [facility];  Service: Endoscopy;  Laterality: N/A;   KNEE ARTHROSCOPY     left knee    There were no vitals filed for this visit.   Subjective Assessment - 08/12/20 1022     Subjective Pt states that he is experiencing increased fatigue, unsteadiness, and tiredness. He reports that this is something new that has noticed. He has to have friends help him walk home because of unsteadiness. Pt describes this as mis-stepping. Feeling compression  in head almost like his head is being compressed.    Pertinent History Pt is a 70 y.o. male referred to PT for ataxia, imbalance, headaches/dizziness and shoulder/neck pain. Pt has PMH of recent diagnosis of neurosyphilis, depression, prediabetes, history of TIA's. Intermittent crystal meth use since September 2020. Neurosyphilis diagnosis lead to gait abnormality, ataxia, and imbalance. Lumbar puncture was performed on April 1st.  Although balance, head aches, and dizziness is improving, pt still appears to be having tension head aches, vertigo and imbalance. ENT believes pt may have vestibular neuritis. Head aches located at B temporal region that is squeezing in nature with nausea, ear congestion, dizziness (room spinning), and light sensitivity. The head aches begin after episodes of dizziness.  MRI performed on 03/02/20 with no evidence of red flags. Mild, chronic microvascular ischemic changes reported. Pt reports head movements and transferring from sit to stand and stand to sit pt loses his balance with lateral listing R > L. Denies spinning of room, lightheadedness, or swimminess but at end of session does retract statement about room spinning and reports sometimes he blieves when he moves his head he feels the beginnings of the room to spin. Worse in mornings when wkaing up and when wlaking and having to head turn or change direction quickly. Pt also denies any real falls but when gait abnormalities was bad he reports running into walls constantly for support. Pt denies loss of b/b function, sensation changes or  significant weight loss. Pt reports his memory deficits are still present specifically with short term memory. Pt's goals with PT are to improve his walking and shoulder/neck pain without feeling like he has to walk into walls or falling. Pt believes his neck and shoulder tension could be related to his imbalance and wishes to work on both as able.    Limitations House hold  activities;Walking;Standing    Patient Stated Goals improve his walking and shoulder/neck pain without feeling like he has to walk into walls.    Currently in Pain? No/denies    Pain Onset More than a month ago             PHYSICAL PERFORMANCE  5xSTS: 10.33 sec   FGA: 26/30 -Stumbled after pivoting  -Stumbled while doing vertical head turns  FOTO: 72 Actual, 70 predicted   Follow up by contacting PCP about additional evaluation of patient for balance related deficits.    PT Short Term Goals - 08/12/20 1554       PT SHORT TERM GOAL #1   Title Pt will be indep with HEP to improve functional mobility.    Baseline 5/25: established    Time 4    Period Weeks    Status New    Target Date 07/29/20               PT Long Term Goals - 08/12/20 1555       PT LONG TERM GOAL #1   Title Pt will improve FOTO to target score to indicate clinicially significant improvement in functional mobility.    Baseline 5/25: 53/68  7/6: 72/70    Time 8    Period Weeks    Status Achieved      PT LONG TERM GOAL #2   Title Pt will improve FGA to > 25/30 to indicate clinically significant reduction in falls.    Baseline 5/31: 21/30 7/6: 26/30    Time 8    Period Weeks    Status Achieved      PT LONG TERM GOAL #3   Title Pt will be able to perform L hip flexion to 4/5 via MMT to display clinically significant improvements in hip flexion strength.    Baseline 5/25: 2+/5 on L hip flexion. 7/6: 4/5 on L hip flexion with posterior pelvic tilt and 2+/5 with anterior pelvic tilt    Time 8    Period Weeks    Status New      PT LONG TERM GOAL #4   Title Pt will report no dizziness/imbalance when extending cervical spine to perform overhead ADLs and getting out of bed.    Baseline 5/25: IMbalance and beginning of room to spin when looking up 7/6: Increased dizziness when looking down while walking    Time 8    Period Weeks    Status Not Met    Target Date 08/26/20      PT LONG TERM  GOAL #5   Title Pt will report being able to wlak > 1 mile with no lateral listing to indicate a decreased risk of falls with community ambulation tasks.    Baseline 5/25: R lateral list with stepping strategy to correct LOb wlaking in clinic. 7/6: Stll experiencing imbalance while walking    Time 8    Period Weeks    Status Partially Met      PT LONG TERM GOAL #6   Title Pt will improve 5xSTS to < 12 sec to indicate decreased falls  risk and improvement in BLE strength.    Baseline 5/25: 13.7 sec 7/6: 10.33 sec    Time 8    Period Weeks    Status Achieved               Plan - 08/12/20 1553     Clinical Impression Statement Pt presents for re-evaluation of goals for unsteadiness secondary to vestibular hypofunction. Despite decreasing his risk for falling by improving is LE strength and balance with dynamic gait, he is still experiencing an increase in unsteadiness and fatigue that likely require futher medical evaluation. PT recommends that pt be evaluated further by neurologist or ENT for potential neurological or vestibular deficits that are failing to respond to PT treatment. While pt is awaiting these tests, he will continue to benefit from PT treatment to improve vestibular dysfunction to avoid unsteadiness with overhead tasks and with walking.    Personal Factors and Comorbidities Age;Comorbidity 3+;Past/Current Experience;Social Background;Time since onset of injury/illness/exacerbation    Comorbidities depression, prediabetes, hx of TIA, neurosyphilis    Examination-Activity Limitations Squat;Hygiene/Grooming;Locomotion Level;Stairs;Reach Overhead;Toileting;Transfers    Examination-Participation Restrictions Cleaning;Community Activity;Yard Work    Stability/Clinical Decision Making Unstable/Unpredictable    Rehab Potential Good    PT Frequency 2x / week    PT Duration 8 weeks    PT Treatment/Interventions ADLs/Self Care Home Management;Biofeedback;Canalith  Repostioning;Electrical Stimulation;Moist Heat;DME Instruction;Gait training;Stair training;Functional mobility training;Therapeutic activities;Therapeutic exercise;Balance training;Neuromuscular re-education;Patient/family education;Manual techniques;Passive range of motion;Dry needling;Vestibular    PT Next Visit Plan VOR X 1 with uneven surface, VORx 2, and Dynamic VOR. Check in about infectious disease appointment and whether PCP reached out    PT Home Exercise Plan VOR x 1 with vertical and horizontal head turns, Vestibular substitution, VOR with chart    Consulted and Agree with Plan of Care Patient            HEP includes:  Continuation of VOR x 1 exercises with progression to decreasing BOS to progress exercise   Patient will benefit from skilled therapeutic intervention in order to improve the following deficits and impairments:  Abnormal gait, Dizziness, Pain, Decreased mobility, Decreased strength, Decreased balance, Difficulty walking  Visit Diagnosis: Other abnormalities of gait and mobility  Muscle weakness (generalized)  Difficulty in walking, not elsewhere classified  Neck pain     Problem List Patient Active Problem List   Diagnosis Date Noted   Neurosyphilis 05/11/2020   H/O vertigo 03/12/2020   Severe recurrent major depression without psychotic features ([place]) 12/13/2019   Symptoms of depression 05/30/2019   BPH with obstruction/lower urinary tract symptoms 06/22/2017   Mixed hyperlipidemia 06/20/2017   Right lateral epicondylitis 08/04/2016   Screening for prostate cancer 08/04/2016   Osteoarthritis of knees, bilateral 02/11/2016   ED (erectile dysfunction) 04/20/2015   Insomnia 04/20/2015   Paresthesia 03/16/2015   RLS (restless legs syndrome) 03/16/2015   Colon cancer screening 03/16/2015    Tony Franco 08/12/2020, 4:15 PM  [facility] [address], [place],  [address] Phone: [phone]   Fax:  [phone]  Name: Tony Franco MRN: [id number] Date of Birth: 02-01-1951

## 2020-08-13 ENCOUNTER — Telehealth: Payer: Self-pay

## 2020-08-13 LAB — HM HIV SCREENING LAB: HM HIV Screening: NEGATIVE

## 2020-08-13 NOTE — Telephone Encounter (Signed)
PC to pt to notify of positive test results for chlamydia/gonorrhea,advised recommendation is treatment . Made appt for pt 7/8 @230pm . Advised pt to eat prior to eat prior to appt.

## 2020-08-14 ENCOUNTER — Ambulatory Visit: Payer: Medicare Other

## 2020-08-14 ENCOUNTER — Other Ambulatory Visit: Payer: Self-pay

## 2020-08-14 DIAGNOSIS — A749 Chlamydial infection, unspecified: Secondary | ICD-10-CM

## 2020-08-14 DIAGNOSIS — A549 Gonococcal infection, unspecified: Secondary | ICD-10-CM

## 2020-08-14 DIAGNOSIS — Z708 Other sex counseling: Secondary | ICD-10-CM

## 2020-08-14 MED ORDER — CEFTRIAXONE SODIUM 500 MG IJ SOLR
500.0000 mg | Freq: Once | INTRAMUSCULAR | Status: AC
  Administered 2020-08-14: 500 mg via INTRAMUSCULAR

## 2020-08-14 MED ORDER — DOXYCYCLINE HYCLATE 100 MG PO TABS: 100.0000 mg | ORAL_TABLET | Freq: Two times a day (BID) | ORAL | 0 refills | Status: AC

## 2020-08-14 NOTE — Progress Notes (Signed)
Consulted by RN re: patient situation.  Reviewed RN note and agree that it reflects our discussion and my recommendations.

## 2020-08-14 NOTE — Progress Notes (Signed)
In Nurse Clinic for gonorrhea and chlamydia treatment. Pt reports increase in unsteadiness during past week. States hx of neurosyphilis and has regular f-u with PCP and physical therapist with upcoming appts in one week. Pt asks about RPR results from visit on 08/07/2020. Consult C. Lazarus Salines, [place] who explains RPR result (in process of being scanned into EHR) is 1:64 which has increased one dilution from result of 1:32 on 08/07/2020. Provider explains this increase is most likely d/t inflammatory process from gonorrhea and chlamydia infections and not a syphilis re-infection. Provider also discusses that pt was treated for syphilis 05/2020 and recommendation is for repeat titer in 6 mo and 12 mo. RN explained RPR results and provider comments. Pt's questions answered and reports understanding. Treated today for chlamydia with doxycycline 100mg  #14 per SO Dr. Lubertha Sayres with instructions to take one capsule by mouth twice daily for 7 days. Advised to notify [facility] if vomits within 2 hrs of taking med. Treated for gonorrhea with ceftriaxone 500mg  IM injection per SO Dr. Vertell Novak. Immediately after injection, pt states feelingdizzy. RN assisted pt to reclining position on exam table. Pt ate crackers and drank ginger ale and noticed improvement. Pt stayed for observation for approx 25 min without further incident. Pt walked normally without gait or balance issues before leaving. Advised to contact pcp/seek medical evaluation if needed. Josie Saunders, RN

## 2020-08-18 ENCOUNTER — Ambulatory Visit: Payer: Medicare Other | Admitting: Physical Therapy

## 2020-08-18 DIAGNOSIS — R269 Unspecified abnormalities of gait and mobility: Secondary | ICD-10-CM

## 2020-08-18 DIAGNOSIS — F321 Major depressive disorder, single episode, moderate: Secondary | ICD-10-CM | POA: Diagnosis not present

## 2020-08-18 DIAGNOSIS — R2689 Other abnormalities of gait and mobility: Secondary | ICD-10-CM

## 2020-08-18 DIAGNOSIS — M6281 Muscle weakness (generalized): Secondary | ICD-10-CM | POA: Diagnosis not present

## 2020-08-18 DIAGNOSIS — R262 Difficulty in walking, not elsewhere classified: Secondary | ICD-10-CM

## 2020-08-18 DIAGNOSIS — M542 Cervicalgia: Secondary | ICD-10-CM | POA: Diagnosis not present

## 2020-08-18 NOTE — Therapy (Signed)
[facility] [address] [place], [address] Phone: [phone]   Fax:  [phone]  Physical Therapy Treatment  Patient Details  Name: Tony Franco MRN: [id number] Date of Birth: 06-08-1950 Referring Provider (PT): Frazier Richards   Encounter Date: 08/18/2020   PT End of Session - 08/18/20 1650     Visit Number 11    Number of Visits 17    Date for PT Re-Evaluation 08/26/20    PT Start Time 1640    PT Stop Time 4034    PT Time Calculation (min) 35 min    Equipment Utilized During Treatment Gait belt    Activity Tolerance Patient tolerated treatment well;No increased pain    Behavior During Therapy WFL for tasks assessed/performed             Past Medical History:  Diagnosis Date   Depression    Stroke [facility])    Syphilis    TIA (transient ischemic attack)    Vertigo     Past Surgical History:  Procedure Laterality Date   COLONOSCOPY WITH PROPOFOL N/A 04/28/2015   Procedure: COLONOSCOPY WITH PROPOFOL;  Surgeon: Christene Lye, MD;  Location: [facility];  Service: Endoscopy;  Laterality: N/A;   COLONOSCOPY WITH PROPOFOL N/A 09/09/2019   Procedure: COLONOSCOPY WITH PROPOFOL;  Surgeon: Lesly Rubenstein, MD;  Location: [facility];  Service: Endoscopy;  Laterality: N/A;   ESOPHAGOGASTRODUODENOSCOPY (EGD) WITH PROPOFOL N/A 09/09/2019   Procedure: ESOPHAGOGASTRODUODENOSCOPY (EGD) WITH PROPOFOL;  Surgeon: Lesly Rubenstein, MD;  Location: [facility];  Service: Endoscopy;  Laterality: N/A;   KNEE ARTHROSCOPY     left knee    There were no vitals filed for this visit.   Subjective Assessment - 08/18/20 1638     Subjective Pt reports being informed about recent gonherrea and chlamydia infection. He is still experiencing fatigue and dizziness. He describes dizziness as spinning around and then stopping and like that feeling. He believes his short term memory is improving and that  his dizziness and fatigue is approving the past several days.    Pertinent History Pt is a 70 y.o. male referred to PT for ataxia, imbalance, headaches/dizziness and shoulder/neck pain. Pt has PMH of recent diagnosis of neurosyphilis, depression, prediabetes, history of TIA's. Intermittent crystal meth use since September 2020. Neurosyphilis diagnosis lead to gait abnormality, ataxia, and imbalance. Lumbar puncture was performed on April 1st.  Although balance, head aches, and dizziness is improving, pt still appears to be having tension head aches, vertigo and imbalance. ENT believes pt may have vestibular neuritis. Head aches located at B temporal region that is squeezing in nature with nausea, ear congestion, dizziness (room spinning), and light sensitivity. The head aches begin after episodes of dizziness.  MRI performed on 03/02/20 with no evidence of red flags. Mild, chronic microvascular ischemic changes reported. Pt reports head movements and transferring from sit to stand and stand to sit pt loses his balance with lateral listing R > L. Denies spinning of room, lightheadedness, or swimminess but at end of session does retract statement about room spinning and reports sometimes he blieves when he moves his head he feels the beginnings of the room to spin. Worse in mornings when wkaing up and when wlaking and having to head turn or change direction quickly. Pt also denies any real falls but when gait abnormalities was bad he reports running into walls constantly for support. Pt denies loss of b/b function, sensation changes  or significant weight loss. Pt reports his memory deficits are still present specifically with short term memory. Pt's goals with PT are to improve his walking and shoulder/neck pain without feeling like he has to walk into walls or falling. Pt believes his neck and shoulder tension could be related to his imbalance and wishes to work on both as able.    Limitations House hold  activities;Walking;Standing    Patient Stated Goals improve his walking and shoulder/neck pain without feeling like he has to walk into walls.    Currently in Pain? No/denies    Pain Onset More than a month ago             NMR:   Habituation Exercise: Progression Clockwise and Counterclockwise circles 2 x 10 with Red Ball   VOR x 1 with Feet Shoulder Width Apart 60 sec x 3  -Horizontal: #1: 50 head turns, #2: 76 head turns #3 96 -Vertical: # 1: 76 head turns, # 2 88 head turns, #3 106 head turns   VOR x 1 Romberg -Horizontal: 76 head turns    - 2x loss of balance and use of ankle strategies  -Vertical: 78 head turns    - 1x loss of balance with lateral sway and use of ankle strategies           PT Education - 08/18/20 1649     Education Details form/technique with exercise    Person(s) Educated Patient    Methods Explanation;Demonstration;Verbal cues    Comprehension Verbalized understanding;Returned demonstration              PT Short Term Goals - 08/19/20 0823       PT SHORT TERM GOAL #1   Title Pt will be indep with HEP to improve functional mobility.    Baseline 5/25: established    Time 4    Period Weeks    Status Achieved    Target Date 07/29/20               PT Long Term Goals - 08/19/20 0826       PT LONG TERM GOAL #1   Title Pt will improve FOTO to target score to indicate clinicially significant improvement in functional mobility.    Baseline 5/25: 53/68  7/6: 72/70    Time 8    Period Weeks    Status Achieved      PT LONG TERM GOAL #2   Title Pt will improve FGA to > 25/30 to indicate clinically significant reduction in falls.    Baseline 5/31: 21/30 7/6: 26/30    Time 8    Period Weeks    Status Achieved      PT LONG TERM GOAL #3   Title Pt will be able to perform L hip flexion to 4/5 via MMT to display clinically significant improvements in hip flexion strength.    Baseline 5/25: 2+/5 on L hip flexion. 7/6: 4/5 on L hip  flexion with posterior pelvic tilt and 2+/5 with anterior pelvic tilt    Time 8    Period Weeks    Status New      PT LONG TERM GOAL #4   Title Pt will report no dizziness/imbalance when extending cervical spine to perform overhead ADLs and getting out of bed.    Baseline 5/25: IMbalance and beginning of room to spin when looking up 7/6: Increased dizziness when looking down while walking    Time 8    Period Weeks  Status Not Met      PT LONG TERM GOAL #5   Title Pt will report being able to wlak > 1 mile with no lateral listing to indicate a decreased risk of falls with community ambulation tasks.    Baseline 5/25: R lateral list with stepping strategy to correct LOb wlaking in clinic. 7/6: Stll experiencing imbalance while walking    Time 8    Period Weeks    Status Partially Met      PT LONG TERM GOAL #6   Title Pt will improve 5xSTS to < 12 sec to indicate decreased falls risk and improvement in BLE strength.    Baseline 5/25: 13.7 sec 7/6: 10.33 sec    Time 8    Period Weeks    Status Achieved                   Plan - 08/18/20 1651     Clinical Impression Statement Pt presents for f/u for unsteadiness secondary to vestibular hypofunction. He was recently informed by his medical team that his increase in unsteadiness and fatigue was likely due to recent STD infections. He is now being treated and he has report a decrease in his symtpoms over past couple of days. He also exhibits an improvement unsteadiness with performance of VOR exercises without an increase in unsteadiness or sway compared to last session. PT recommends pt reach out to PCP to schedule appointment and request for VNG study to determine extent of vestibular deficits. Pt is in agreement with plan and he will continue to benefit from skilled PT to improve his imbalance especially with head turns to avoid falling and regain ability to perform overhead work such as painting on ceiling.    Personal Factors  and Comorbidities Age;Comorbidity 3+;Past/Current Experience;Social Background;Time since onset of injury/illness/exacerbation    Comorbidities depression, prediabetes, hx of TIA, neurosyphilis    Examination-Activity Limitations Squat;Hygiene/Grooming;Locomotion Level;Stairs;Reach Overhead;Toileting;Transfers    Examination-Participation Restrictions Cleaning;Community Activity;Yard Work    Stability/Clinical Decision Making Unstable/Unpredictable    Rehab Potential Good    PT Frequency 2x / week    PT Duration 8 weeks    PT Treatment/Interventions ADLs/Self Care Home Management;Biofeedback;Canalith Repostioning;Electrical Stimulation;Moist Heat;DME Instruction;Gait training;Stair training;Functional mobility training;Therapeutic activities;Therapeutic exercise;Balance training;Neuromuscular re-education;Patient/family education;Manual techniques;Passive range of motion;Dry needling;Vestibular    PT Next Visit Plan Explanation of VNG, VOR X 1 with uneven surface, VORx 2, and Dynamic VOR.    PT Home Exercise Plan VOR x 1 with vertical and horizontal head turns, Vestibular substitution, VOR with chart    Consulted and Agree with Plan of Care Patient             Patient will benefit from skilled therapeutic intervention in order to improve the following deficits and impairments:  Abnormal gait, Dizziness, Pain, Decreased mobility, Decreased strength, Decreased balance, Difficulty walking  Visit Diagnosis: Other abnormalities of gait and mobility  Difficulty in walking, not elsewhere classified  Abnormality of gait and mobility   Problem List Patient Active Problem List   Diagnosis Date Noted   Neurosyphilis 05/11/2020   H/O vertigo 03/12/2020   Severe recurrent major depression without psychotic features ([place]) 12/13/2019   Symptoms of depression 05/30/2019   BPH with obstruction/lower urinary tract symptoms 06/22/2017   Mixed hyperlipidemia 06/20/2017   Right lateral epicondylitis  08/04/2016   Screening for prostate cancer 08/04/2016   Osteoarthritis of knees, bilateral 02/11/2016   ED (erectile dysfunction) 04/20/2015   Insomnia 04/20/2015   Paresthesia 03/16/2015  RLS (restless legs syndrome) 03/16/2015   Colon cancer screening 03/16/2015   Bradly Chris PT, DPT  08/19/2020, 8:28 AM  [facility] [address] [place], [address] Phone: [phone]   Fax:  [phone]  Name: Tony Franco MRN: [id number] Date of Birth: May 30, 1950

## 2020-08-19 DIAGNOSIS — A539 Syphilis, unspecified: Secondary | ICD-10-CM | POA: Diagnosis not present

## 2020-08-19 DIAGNOSIS — A523 Neurosyphilis, unspecified: Secondary | ICD-10-CM | POA: Diagnosis not present

## 2020-08-20 ENCOUNTER — Ambulatory Visit: Payer: Medicare Other | Admitting: Physical Therapy

## 2020-08-20 DIAGNOSIS — R262 Difficulty in walking, not elsewhere classified: Secondary | ICD-10-CM | POA: Diagnosis not present

## 2020-08-20 DIAGNOSIS — M542 Cervicalgia: Secondary | ICD-10-CM | POA: Diagnosis not present

## 2020-08-20 DIAGNOSIS — R2689 Other abnormalities of gait and mobility: Secondary | ICD-10-CM

## 2020-08-20 DIAGNOSIS — R269 Unspecified abnormalities of gait and mobility: Secondary | ICD-10-CM

## 2020-08-20 DIAGNOSIS — M6281 Muscle weakness (generalized): Secondary | ICD-10-CM | POA: Diagnosis not present

## 2020-08-20 NOTE — Therapy (Signed)
[facility] [address], [place], [address] Phone: [phone]   Fax:  [phone]  Physical Therapy Treatment  Patient Details  Name: Tony Franco MRN: [id number] Date of Birth: 13-Oct-1950 Referring Provider (PT): Frazier Richards   Encounter Date: 08/20/2020   PT End of Session - 08/20/20 1000     Visit Number 12    Number of Visits 17    Date for PT Re-Evaluation 08/26/20    PT Start Time 0815    PT Stop Time 0850    PT Time Calculation (min) 35 min    Equipment Utilized During Treatment Gait belt    Activity Tolerance Patient tolerated treatment well;No increased pain    Behavior During Therapy WFL for tasks assessed/performed             Past Medical History:  Diagnosis Date   Depression    Stroke [facility])    Syphilis    TIA (transient ischemic attack)    Vertigo     Past Surgical History:  Procedure Laterality Date   COLONOSCOPY WITH PROPOFOL N/A 04/28/2015   Procedure: COLONOSCOPY WITH PROPOFOL;  Surgeon: Christene Lye, MD;  Location: [facility] ENDOSCOPY;  Service: Endoscopy;  Laterality: N/A;   COLONOSCOPY WITH PROPOFOL N/A 09/09/2019   Procedure: COLONOSCOPY WITH PROPOFOL;  Surgeon: Lesly Rubenstein, MD;  Location: [facility] ENDOSCOPY;  Service: Endoscopy;  Laterality: N/A;   ESOPHAGOGASTRODUODENOSCOPY (EGD) WITH PROPOFOL N/A 09/09/2019   Procedure: ESOPHAGOGASTRODUODENOSCOPY (EGD) WITH PROPOFOL;  Surgeon: Lesly Rubenstein, MD;  Location: [facility] ENDOSCOPY;  Service: Endoscopy;  Laterality: N/A;   KNEE ARTHROSCOPY     left knee    There were no vitals filed for this visit.   Subjective Assessment - 08/20/20 0812     Subjective Pt reports that he would like to work on dynamic walking exercises, because he feels like he is struggling when walking and making head turns. He reports experiencing most of his unsteadiness when initiating movement and when looking down while walking his  dog. He states that he mis-steps especially when moving head and tracking an object and that he is fine when not looking at object.    Pertinent History Pt is a 70 y.o. male referred to PT for ataxia, imbalance, headaches/dizziness and shoulder/neck pain. Pt has PMH of recent diagnosis of neurosyphilis, depression, prediabetes, history of TIA's. Intermittent crystal meth use since September 2020. Neurosyphilis diagnosis lead to gait abnormality, ataxia, and imbalance. Lumbar puncture was performed on April 1st.  Although balance, head aches, and dizziness is improving, pt still appears to be having tension head aches, vertigo and imbalance. ENT believes pt may have vestibular neuritis. Head aches located at B temporal region that is squeezing in nature with nausea, ear congestion, dizziness (room spinning), and light sensitivity. The head aches begin after episodes of dizziness.  MRI performed on 03/02/20 with no evidence of red flags. Mild, chronic microvascular ischemic changes reported. Pt reports head movements and transferring from sit to stand and stand to sit pt loses his balance with lateral listing R > L. Denies spinning of room, lightheadedness, or swimminess but at end of session does retract statement about room spinning and reports sometimes he blieves when he moves his head he feels the beginnings of the room to spin. Worse in mornings when wkaing up and when wlaking and having to head turn or change direction quickly. Pt also denies any real falls but when gait abnormalities was bad  he reports running into walls constantly for support. Pt denies loss of b/b function, sensation changes or significant weight loss. Pt reports his memory deficits are still present specifically with short term memory. Pt's goals with PT are to improve his walking and shoulder/neck pain without feeling like he has to walk into walls or falling. Pt believes his neck and shoulder tension could be related to his imbalance and  wishes to work on both as able.    Limitations House hold activities;Walking;Standing    Patient Stated Goals improve his walking and shoulder/neck pain without feeling like he has to walk into walls.    Currently in Pain? No/denies    Pain Onset More than a month ago             NMR:  10 m walk starting in 18' chair  head turns right and left turn with Vcs x 8 -Right lateral sway  10 m walk starting in 18' chair  head turns up and down with Vcs x 8 -Right lateral sway  10 m walk starting in 18' chair and stepping over 3 x 6" obstacles x 4  10 m walk starting in 18' chair and steping onto 1 x 6" obstacles x 6  10 m walk starting in 18' chair and steping onto 1 x 6" obstacles with vertical head turns x 6  -Quicker steps to maintain balance  10 m walk starting in 18' chair and steping onto 2 x 6" obstacles x 6 10 m walk starting in 18' chair and steping onto 2 x 6" obstacles with vertical head turns  x 6  10 m walk starting in 18' chair and steping onto 3 x 6" obstacles  10 m walk starting in 18' chair and steping onto 3 x 6" obstacles with vertical head turns     Educated pt on explanation of need to continue static VOR exercises and VNG procedure. Link was sent to his email [email or web address]   PT Education - 08/20/20 0959     Education Details form/technique with exercise and explanation about plan of care and what to discuss with physician for follow-up.    Person(s) Educated Patient    Methods Explanation;Verbal cues    Comprehension Verbalized understanding;Returned demonstration              PT Short Term Goals - 08/20/20 1008       PT SHORT TERM GOAL #1   Title Pt will be indep with HEP to improve functional mobility.    Baseline 5/25: established    Time 4    Period Weeks    Status Achieved    Target Date 07/29/20               PT Long Term Goals - 08/20/20 1008       PT LONG TERM GOAL #1   Title Pt will  improve FOTO to target score to indicate clinicially significant improvement in functional mobility.    Baseline 5/25: 53/68  7/6: 72/70    Time 8    Period Weeks    Status Achieved      PT LONG TERM GOAL #2   Title Pt will improve FGA to > 25/30 to indicate clinically significant reduction in falls.    Baseline 5/31: 21/30 7/6: 26/30    Time 8    Period Weeks    Status Achieved      PT LONG TERM GOAL #3   Title Pt will be able to  perform L hip flexion to 4/5 via MMT to display clinically significant improvements in hip flexion strength.    Baseline 5/25: 2+/5 on L hip flexion. 7/6: 4/5 on L hip flexion with posterior pelvic tilt and 2+/5 with anterior pelvic tilt    Time 8    Period Weeks    Status New      PT LONG TERM GOAL #4   Title Pt will report no dizziness/imbalance when extending cervical spine to perform overhead ADLs and getting out of bed.    Baseline 5/25: IMbalance and beginning of room to spin when looking up 7/6: Increased dizziness when looking down while walking    Time 8    Period Weeks    Status Not Met      PT LONG TERM GOAL #5   Title Pt will report being able to wlak > 1 mile with no lateral listing to indicate a decreased risk of falls with community ambulation tasks.    Baseline 5/25: R lateral list with stepping strategy to correct LOb wlaking in clinic. 7/6: Stll experiencing imbalance while walking    Time 8    Period Weeks    Status Partially Met      PT LONG TERM GOAL #6   Title Pt will improve 5xSTS to < 12 sec to indicate decreased falls risk and improvement in BLE strength.    Baseline 5/25: 13.7 sec 7/6: 10.33 sec    Time 8    Period Weeks    Status Achieved                   Plan - 08/20/20 0840     Clinical Impression Statement Pt presents for f/u for unsteadiness and vestibular hypofunction secondary to neurosyphilis. He continues to experience unsteadiness with head movements while walking especially while looking down and  when getting up from chairs and in and out of bed. Pt exhibits some unsteadiness today with dynamic walking activities especially with head turns that resolved over several repititions. Pt's subjective report and lack of objective evidence in support of BPPV suggest possible motion sensitivity ontop of vestibular hypofunction. He also reports previously receiving a exam that is similar to VNG from ENT back in March. Further review of pt's chart is needed to verify and if it was conducted to determine interpretation of results. He will continue to benefit from skilled PT to improve unsteadiness with head turns in both static and dynamic activities in order to decrease his risk of falling and improve his quality of life.    Personal Factors and Comorbidities Age;Comorbidity 3+;Past/Current Experience;Social Background;Time since onset of injury/illness/exacerbation    Comorbidities depression, prediabetes, hx of TIA, neurosyphilis    Examination-Activity Limitations Squat;Hygiene/Grooming;Locomotion Level;Stairs;Reach Overhead;Toileting;Transfers    Examination-Participation Restrictions Cleaning;Community Activity;Yard Work    Stability/Clinical Decision Making Unstable/Unpredictable    Rehab Potential Good    PT Frequency 2x / week    PT Duration 8 weeks    PT Treatment/Interventions ADLs/Self Care Home Management;Biofeedback;Canalith Repostioning;Electrical Stimulation;Moist Heat;DME Instruction;Gait training;Stair training;Functional mobility training;Therapeutic activities;Therapeutic exercise;Balance training;Neuromuscular re-education;Patient/family education;Manual techniques;Passive range of motion;Dry needling;Vestibular    PT Next Visit Plan Motion Sensitivity Exercises, Dynamic Walking Exercises, Review Chart for previous ENT note    PT Home Exercise Plan Vertical and horizontal head turns while walking, VOR x 1 with vertical and horizontal head turns, Vestibular substitution, VOR with chart     Consulted and Agree with Plan of Care Patient  Patient will benefit from skilled therapeutic intervention in order to improve the following deficits and impairments:  Abnormal gait, Dizziness, Pain, Decreased mobility, Decreased strength, Decreased balance, Difficulty walking  Visit Diagnosis: Other abnormalities of gait and mobility  Difficulty in walking, not elsewhere classified  Abnormality of gait and mobility  Muscle weakness (generalized)  Neck pain     Problem List Patient Active Problem List   Diagnosis Date Noted   Neurosyphilis 05/11/2020   H/O vertigo 03/12/2020   Severe recurrent major depression without psychotic features ([place]) 12/13/2019   Symptoms of depression 05/30/2019   BPH with obstruction/lower urinary tract symptoms 06/22/2017   Mixed hyperlipidemia 06/20/2017   Right lateral epicondylitis 08/04/2016   Screening for prostate cancer 08/04/2016   Osteoarthritis of knees, bilateral 02/11/2016   ED (erectile dysfunction) 04/20/2015   Insomnia 04/20/2015   Paresthesia 03/16/2015   RLS (restless legs syndrome) 03/16/2015   Colon cancer screening 03/16/2015   Bradly Chris PT, DPT  08/20/2020, 10:14 AM  [facility] [address] [place], [address] Phone: [phone]   Fax:  [phone]  Name: Tony Franco MRN: [id number] Date of Birth: 04-08-1950

## 2020-08-24 ENCOUNTER — Ambulatory Visit: Payer: Medicare Other | Admitting: Physical Therapy

## 2020-08-24 DIAGNOSIS — R262 Difficulty in walking, not elsewhere classified: Secondary | ICD-10-CM | POA: Diagnosis not present

## 2020-08-24 DIAGNOSIS — R2689 Other abnormalities of gait and mobility: Secondary | ICD-10-CM

## 2020-08-24 DIAGNOSIS — F32A Depression, unspecified: Secondary | ICD-10-CM | POA: Diagnosis not present

## 2020-08-24 DIAGNOSIS — A523 Neurosyphilis, unspecified: Secondary | ICD-10-CM | POA: Diagnosis not present

## 2020-08-24 DIAGNOSIS — R270 Ataxia, unspecified: Secondary | ICD-10-CM | POA: Diagnosis not present

## 2020-08-24 DIAGNOSIS — R269 Unspecified abnormalities of gait and mobility: Secondary | ICD-10-CM

## 2020-08-24 DIAGNOSIS — M542 Cervicalgia: Secondary | ICD-10-CM | POA: Diagnosis not present

## 2020-08-24 DIAGNOSIS — M6281 Muscle weakness (generalized): Secondary | ICD-10-CM | POA: Diagnosis not present

## 2020-08-24 NOTE — Therapy (Signed)
[facility] [address], [place], [address] Phone: [phone]   Fax:  [phone]  Physical Therapy Treatment  Patient Details  Name: Tony Franco MRN: [id number] Date of Birth: May 11, 1950 Referring Provider (PT): Frazier Richards   Encounter Date: 08/24/2020   PT End of Session - 08/24/20 1552     Visit Number 13    Number of Visits 17    Date for PT Re-Evaluation 08/26/20    PT Start Time 2620    PT Stop Time 1630    PT Time Calculation (min) 45 min    Equipment Utilized During Treatment Gait belt    Activity Tolerance Patient tolerated treatment well;No increased pain    Behavior During Therapy WFL for tasks assessed/performed             Past Medical History:  Diagnosis Date   Depression    Stroke [facility])    Syphilis    TIA (transient ischemic attack)    Vertigo     Past Surgical History:  Procedure Laterality Date   COLONOSCOPY WITH PROPOFOL N/A 04/28/2015   Procedure: COLONOSCOPY WITH PROPOFOL;  Surgeon: Christene Lye, MD;  Location: [facility] ENDOSCOPY;  Service: Endoscopy;  Laterality: N/A;   COLONOSCOPY WITH PROPOFOL N/A 09/09/2019   Procedure: COLONOSCOPY WITH PROPOFOL;  Surgeon: Lesly Rubenstein, MD;  Location: [facility]COPY;  Service: Endoscopy;  Laterality: N/A;   ESOPHAGOGASTRODUODENOSCOPY (EGD) WITH PROPOFOL N/A 09/09/2019   Procedure: ESOPHAGOGASTRODUODENOSCOPY (EGD) WITH PROPOFOL;  Surgeon: Lesly Rubenstein, MD;  Location: [facility]COPY;  Service: Endoscopy;  Laterality: N/A;   KNEE ARTHROSCOPY     left knee    There were no vitals filed for this visit.   Subjective Assessment - 08/24/20 1549     Subjective Pt reports that he had recent I.D appointment where he was informed that his viral load is down. He states that his symptoms have remained about the same and the I.D phyiscian reports that he will need VNG on August 1st.    Pertinent History Pt is a 70 y.o.  male referred to PT for ataxia, imbalance, headaches/dizziness and shoulder/neck pain. Pt has PMH of recent diagnosis of neurosyphilis, depression, prediabetes, history of TIA's. Intermittent crystal meth use since September 2020. Neurosyphilis diagnosis lead to gait abnormality, ataxia, and imbalance. Lumbar puncture was performed on April 1st.  Although balance, head aches, and dizziness is improving, pt still appears to be having tension head aches, vertigo and imbalance. ENT believes pt may have vestibular neuritis. Head aches located at B temporal region that is squeezing in nature with nausea, ear congestion, dizziness (room spinning), and light sensitivity. The head aches begin after episodes of dizziness.  MRI performed on 03/02/20 with no evidence of red flags. Mild, chronic microvascular ischemic changes reported. Pt reports head movements and transferring from sit to stand and stand to sit pt loses his balance with lateral listing R > L. Denies spinning of room, lightheadedness, or swimminess but at end of session does retract statement about room spinning and reports sometimes he blieves when he moves his head he feels the beginnings of the room to spin. Worse in mornings when wkaing up and when wlaking and having to head turn or change direction quickly. Pt also denies any real falls but when gait abnormalities was bad he reports running into walls constantly for support. Pt denies loss of b/b function, sensation changes or significant weight loss. Pt reports his memory deficits  are still present specifically with short term memory. Pt's goals with PT are to improve his walking and shoulder/neck pain without feeling like he has to walk into walls or falling. Pt believes his neck and shoulder tension could be related to his imbalance and wishes to work on both as able.    Limitations House hold activities;Walking;Standing    Patient Stated Goals improve his walking and shoulder/neck pain without feeling  like he has to walk into walls.    Currently in Pain? No/denies    Pain Onset More than a month ago            NMR:   Romberg Eyes Closed with Horizontal Head Turns 1 x 10  -Mild sway and use of ankle strategies   Semi-tandem with Eyes Closed -R ft forward 30 sec  - Mod Sway Ankle > Hip Strategies   Semi-tandem with Eyes Closed - L ft forward 30 sec  -Mod Sway Ankle> Hip Strategies   10 m walk horiz head turns x 8  -Lateral sway to right on several trials   10 m walk vertical head turns x 8   10 m eyes closed walking forward x 2  - Lateral sway to left   10 m walk over three 6 inch obstacles x 2   10 m walk over three 6 inch obstacle with horiz head turns x 2   10 m walk over 2 x 6 inch obstacle with 1 x 6 inch step x 2   10 m walk over 2 x 6 inch obstacle with 1 x 6 inch step with horiz head turns x 2   10 m walk over 1 x 6 inch obstacle with 1 x 6 inch step and 1.5 foot   -Mod A to prevent pt from tripping after lateral sway    10 m walk over 1 x 6 inch obstacle with 1 x 6 inch step and 1.5 foot  with horiz head turns x 2  -Mod A to prevent pt from tripping after lateral sway        PT Short Term Goals - 08/25/20 1202       PT SHORT TERM GOAL #1   Title Pt will be indep with HEP to improve functional mobility.    Baseline 5/25: established    Time 4    Period Weeks    Status Achieved    Target Date 07/29/20               PT Long Term Goals - 08/25/20 1201       PT LONG TERM GOAL #1   Title Pt will improve FOTO to target score to indicate clinicially significant improvement in functional mobility.    Baseline 5/25: 53/68  7/6: 72/70    Time 8    Period Weeks    Status Achieved      PT LONG TERM GOAL #2   Title Pt will improve FGA to > 25/30 to indicate clinically significant reduction in falls.    Baseline 5/31: 21/30 7/6: 26/30    Time 8    Period Weeks    Status Achieved      PT LONG TERM GOAL #3   Title Pt will be able to perform L  hip flexion to 4/5 via MMT to display clinically significant improvements in hip flexion strength.    Baseline 5/25: 2+/5 on L hip flexion. 7/6: 4/5 on L hip flexion with posterior pelvic tilt and 2+/5 with  anterior pelvic tilt    Time 8    Period Weeks    Status New      PT LONG TERM GOAL #4   Title Pt will report no dizziness/imbalance when extending cervical spine to perform overhead ADLs and getting out of bed.    Baseline 5/25: IMbalance and beginning of room to spin when looking up 7/6: Increased dizziness when looking down while walking    Time 8    Period Weeks    Status Not Met      PT LONG TERM GOAL #5   Title Pt will report being able to wlak > 1 mile with no lateral listing to indicate a decreased risk of falls with community ambulation tasks.    Baseline 5/25: R lateral list with stepping strategy to correct LOb wlaking in clinic. 7/6: Stll experiencing imbalance while walking    Time 8    Period Weeks    Status Partially Met      PT LONG TERM GOAL #6   Title Pt will improve 5xSTS to < 12 sec to indicate decreased falls risk and improvement in BLE strength.    Baseline 5/25: 13.7 sec 7/6: 10.33 sec    Time 8    Period Weeks    Status Achieved                  Plan - 08/25/20 1150     Clinical Impression Statement Pt presents for f/u for unsteadiness and peripheral vestibular hypofunction 2/2 to neurosyphilis. He continues to exhibit bouts of unsteadiness when walking with and without head turns without any specific triggers. Pt showed right and left lateral drift while walking, which he usually displays right. During static balance activity, pt demonstrates a reliance on vision with increased ankle strategies for positions without visual input. Pt unable to complete HEP because of tiredness from NA meeting over weekend, and session was somewhat limited by fatigue. Pt is in process of scheduling ENT appointment for VNG study for further clarification about  vestibular deficits and whether there is nerve damage. Pt reports receiving previous VNG but he does not have record of results and it was from ENT that was out of [facility] network. He will continue to benefit from skilled PT and finish remaining appointments to decrease unsteadiness and risk of falling when ambulating community level distances.    Personal Factors and Comorbidities Age;Comorbidity 3+;Past/Current Experience;Social Background;Time since onset of injury/illness/exacerbation    Comorbidities depression, prediabetes, hx of TIA, neurosyphilis    Examination-Activity Limitations Squat;Hygiene/Grooming;Locomotion Level;Stairs;Reach Overhead;Toileting;Transfers    Examination-Participation Restrictions Cleaning;Community Activity;Yard Work    Stability/Clinical Decision Making Unstable/Unpredictable    Rehab Potential Good    PT Frequency 2x / week    PT Duration 8 weeks    PT Treatment/Interventions ADLs/Self Care Home Management;Biofeedback;Canalith Repostioning;Electrical Stimulation;Moist Heat;DME Instruction;Gait training;Stair training;Functional mobility training;Therapeutic activities;Therapeutic exercise;Balance training;Neuromuscular re-education;Patient/family education;Manual techniques;Passive range of motion;Dry needling;Vestibular    PT Next Visit Plan Motion Sensitivity Exercises, Dynamic Walking Exercises, Review Chart for previous ENT note    PT Home Exercise Plan Vertical and horizontal head turns while walking, VOR x 1 with vertical and horizontal head turns, Vestibular substitution, VOR with chart    Recommended Other Services VNG study to determine origins of vestibular issue    Consulted and Agree with Plan of Care Patient            Continue to work on previous VOR HEP   Patient will benefit from skilled therapeutic intervention  in order to improve the following deficits and impairments:  Abnormal gait, Dizziness, Pain, Decreased mobility, Decreased strength,  Decreased balance, Difficulty walking  Visit Diagnosis: Other abnormalities of gait and mobility  Difficulty in walking, not elsewhere classified  Abnormality of gait and mobility     Problem List Patient Active Problem List   Diagnosis Date Noted   Neurosyphilis 05/11/2020   H/O vertigo 03/12/2020   Severe recurrent major depression without psychotic features ([place]) 12/13/2019   Symptoms of depression 05/30/2019   BPH with obstruction/lower urinary tract symptoms 06/22/2017   Mixed hyperlipidemia 06/20/2017   Right lateral epicondylitis 08/04/2016   Screening for prostate cancer 08/04/2016   Osteoarthritis of knees, bilateral 02/11/2016   ED (erectile dysfunction) 04/20/2015   Insomnia 04/20/2015   Paresthesia 03/16/2015   RLS (restless legs syndrome) 03/16/2015   Colon cancer screening 03/16/2015   Bradly Chris PT, DPT  08/25/2020, 12:04 PM  [facility] [address] [place], [address] Phone: [phone]   Fax:  [phone]  Name: Tony Franco MRN: [id number] Date of Birth: 09-10-1950

## 2020-08-25 DIAGNOSIS — F321 Major depressive disorder, single episode, moderate: Secondary | ICD-10-CM | POA: Diagnosis not present

## 2020-08-26 ENCOUNTER — Ambulatory Visit: Payer: Medicare Other | Admitting: Physical Therapy

## 2020-08-26 DIAGNOSIS — R262 Difficulty in walking, not elsewhere classified: Secondary | ICD-10-CM

## 2020-08-26 DIAGNOSIS — M542 Cervicalgia: Secondary | ICD-10-CM | POA: Diagnosis not present

## 2020-08-26 DIAGNOSIS — R269 Unspecified abnormalities of gait and mobility: Secondary | ICD-10-CM | POA: Diagnosis not present

## 2020-08-26 DIAGNOSIS — R2689 Other abnormalities of gait and mobility: Secondary | ICD-10-CM

## 2020-08-26 DIAGNOSIS — M6281 Muscle weakness (generalized): Secondary | ICD-10-CM | POA: Diagnosis not present

## 2020-08-26 NOTE — Therapy (Signed)
[facility] [address] [place], [address] Phone: [phone]   Fax:  [phone]  Physical Therapy Discharge Summary    Episode of Care: 07/01/20- 08/26/20  Patient Details  Name: Tony Franco MRN: [id number] Date of Birth: [birth year] Referring Provider (PT): Frazier Richards   Encounter Date: 08/26/2020   PT End of Session - 08/26/20 1025     Visit Number 14    Number of Visits 17    Date for PT Re-Evaluation 08/26/20    PT Start Time 0174    PT Stop Time 1100    PT Time Calculation (min) 45 min    Equipment Utilized During Treatment Gait belt    Activity Tolerance Patient tolerated treatment well;No increased pain    Behavior During Therapy WFL for tasks assessed/performed             Past Medical History:  Diagnosis Date   Depression    Stroke [facility])    Syphilis    TIA (transient ischemic attack)    Vertigo     Past Surgical History:  Procedure Laterality Date   COLONOSCOPY WITH PROPOFOL N/A 04/28/2015   Procedure: COLONOSCOPY WITH PROPOFOL;  Surgeon: Christene Lye, MD;  Location: [facility] ENDOSCOPY;  Service: Endoscopy;  Laterality: N/A;   COLONOSCOPY WITH PROPOFOL N/A 09/09/2019   Procedure: COLONOSCOPY WITH PROPOFOL;  Surgeon: Lesly Rubenstein, MD;  Location: [facility] ENDOSCOPY;  Service: Endoscopy;  Laterality: N/A;   ESOPHAGOGASTRODUODENOSCOPY (EGD) WITH PROPOFOL N/A 09/09/2019   Procedure: ESOPHAGOGASTRODUODENOSCOPY (EGD) WITH PROPOFOL;  Surgeon: Lesly Rubenstein, MD;  Location: [facility] ENDOSCOPY;  Service: Endoscopy;  Laterality: N/A;   KNEE ARTHROSCOPY     left knee    There were no vitals filed for this visit.   Subjective Assessment - 08/26/20 1017     Subjective Pt reports feeling more tired than usual because he could not sleep. He notes that when standing up his unsteadiness has improvement, but he still experiences unsteadiness when walking and this is worse with his  tiredness.    Pertinent History Pt is a 70 y.o. male referred to PT for ataxia, imbalance, headaches/dizziness and shoulder/neck pain. Pt has PMH of recent diagnosis of neurosyphilis, depression, prediabetes, history of TIA's. Intermittent crystal meth use since September 2020. Neurosyphilis diagnosis lead to gait abnormality, ataxia, and imbalance. Lumbar puncture was performed on April 1st.  Although balance, head aches, and dizziness is improving, pt still appears to be having tension head aches, vertigo and imbalance. ENT believes pt may have vestibular neuritis. Head aches located at B temporal region that is squeezing in nature with nausea, ear congestion, dizziness (room spinning), and light sensitivity. The head aches begin after episodes of dizziness.  MRI performed on 03/02/20 with no evidence of red flags. Mild, chronic microvascular ischemic changes reported. Pt reports head movements and transferring from sit to stand and stand to sit pt loses his balance with lateral listing R > L. Denies spinning of room, lightheadedness, or swimminess but at end of session does retract statement about room spinning and reports sometimes he blieves when he moves his head he feels the beginnings of the room to spin. Worse in mornings when wkaing up and when wlaking and having to head turn or change direction quickly. Pt also denies any real falls but when gait abnormalities was bad he reports running into walls constantly for support. Pt denies loss of b/b function, sensation changes or significant weight loss. Pt  reports his memory deficits are still present specifically with short term memory. Pt's goals with PT are to improve his walking and shoulder/neck pain without feeling like he has to walk into walls or falling. Pt believes his neck and shoulder tension could be related to his imbalance and wishes to work on both as able.    Limitations House hold activities;Walking;Standing    Patient Stated Goals improve  his walking and shoulder/neck pain without feeling like he has to walk into walls.    Currently in Pain? No/denies    Pain Onset More than a month ago             NMR   Romberg VOR x 1 30 sec  -Slight unsteadiness with use of ankle strategies   Semi-tandem VOR x 1 Hor 30 sec x 4 -Use of ankle strategies  -R foot back is worse than left   Semi-tandem VOR x 1 Vert 30 sec x 2  -Ankle> hip strategies  -Intermittent lateral sway   Semi-tandem VOR x 1 30 sec  - Ankle strategies   Shoulder width Feet part with Vert VOR x 1  30 sec -Marked improvement in balance   Modified SLS on 6 inch cone 2 x 30 sec   Romberg Eyes Closed 30 sec   Romberg Eyes Closed with Vert Head Turns 1 x 10  Romberg Eyes Closed with Hor Head Turns 1 x 10   Semi-tandem Eyes Closed with Horiz Head Turns   EVAL:  Dynamic Visual Acuity  -Static 7th line  -Dynamic: 3rd line  >3 line difference = vestibular impairment     PT Education - 08/26/20 1024     Education Details form/technique with exercise    Person(s) Educated Patient    Methods Verbal cues;Explanation;Demonstration    Comprehension Returned demonstration;Verbalized understanding              PT Short Term Goals - 08/26/20 1405       PT SHORT TERM GOAL #1   Title Pt will be indep with HEP to improve functional mobility.    Baseline 5/25: established    Time 4    Period Weeks    Status Achieved    Target Date 07/29/20               PT Long Term Goals - 08/26/20 1221       PT LONG TERM GOAL #1   Title Pt will improve FOTO to target score to indicate clinicially significant improvement in functional mobility.    Baseline 5/25: 53/68  7/6: 72/70    Time 8    Period Weeks    Status Achieved      PT LONG TERM GOAL #2   Title Pt will improve FGA to > 25/30 to indicate clinically significant reduction in falls.    Baseline 5/31: 21/30 7/6: 26/30    Time 8    Period Weeks    Status Achieved      PT LONG TERM  GOAL #3   Title Pt will be able to perform L hip flexion to 4/5 via MMT to display clinically significant improvements in hip flexion strength.    Baseline 5/25: 2+/5 on L hip flexion. 7/6: 4/5 on L hip flexion with posterior pelvic tilt and 2+/5 with anterior pelvic tilt 7/20: Deficit does not effect function such as walking or climbing stairs    Time 8    Period Weeks    Status Not Met  PT LONG TERM GOAL #4   Title Pt will report no dizziness/imbalance when extending cervical spine to perform overhead ADLs and getting out of bed.    Baseline 5/25: IMbalance and beginning of room to spin when looking up 7/6: Increased dizziness when looking down while walking 7/20: Still experiencing unsteadiness when looking overhead    Time 8    Period Weeks    Status Not Met      PT LONG TERM GOAL #5   Title Pt will report being able to wlak > 1 mile with no lateral listing to indicate a decreased risk of falls with community ambulation tasks.    Baseline 5/25: R lateral list with stepping strategy to correct LOb wlaking in clinic. 7/6: Stll experiencing imbalance while walking 7/20: Still experiencing lateral deviations especially when tired    Time 8    Period Weeks    Status Partially Met      PT LONG TERM GOAL #6   Title Pt will improve 5xSTS to < 12 sec to indicate decreased falls risk and improvement in BLE strength.    Baseline 5/25: 13.7 sec 7/6: 10.33 sec    Time 8    Period Weeks    Status Achieved             Patient will benefit from skilled therapeutic intervention in order to improve the following deficits and impairments:  Abnormal gait, Dizziness, Pain, Decreased mobility, Decreased strength, Decreased balance, Difficulty walking  Visit Diagnosis: Other abnormalities of gait and mobility  Difficulty in walking, not elsewhere classified  Abnormality of gait and mobility     Problem List Patient Active Problem List   Diagnosis Date Noted   Neurosyphilis  05/11/2020   H/O vertigo 03/12/2020   Severe recurrent major depression without psychotic features ([place]) 12/13/2019   Symptoms of depression 05/30/2019   BPH with obstruction/lower urinary tract symptoms 06/22/2017   Mixed hyperlipidemia 06/20/2017   Right lateral epicondylitis 08/04/2016   Screening for prostate cancer 08/04/2016   Osteoarthritis of knees, bilateral 02/11/2016   ED (erectile dysfunction) 04/20/2015   Insomnia 04/20/2015   Paresthesia 03/16/2015   RLS (restless legs syndrome) 03/16/2015   Colon cancer screening 03/16/2015   Bradly Chris PT, DPT  08/26/2020, 2:12 PM  [facility] [address], [place], [address] Phone: [phone]   Fax:  [phone]  Name: Tony Franco MRN: [id number] Date of Birth: [birth year]

## 2020-09-07 DIAGNOSIS — H8112 Benign paroxysmal vertigo, left ear: Secondary | ICD-10-CM | POA: Diagnosis not present

## 2020-09-07 DIAGNOSIS — H903 Sensorineural hearing loss, bilateral: Secondary | ICD-10-CM | POA: Diagnosis not present

## 2020-09-08 DIAGNOSIS — F321 Major depressive disorder, single episode, moderate: Secondary | ICD-10-CM | POA: Diagnosis not present

## 2020-09-10 DIAGNOSIS — L03012 Cellulitis of left finger: Secondary | ICD-10-CM | POA: Diagnosis not present

## 2020-09-16 DIAGNOSIS — F331 Major depressive disorder, recurrent, moderate: Secondary | ICD-10-CM | POA: Diagnosis not present

## 2020-09-16 DIAGNOSIS — G4700 Insomnia, unspecified: Secondary | ICD-10-CM | POA: Diagnosis not present

## 2020-09-17 DIAGNOSIS — H8112 Benign paroxysmal vertigo, left ear: Secondary | ICD-10-CM | POA: Diagnosis not present

## 2020-09-28 DIAGNOSIS — F332 Major depressive disorder, recurrent severe without psychotic features: Secondary | ICD-10-CM | POA: Diagnosis not present

## 2020-09-28 DIAGNOSIS — Z114 Encounter for screening for human immunodeficiency virus [HIV]: Secondary | ICD-10-CM | POA: Diagnosis not present

## 2020-09-28 DIAGNOSIS — A523 Neurosyphilis, unspecified: Secondary | ICD-10-CM | POA: Diagnosis not present

## 2020-09-28 DIAGNOSIS — R270 Ataxia, unspecified: Secondary | ICD-10-CM | POA: Diagnosis not present

## 2020-10-03 ENCOUNTER — Emergency Department
Admission: EM | Admit: 2020-10-03 | Discharge: 2020-10-03 | Discharge status: Against Medical Advice | Payer: Medicare Other

## 2020-10-04 DIAGNOSIS — W57XXXA Bitten or stung by nonvenomous insect and other nonvenomous arthropods, initial encounter: Secondary | ICD-10-CM | POA: Diagnosis not present

## 2020-10-04 DIAGNOSIS — L03116 Cellulitis of left lower limb: Secondary | ICD-10-CM | POA: Diagnosis not present

## 2020-10-04 DIAGNOSIS — S70362A Insect bite (nonvenomous), left thigh, initial encounter: Secondary | ICD-10-CM | POA: Diagnosis not present

## 2020-10-06 ENCOUNTER — Other Ambulatory Visit: Payer: Self-pay

## 2020-10-06 ENCOUNTER — Encounter: Payer: Self-pay | Admitting: *Deleted

## 2020-10-06 ENCOUNTER — Emergency Department
Admission: EM | Admit: 2020-10-06 | Discharge: 2020-10-06 | Disposition: A | Discharge status: Home / Self Care | Payer: Medicare Other | Attending: Emergency Medicine | Admitting: Emergency Medicine

## 2020-10-06 DIAGNOSIS — L02416 Cutaneous abscess of left lower limb: Secondary | ICD-10-CM | POA: Diagnosis not present

## 2020-10-06 DIAGNOSIS — L0291 Cutaneous abscess, unspecified: Secondary | ICD-10-CM

## 2020-10-06 DIAGNOSIS — Z87891 Personal history of nicotine dependence: Secondary | ICD-10-CM | POA: Insufficient documentation

## 2020-10-06 LAB — CBC
HCT: 40 % (ref 39.0–52.0)
Hemoglobin: 13.6 g/dL (ref 13.0–17.0)
MCH: 31.5 pg (ref 26.0–34.0)
MCHC: 34 g/dL (ref 30.0–36.0)
MCV: 92.6 fL (ref 80.0–100.0)
Platelets: 270 10*3/uL (ref 150–400)
RBC: 4.32 MIL/uL (ref 4.22–5.81)
RDW: 12.8 % (ref 11.5–15.5)
WBC: 11 10*3/uL — ABNORMAL HIGH (ref 4.0–10.5)
nRBC: 0 % (ref 0.0–0.2)

## 2020-10-06 LAB — COMPREHENSIVE METABOLIC PANEL
ALT: 13 U/L (ref 0–44)
AST: 14 U/L — ABNORMAL LOW (ref 15–41)
Albumin: 3.9 g/dL (ref 3.5–5.0)
Alkaline Phosphatase: 67 U/L (ref 38–126)
Anion gap: 6 (ref 5–15)
BUN: 19 mg/dL (ref 8–23)
CO2: 26 mmol/L (ref 22–32)
Calcium: 8.9 mg/dL (ref 8.9–10.3)
Chloride: 107 mmol/L (ref 98–111)
Creatinine, Ser: 0.95 mg/dL (ref 0.61–1.24)
GFR, Estimated: >60 mL/min (ref >60)
Glucose, Bld: 123 mg/dL — ABNORMAL HIGH (ref 70–99)
Potassium: 4.3 mmol/L (ref 3.5–5.1)
Sodium: 139 mmol/L (ref 135–145)
Total Bilirubin: 0.6 mg/dL (ref 0.3–1.2)
Total Protein: 7 g/dL (ref 6.5–8.1)

## 2020-10-06 MED ORDER — LIDOCAINE HCL (PF) 1 % IJ SOLN
5.0000 mL | Freq: Once | INTRAMUSCULAR | Status: AC
  Administered 2020-10-06: 5 mL
  Filled 2020-10-06: qty 5

## 2020-10-06 NOTE — ED Triage Notes (Signed)
Pt has a spider bite to left upper leg 5 days ago.  Pt saw pmd and started on abx.  Pt has redness and pain at site.  No drainage.   Pt alert  speech clear.

## 2020-10-06 NOTE — ED Provider Notes (Signed)
[facility] Emergency Department Provider Note  ____________________________________________   I have reviewed the triage vital signs and the nursing notes.   HISTORY  Chief Complaint Insect Bite   History limited by: Not Limited   HPI Tony Franco is a 70 y.o. male who presents to the emergency department today because of concerns for continued and worsening infection to the back of his left thigh.  Patient states that he thinks he might of gotten an insect bite 5 days ago.  Started noticing some redness and went to walk-in clinic 2 days ago.  He states that they started him on antibiotics and steroids.  While some of the redness has improved he has noticed increasing pain and swelling to the main area of the alleged bite.  Patient denies any fevers.  No nausea or vomiting.   Records reviewed. Per medical record review patient has a history of walk in clinic 2 days ago for cellulitis.   Past Medical History:  Diagnosis Date   Depression    Stroke [facility])    Syphilis    TIA (transient ischemic attack)    Vertigo     Patient Active Problem List   Diagnosis Date Noted   Neurosyphilis 05/11/2020   H/O vertigo 03/12/2020   Severe recurrent major depression without psychotic features ([place]) 12/13/2019   Symptoms of depression 05/30/2019   BPH with obstruction/lower urinary tract symptoms 06/22/2017   Mixed hyperlipidemia 06/20/2017   Right lateral epicondylitis 08/04/2016   Screening for prostate cancer 08/04/2016   Osteoarthritis of knees, bilateral 02/11/2016   ED (erectile dysfunction) 04/20/2015   Insomnia 04/20/2015   Paresthesia 03/16/2015   RLS (restless legs syndrome) 03/16/2015   Colon cancer screening 03/16/2015    Past Surgical History:  Procedure Laterality Date   COLONOSCOPY WITH PROPOFOL N/A 04/28/2015   Procedure: COLONOSCOPY WITH PROPOFOL;  Surgeon: Christene Lye, MD;  Location: [facility] ENDOSCOPY;  Service: Endoscopy;   Laterality: N/A;   COLONOSCOPY WITH PROPOFOL N/A 09/09/2019   Procedure: COLONOSCOPY WITH PROPOFOL;  Surgeon: Lesly Rubenstein, MD;  Location: [facility] ENDOSCOPY;  Service: Endoscopy;  Laterality: N/A;   ESOPHAGOGASTRODUODENOSCOPY (EGD) WITH PROPOFOL N/A 09/09/2019   Procedure: ESOPHAGOGASTRODUODENOSCOPY (EGD) WITH PROPOFOL;  Surgeon: Lesly Rubenstein, MD;  Location: [facility] ENDOSCOPY;  Service: Endoscopy;  Laterality: N/A;   KNEE ARTHROSCOPY     left knee    Prior to Admission medications   Medication Sig Start Date End Date Taking? Authorizing Provider  albuterol (VENTOLIN HFA) 108 (90 Base) MCG/ACT inhaler Inhale 2 puffs into the lungs every 4 (four) hours as needed for wheezing or shortness of breath (cough). Patient not taking: No sig reported 06/13/19   Olin Hauser, DO  buPROPion [facility] SR) 100 MG 12 hr tablet Take 100 mg by mouth every morning. 03/06/20   [provider]  DESCOVY 200-25 MG tablet Take 1 tablet by mouth daily. 07/22/20   [provider]  diclofenac Sodium (VOLTAREN) 1 % GEL Apply 2 g topically 4 (four) times daily as needed (arthritis knee pain). Patient not taking: No sig reported 10/17/19   Olin Hauser, DO  diltiazem 2 % GEL Apply 1 application topically 3 (three) times daily. For 1 week as needed. Patient not taking: No sig reported 02/20/20   Olin Hauser, DO  fexofenadine (ALLEGRA) 180 MG tablet Take 180 mg by mouth daily.  Patient not taking: Reported on 04/27/2020    [provider]  fluticasone (FLONASE) 50 MCG/ACT nasal spray  Place into both nostrils. Patient not taking: No sig reported 03/18/20   [provider]  hydrocortisone (ANUSOL-HC) 2.5 % rectal cream Apply topically 2 (two) times daily. Patient not taking: Reported on 04/27/2020 03/03/20   [provider]  hydrOXYzine (ATARAX/VISTARIL) 25 MG tablet Take 1 tablet (25 mg total) by mouth every 6 (six) hours as needed for  itching. Patient not taking: No sig reported 02/22/20   Johnn Hai, PA-C  meclizine (ANTIVERT) 25 MG tablet Take 1 tablet (25 mg total) by mouth 2 (two) times daily as needed for dizziness. Patient not taking: No sig reported 03/02/20   Merlyn Lot, MD  mirtazapine (REMERON) 30 MG tablet Take 1 tablet (30 mg total) by mouth at bedtime. Patient not taking: Reported on 04/27/2020 02/13/20   Olin Hauser, DO  mupirocin cream (BACTROBAN) 2 % Apply 1 application topically 2 (two) times daily as needed. Toipcal antibiotic. For skin sores. For 1 week as needed Patient not taking: No sig reported 02/20/20   Olin Hauser, DO  predniSONE (STERAPRED UNI-PAK 48 TAB) 10 MG (48) TBPK tablet Take by mouth. Patient not taking: No sig reported 03/19/20   [provider]  promethazine (PHENERGAN) 12.5 MG tablet Take 1 tablet (12.5 mg total) by mouth every 6 (six) hours as needed. Patient not taking: No sig reported 03/02/20   Merlyn Lot, MD  sildenafil (REVATIO) 20 MG tablet Take 1-5 pills about 30 min prior to sex. Start with 1 and increase as needed. Patient not taking: No sig reported 11/27/18   Olin Hauser, DO  tamsulosin [facility]) 0.4 MG CAPS capsule Take 1 capsule by mouth once daily Patient not taking: Reported on 08/07/2020 10/27/19   Olin Hauser, DO  triamcinolone cream (KENALOG) 0.5 % Apply 1 application topically 2 (two) times daily. Topical steroid. To affected areas, for up to 2 weeks for bites Patient not taking: Reported on 04/27/2020 02/20/20   Olin Hauser, DO  zolpidem (AMBIEN CR) 6.25 MG CR tablet Take 1 tablet (6.25 mg total) by mouth at bedtime as needed for sleep. Patient not taking: Reported on 08/07/2020 04/07/20   Olin Hauser, DO    Allergies Mirtazapine  Family History  Problem Relation Age of Onset   Heart attack Mother    Heart attack Father     Social History Social History   Tobacco Use    Smoking status: Former    Types: Cigarettes   Smokeless tobacco: Never  Vaping Use   Vaping Use: Never used  Substance Use Topics   Alcohol use: Not Currently    Comment: occ   Drug use: Yes    Types: Marijuana, Amphetamines, IV    Comment: patient states that last use was one week ago    Review of Systems Constitutional: No fever/chills Eyes: No visual changes. ENT: No sore throat. Cardiovascular: Denies chest pain. Respiratory: Denies shortness of breath. Gastrointestinal: No abdominal pain.  No nausea, no vomiting.  No diarrhea.   Genitourinary: Negative for dysuria. Musculoskeletal: Positive for pain to back of left upper leg.  Skin: Positive for redness to the back of the left upper leg.  Neurological: Negative for headaches, focal weakness or numbness.  ____________________________________________   PHYSICAL EXAM:  VITAL SIGNS: ED Triage Vitals [10/06/20 1944]  Enc Vitals Group     BP 134/79     Pulse Rate 78     Resp 20     Temp 98.4 F (36.9 C)  Temp Source Oral     SpO2 95 %     Weight 175 lb (79.4 kg)     Height 6' (1.829 m)     Head Circumference      Peak Flow      Pain Score 5   Constitutional: Alert and oriented.  Eyes: Conjunctivae are normal.  ENT      Head: Normocephalic and atraumatic.      Nose: No congestion/rhinnorhea.      Mouth/Throat: Mucous membranes are moist.      Neck: No stridor. Hematological/Lymphatic/Immunilogical: No cervical lymphadenopathy. Cardiovascular: Normal rate, regular rhythm.  No murmurs, rubs, or gallops.  Respiratory: Normal respiratory effort without tachypnea nor retractions. Breath sounds are clear and equal bilaterally. No wheezes/rales/rhonchi. Gastrointestinal: Soft and non tender. No rebound. No guarding.  Genitourinary: Deferred Musculoskeletal: Normal range of motion in all extremities. No lower extremity edema. Neurologic:  Normal speech and language. No gross focal neurologic deficits are  appreciated.  Skin:  Erythema and swelling to back of left upper leg consistent with abscess.  Psychiatric: Mood and affect are normal. Speech and behavior are normal. Patient exhibits appropriate insight and judgment.  ____________________________________________    LABS (pertinent positives/negatives)  CBC wbc 11.0, hgb 13.6, plt 270 CMP wnl except glu 123, ast 14 ____________________________________________   EKG  None  ____________________________________________    RADIOLOGY  None   ____________________________________________   PROCEDURES  Procedures  Incision and Drainage of Abscess Location: left upper leg Anesthesia Local: 1% Lidocaine without Epi  Prep/Procedure: Skin Prep: Chlorahex Incised abscess with #11 blade Purulent discharge: large Probed to break up loculations Packed with 1/4" gauze Estimated blood loss: 2 ml  ____________________________________________   INITIAL IMPRESSION / ASSESSMENT AND PLAN / ED COURSE  Pertinent labs & imaging results that were available during my care of the patient were reviewed by me and considered in my medical decision making (see chart for details).   Patient presented to the emergency department today because of concerns for continued and worsening infection to the back of his left upper leg.  Clinical exam was consistent with abscess.  This was incised and drained with a large amount of purulent material expressed.  Discussed care with patient.  Encouraged patient to continue antibiotics prescribed at walk-in clinic.   ____________________________________________   FINAL CLINICAL IMPRESSION(S) / ED DIAGNOSES  Final diagnoses:  Abscess     Note: This dictation was prepared with Dragon dictation. Any transcriptional errors that result from this process are unintentional     Nance Pear, MD 10/06/20 2322

## 2020-10-06 NOTE — Discharge Instructions (Addendum)
As we discussed you can start pulling the packing out in 48 hours. If it happens to fall out on its own it does not need to be replaced. Please seek medical attention for any high fevers, chest pain, shortness of breath, change in behavior, persistent vomiting, bloody stool or any other new or concerning symptoms.

## 2020-10-13 DIAGNOSIS — F321 Major depressive disorder, single episode, moderate: Secondary | ICD-10-CM | POA: Diagnosis not present

## 2020-10-21 DIAGNOSIS — J019 Acute sinusitis, unspecified: Secondary | ICD-10-CM | POA: Diagnosis not present

## 2020-10-21 DIAGNOSIS — Z03818 Encounter for observation for suspected exposure to other biological agents ruled out: Secondary | ICD-10-CM | POA: Diagnosis not present

## 2020-10-21 DIAGNOSIS — J40 Bronchitis, not specified as acute or chronic: Secondary | ICD-10-CM | POA: Diagnosis not present

## 2020-11-03 DIAGNOSIS — F321 Major depressive disorder, single episode, moderate: Secondary | ICD-10-CM | POA: Diagnosis not present

## 2020-11-09 DIAGNOSIS — Z23 Encounter for immunization: Secondary | ICD-10-CM | POA: Diagnosis not present

## 2020-11-10 DIAGNOSIS — F321 Major depressive disorder, single episode, moderate: Secondary | ICD-10-CM | POA: Diagnosis not present

## 2020-11-12 DIAGNOSIS — R42 Dizziness and giddiness: Secondary | ICD-10-CM | POA: Diagnosis not present

## 2020-11-17 DIAGNOSIS — F321 Major depressive disorder, single episode, moderate: Secondary | ICD-10-CM | POA: Diagnosis not present

## 2020-11-24 DIAGNOSIS — F332 Major depressive disorder, recurrent severe without psychotic features: Secondary | ICD-10-CM | POA: Diagnosis not present

## 2020-11-24 DIAGNOSIS — F32A Depression, unspecified: Secondary | ICD-10-CM | POA: Diagnosis not present

## 2020-11-24 DIAGNOSIS — F321 Major depressive disorder, single episode, moderate: Secondary | ICD-10-CM | POA: Diagnosis not present

## 2020-11-24 DIAGNOSIS — A523 Neurosyphilis, unspecified: Secondary | ICD-10-CM | POA: Diagnosis not present

## 2020-11-24 DIAGNOSIS — Z114 Encounter for screening for human immunodeficiency virus [HIV]: Secondary | ICD-10-CM | POA: Diagnosis not present

## 2020-11-24 DIAGNOSIS — Z7252 High risk homosexual behavior: Secondary | ICD-10-CM | POA: Diagnosis not present

## 2020-11-24 DIAGNOSIS — Z113 Encounter for screening for infections with a predominantly sexual mode of transmission: Secondary | ICD-10-CM | POA: Diagnosis not present

## 2020-12-01 DIAGNOSIS — F321 Major depressive disorder, single episode, moderate: Secondary | ICD-10-CM | POA: Diagnosis not present

## 2020-12-04 ENCOUNTER — Other Ambulatory Visit: Payer: Self-pay

## 2020-12-04 ENCOUNTER — Telehealth: Payer: Self-pay

## 2020-12-04 ENCOUNTER — Other Ambulatory Visit (HOSPITAL_COMMUNITY)
Admission: RE | Admit: 2020-12-04 | Discharge: 2020-12-04 | Disposition: A | Discharge status: Home / Self Care | Payer: Medicare Other | Source: Ambulatory Visit | Attending: Internal Medicine | Admitting: Internal Medicine

## 2020-12-04 ENCOUNTER — Ambulatory Visit (INDEPENDENT_AMBULATORY_CARE_PROVIDER_SITE_OTHER): Payer: Medicare Other | Admitting: Internal Medicine

## 2020-12-04 VITALS — BP 108/69 | HR 90 | Temp 98.2°F | Resp 16 | Ht 72.0 in | Wt 175.0 lb

## 2020-12-04 DIAGNOSIS — Z114 Encounter for screening for human immunodeficiency virus [HIV]: Secondary | ICD-10-CM

## 2020-12-04 DIAGNOSIS — B04 Monkeypox: Secondary | ICD-10-CM | POA: Insufficient documentation

## 2020-12-04 DIAGNOSIS — Z7252 High risk homosexual behavior: Secondary | ICD-10-CM | POA: Diagnosis not present

## 2020-12-04 NOTE — Progress Notes (Signed)
Patient Active Problem List   Diagnosis Date Noted   Neurosyphilis 05/11/2020   H/O vertigo 03/12/2020   Severe recurrent major depression without psychotic features ([place]) 12/13/2019   Symptoms of depression 05/30/2019   BPH with obstruction/lower urinary tract symptoms 06/22/2017   Mixed hyperlipidemia 06/20/2017   Right lateral epicondylitis 08/04/2016   Screening for prostate cancer 08/04/2016   Osteoarthritis of knees, bilateral 02/11/2016   ED (erectile dysfunction) 04/20/2015   Insomnia 04/20/2015   Paresthesia 03/16/2015   RLS (restless legs syndrome) 03/16/2015   Colon cancer screening 03/16/2015     Subjective:  Tony Franco is a 70 y.o. M with Hx neurosyphilis SP treatment completed in April 2022 presents following monkeypox exposure.  He was last sexually active on Sunday(10/23) and participated in receptive anal sex.  Did not use contraception.  He reports that he started having anal discomfort on Wednesday evening(10/26).  He believes he has a lesion on the outside of his of his anus and feels "bumps" inside of his rectum.  He reports his lesions are painful and itchy.  Reports associated fatigue. His partner informed him that he has monkeypox and is following isolation precautions.  Pt denies sore throat, fever, chills, nausea, vomiting, penile discharge, dysuria, pain on defecation. Denies Hx of prior HSV outbreak.   Review of Systems  Constitutional:  Positive for malaise/fatigue. Negative for chills, fever and weight loss.  HENT:  Negative for congestion and sore throat.   Eyes:  Negative for blurred vision and double vision.  Respiratory:  Negative for cough, sputum production, shortness of breath and wheezing.   Cardiovascular: Negative.   Gastrointestinal:  Negative for diarrhea, nausea and vomiting.       Anal lesion  Genitourinary:  Negative for dysuria, frequency and urgency.  Musculoskeletal:  Negative for myalgias.  Neurological: Negative.   Negative for dizziness.  Psychiatric/Behavioral: Negative.     Past Medical History:  Diagnosis Date   Depression    Stroke [facility])    Syphilis    TIA (transient ischemic attack)    Vertigo     Outpatient Medications Prior to Visit  Medication Sig Dispense Refill   albuterol (VENTOLIN HFA) 108 (90 Base) MCG/ACT inhaler Inhale 2 puffs into the lungs every 4 (four) hours as needed for wheezing or shortness of breath (cough). (Patient not taking: No sig reported) 6.7 g 1   buPROPion (WELLBUTRIN SR) 100 MG 12 hr tablet Take 100 mg by mouth every morning.     DESCOVY 200-25 MG tablet Take 1 tablet by mouth daily.     diclofenac Sodium (VOLTAREN) 1 % GEL Apply 2 g topically 4 (four) times daily as needed (arthritis knee pain). (Patient not taking: No sig reported) 100 g 2   diltiazem 2 % GEL Apply 1 application topically 3 (three) times daily. For 1 week as needed. (Patient not taking: No sig reported) 30 g 2   fexofenadine (ALLEGRA) 180 MG tablet Take 180 mg by mouth daily.  (Patient not taking: Reported on 04/27/2020)     fluticasone (FLONASE) 50 MCG/ACT nasal spray Place into both nostrils. (Patient not taking: No sig reported)     hydrocortisone (ANUSOL-HC) 2.5 % rectal cream Apply topically 2 (two) times daily. (Patient not taking: Reported on 04/27/2020)     hydrOXYzine (ATARAX/VISTARIL) 25 MG tablet Take 1 tablet (25 mg total) by mouth every 6 (six) hours as needed for itching. (Patient not taking: No sig reported) 20 tablet 0  meclizine (ANTIVERT) 25 MG tablet Take 1 tablet (25 mg total) by mouth 2 (two) times daily as needed for dizziness. (Patient not taking: No sig reported) 20 tablet 0   mirtazapine (REMERON) 30 MG tablet Take 1 tablet (30 mg total) by mouth at bedtime. (Patient not taking: Reported on 04/27/2020) 30 tablet 2   mupirocin cream (BACTROBAN) 2 % Apply 1 application topically 2 (two) times daily as needed. Toipcal antibiotic. For skin sores. For 1 week as needed (Patient not  taking: No sig reported) 15 g 1   predniSONE (STERAPRED UNI-PAK 48 TAB) 10 MG (48) TBPK tablet Take by mouth. (Patient not taking: No sig reported)     promethazine (PHENERGAN) 12.5 MG tablet Take 1 tablet (12.5 mg total) by mouth every 6 (six) hours as needed. (Patient not taking: No sig reported) 12 tablet 0   sildenafil (REVATIO) 20 MG tablet Take 1-5 pills about 30 min prior to sex. Start with 1 and increase as needed. (Patient not taking: No sig reported) 30 tablet 2   tamsulosin (FLOMAX) 0.4 MG CAPS capsule Take 1 capsule by mouth once daily (Patient not taking: Reported on 08/07/2020) 90 capsule 3   triamcinolone cream (KENALOG) 0.5 % Apply 1 application topically 2 (two) times daily. Topical steroid. To affected areas, for up to 2 weeks for bites (Patient not taking: Reported on 04/27/2020) 30 g 1   zolpidem (AMBIEN CR) 6.25 MG CR tablet Take 1 tablet (6.25 mg total) by mouth at bedtime as needed for sleep. (Patient not taking: Reported on 08/07/2020) 30 tablet 2   No facility-administered medications prior to visit.     Allergies  Allergen Reactions   Mirtazapine Other (See Comments)    Patient reports serotonin toxicity     Social History   Tobacco Use   Smoking status: Former    Types: Cigarettes   Smokeless tobacco: Never  Vaping Use   Vaping Use: Never used  Substance Use Topics   Alcohol use: Not Currently    Comment: occ   Drug use: Yes    Types: Marijuana, Amphetamines, IV    Comment: patient states that last use was one week ago    Family History  Problem Relation Age of Onset   Heart attack Mother    Heart attack Father     Objective:  There were no vitals filed for this visit. There is no height or weight on file to calculate BMI.  Physical Exam Constitutional:      General: He is not in acute distress.    Appearance: He is normal weight. He is not toxic-appearing.  HENT:     Head: Normocephalic and atraumatic.     Right Ear: External ear normal.      Left Ear: External ear normal.     Nose: No congestion or rhinorrhea.     Mouth/Throat:     Mouth: Mucous membranes are moist.     Pharynx: Oropharynx is clear.  Eyes:     Extraocular Movements: Extraocular movements intact.     Conjunctiva/sclera: Conjunctivae normal.     Pupils: Pupils are equal, round, and reactive to light.  Cardiovascular:     Rate and Rhythm: Normal rate and regular rhythm.     Heart sounds: No murmur heard.   No friction rub. No gallop.  Pulmonary:     Effort: Pulmonary effort is normal.     Breath sounds: Normal breath sounds.  Abdominal:     General: Abdomen is flat. Bowel  sounds are normal.     Palpations: Abdomen is soft.  Genitourinary:    Comments: 7 O'clock lesion DRE: unable to tolerate past insertion of digits Musculoskeletal:        General: No swelling. Normal range of motion.     Cervical back: Normal range of motion and neck supple.  Skin:    General: Skin is warm and dry.  Neurological:     General: No focal deficit present.     Mental Status: He is oriented to person, place, and time.  Psychiatric:        Mood and Affect: Mood normal.    Lab Results: Lab Results  Component Value Date   WBC 11.0 (H) 10/06/2020   HGB 13.6 10/06/2020   HCT 40.0 10/06/2020   MCV 92.6 10/06/2020   PLT 270 10/06/2020    Lab Results  Component Value Date   CREATININE 0.95 10/06/2020   BUN 19 10/06/2020   NA 139 10/06/2020   K 4.3 10/06/2020   CL 107 10/06/2020   CO2 26 10/06/2020    Lab Results  Component Value Date   ALT 13 10/06/2020   AST 14 (L) 10/06/2020   ALKPHOS 67 10/06/2020   BILITOT 0.6 10/06/2020     Assessment & Plan:  #Human Monkeypox Virus with anal lesion #On PreP with Descovy -Given partner is positive for monkeypox, pt has an anal lesion with rectal discomfort consistent with monkeypox, he was started on TPOXX(meds handed to pt in clinic.) TPOXX 200 mg 3 tabs twice daily x 14 days dispensed by Cassie advised to take with  high fat meal (25g) 30 minutes before treatment -STI testing as below given recent exposure Obtained Monkeypox swab of lesion and rectum, HSV swab of lesion and rectal GC swab. RPR and HIV  -Follow-up visit in 1 week with Dr. Ola Spurr. Would recommend oral GC and urine at that time as well.    I Provided Informed Consent for treatment with Tecovirimat, pt provided > 20 minutes to review, all questions answered and signed by patient, signed copy provided-see attached document Advised to keep lesions covered Letter for work to be released until 12/25/20 (3 weeks)  Diary to maintain and provide to CDC as instructed If adverse events occur notify us immediately Notify us if new lesions develop   No sexual activity, avoid contact with public  Health department will be informed  Notify any partners and advise to seek immediate medical attention  Laurice Record, [place] [facility]   12/04/20  3:07 PM

## 2020-12-04 NOTE — Patient Instructions (Signed)
START Tecovirimat - 3 capsules taken TOGETHER twice a day with high fat meal before your medication, as mentioned below    You need 25 grams of fat 30 minutes before each dose of your medication:   This could look like:  -1 avocado -1/4 cup whole cashews  -1/2 cup almonds  -3 tablespoons of nut butter (almond, peanut, cashew)  -4 oz of cooked ground beef 80/20 fat content  -2.5 tablespoons of butter (put it on a bagel, bread!) -3.5 oz goat cheese (regular fat)   You may still experience new lesions coming up over the next 72 hours as the medication begins to work.   Please consider filling out the daily medication / symptom tracker for your convenience. Bring this with you to each follow up visit so we can see how your progress is doing.   You will need to stay isolated at home with well fitting mask over your nose and mouth for 3 weeks after your first rash came up, or until they are all crusted and fall off, whichever is first.   Disinfect common surfaces, bed & bath linens and clothing.   Would like to see you back in 1 week to discuss further - can be done over video visit if you prefer and are overall doing well.   See below for more information.    HUMAN MONKEYPOX ISOLATION RECOMMENDATIONS [email or web address]  A person with monkeypox can spread it to others from the time symptoms start until the rash has fully healed and a fresh layer of skin has formed. The illness typically lasts 2-4 weeks  Monkeypox can spread to anyone through close, personal, often skin-to-skin contact, including: Direct contact with monkeypox rash, scabs, or body fluids from a person with monkeypox. Touching objects, fabrics (clothing, bedding, or towels), and surfaces that have been used by someone with monkeypox. Contact with respiratory secretions. This direct contact can happen during intimate contact, including: Oral, anal, and vaginal sex  or touching the genitals (penis, testicles, labia, and vagina) or anus (butthole) of a person with monkeypox. Hugging, massage, and kissing. Prolonged face-to-face contact. Touching fabrics and objects during sex that were used by a person with monkeypox and that have not been disinfected, such as bedding, towels, fetish gear, and sex toys. A pregnant person can spread the virus to their fetus through the placenta. It's also possible for people to get monkeypox from infected animals, either by being scratched or bitten by the animal or by preparing or eating meat or using products from an infected animal. Isolation typically lasts two to four weeks.   If you are unable to isolate from others:  While symptomatic with a fever or any respiratory symptoms, including sore throat, nasal congestion, or cough, remain isolated in the home and away from others unless it is necessary to see a healthcare provider or for an emergency. This includes avoiding close or physical contact with other people and animals. Cover the lesions, wear a well-fitting mask (more information below), and avoid public transportation when leaving the home as required for medical care or an emergency. While a rash persists but in the absence of a fever or respiratory symptoms Cover all parts of the rash with clothing, gloves, and/or bandages. Wear a well-fitting mask to prevent the wearer from spreading oral and respiratory secretions when interacting with others until the rash and all other symptoms have resolved. Masks should fit closely on the face without any gaps along the edges or around the nose  and be comfortable when worn properly over the nose and mouth.  Until all signs and symptoms of monkeypox illness have fully resolved Do not share items that have been worn or handled with other people or animals. Launder or disinfect items that have been worn or handled and surfaces that have been touched by a lesion. Avoid close  physical contact, including sexual and/or close intimate contact, with other people. Avoid sharing utensils or cups. Items should be cleaned and disinfected before use by others. Avoid crowds and congregate settings. Wash hands often with soap and water or use an alcohol-based hand sanitizer, especially after direct contact with the rash.    Prevention in your Pets:  People with monkeypox should avoid contact with animals (specifically mammals), including pets. If possible, friends or family members should care for healthy animals until the owner has fully recovered. Keep any potentially infectious bandages, textiles (such as clothes, bedding) and other items away from pets, other domestic animals, and wildlife. In general, any mammal may become infected with monkeypox. It is not thought that other animals such as reptiles, fish or birds can be infected. If you notice an animal that had contact with an infected person appears sick (such as lethargy, lack of appetite, coughing, bloating, nasal or eye secretions or crust, fever, rash) contact the Psychologist, prison and probation services, Set designer, or state animal health official.

## 2020-12-04 NOTE — Telephone Encounter (Signed)
Pt had contacted this nurse stating that he was a contact to someone diagnosed with monkeypox and wanted to know if he could get tested. After speaking with Mr. Zwahlen discussed symptoms/onset. Contacted his ID - Dr. Junius Roads  from [place] was able to get him into see [facility] in [place] today. Advised pt .Pt was very appreciative

## 2020-12-07 ENCOUNTER — Ambulatory Visit: Payer: Self-pay | Admitting: Physician Assistant

## 2020-12-07 ENCOUNTER — Other Ambulatory Visit: Payer: Self-pay

## 2020-12-07 DIAGNOSIS — Z7189 Other specified counseling: Secondary | ICD-10-CM

## 2020-12-07 LAB — MONKEYPOX VIRUS DNA, QUALITATIVE REAL-TIME PCR
MONKEYPOX VIRUS DNA, QL PCR: DETECTED — CR
Orthopoxvirus DNA, QL PCR: DETECTED — CR

## 2020-12-07 LAB — CYTOLOGY, (ORAL, ANAL, URETHRAL) ANCILLARY ONLY
Chlamydia: NEGATIVE
Comment: NEGATIVE
Comment: NORMAL
Neisseria Gonorrhea: NEGATIVE

## 2020-12-07 NOTE — Progress Notes (Signed)
Patient diagnosed with and being treated for Monkeypox on 12/04/2020.  Patient already had an appointment with [place] today, so kept his appointment.  R. Spruill, RN counseled patient and enc to follow up with ID provider as directed.  I did not see patient today.

## 2020-12-08 ENCOUNTER — Telehealth: Payer: Self-pay

## 2020-12-08 LAB — FLUORESCENT TREPONEMAL AB(FTA)-IGG-BLD: Fluorescent Treponemal ABS: REACTIVE — AB

## 2020-12-08 LAB — MONKEYPOX VIRUS DNA, QUALITATIVE REAL-TIME PCR
MONKEYPOX VIRUS DNA, QL PCR: DETECTED — CR
Orthopoxvirus DNA, QL PCR: DETECTED — CR

## 2020-12-08 LAB — RPR TITER: RPR Titer: 1:4 {titer} — ABNORMAL HIGH

## 2020-12-08 LAB — RPR: RPR Ser Ql: REACTIVE — AB

## 2020-12-08 LAB — HIV ANTIBODY (ROUTINE TESTING W REFLEX): HIV 1&2 Ab, 4th Generation: NONREACTIVE

## 2020-12-08 NOTE — Telephone Encounter (Signed)
Received call from [facility] at 10:55am with urgent lab results.  MMN:[id number] Virus DNA Results: deteted Final result  Will notify MD of results. Patient currently being treated for suspected, now confirmed Monkeypox with TPOXX. Tony Franco

## 2020-12-08 NOTE — Telephone Encounter (Signed)
Patient made aware of results. Also advise to remain in quarantine.  Tony Franco

## 2020-12-10 DIAGNOSIS — B04 Monkeypox: Secondary | ICD-10-CM | POA: Diagnosis not present

## 2020-12-14 LAB — HERPES SIMPLEX VIRUS CULTURE W/RFLX TO TYPING

## 2020-12-14 LAB — VIRAL CULTURE VIRC

## 2020-12-14 LAB — VARICELLA ZOSTER VIRUS,RAPID METHOD,CULTURE

## 2020-12-23 DIAGNOSIS — E782 Mixed hyperlipidemia: Secondary | ICD-10-CM | POA: Diagnosis not present

## 2020-12-23 DIAGNOSIS — F39 Unspecified mood [affective] disorder: Secondary | ICD-10-CM | POA: Diagnosis not present

## 2020-12-23 DIAGNOSIS — Z0000 Encounter for general adult medical examination without abnormal findings: Secondary | ICD-10-CM | POA: Diagnosis not present

## 2020-12-23 DIAGNOSIS — R7303 Prediabetes: Secondary | ICD-10-CM | POA: Diagnosis not present

## 2021-01-20 ENCOUNTER — Other Ambulatory Visit: Payer: Self-pay

## 2021-01-20 ENCOUNTER — Emergency Department: Payer: Medicare Other

## 2021-01-20 ENCOUNTER — Inpatient Hospital Stay
Admission: EM | Admit: 2021-01-20 | Discharge: 2021-01-22 | DRG: 389 | Disposition: A | Discharge status: Home / Self Care | Payer: Medicare Other | Attending: Family Medicine | Admitting: Family Medicine

## 2021-01-20 ENCOUNTER — Inpatient Hospital Stay: Payer: Medicare Other

## 2021-01-20 DIAGNOSIS — Z888 Allergy status to other drugs, medicaments and biological substances status: Secondary | ICD-10-CM

## 2021-01-20 DIAGNOSIS — J984 Other disorders of lung: Secondary | ICD-10-CM | POA: Diagnosis not present

## 2021-01-20 DIAGNOSIS — M5137 Other intervertebral disc degeneration, lumbosacral region: Secondary | ICD-10-CM | POA: Diagnosis not present

## 2021-01-20 DIAGNOSIS — G2581 Restless legs syndrome: Secondary | ICD-10-CM | POA: Diagnosis present

## 2021-01-20 DIAGNOSIS — Z79899 Other long term (current) drug therapy: Secondary | ICD-10-CM | POA: Diagnosis not present

## 2021-01-20 DIAGNOSIS — M5136 Other intervertebral disc degeneration, lumbar region: Secondary | ICD-10-CM | POA: Diagnosis present

## 2021-01-20 DIAGNOSIS — G4700 Insomnia, unspecified: Secondary | ICD-10-CM | POA: Diagnosis present

## 2021-01-20 DIAGNOSIS — M47816 Spondylosis without myelopathy or radiculopathy, lumbar region: Secondary | ICD-10-CM | POA: Diagnosis present

## 2021-01-20 DIAGNOSIS — I2510 Atherosclerotic heart disease of native coronary artery without angina pectoris: Secondary | ICD-10-CM | POA: Diagnosis not present

## 2021-01-20 DIAGNOSIS — I700 Atherosclerosis of aorta: Secondary | ICD-10-CM | POA: Diagnosis not present

## 2021-01-20 DIAGNOSIS — I10 Essential (primary) hypertension: Secondary | ICD-10-CM | POA: Diagnosis present

## 2021-01-20 DIAGNOSIS — R109 Unspecified abdominal pain: Secondary | ICD-10-CM | POA: Diagnosis not present

## 2021-01-20 DIAGNOSIS — R188 Other ascites: Secondary | ICD-10-CM | POA: Diagnosis present

## 2021-01-20 DIAGNOSIS — F32A Depression, unspecified: Secondary | ICD-10-CM | POA: Diagnosis present

## 2021-01-20 DIAGNOSIS — Z8249 Family history of ischemic heart disease and other diseases of the circulatory system: Secondary | ICD-10-CM | POA: Diagnosis not present

## 2021-01-20 DIAGNOSIS — K56600 Partial intestinal obstruction, unspecified as to cause: Secondary | ICD-10-CM | POA: Diagnosis not present

## 2021-01-20 DIAGNOSIS — R9389 Abnormal findings on diagnostic imaging of other specified body structures: Secondary | ICD-10-CM

## 2021-01-20 DIAGNOSIS — N401 Enlarged prostate with lower urinary tract symptoms: Secondary | ICD-10-CM | POA: Diagnosis present

## 2021-01-20 DIAGNOSIS — K56609 Unspecified intestinal obstruction, unspecified as to partial versus complete obstruction: Secondary | ICD-10-CM | POA: Diagnosis not present

## 2021-01-20 DIAGNOSIS — Z4682 Encounter for fitting and adjustment of non-vascular catheter: Secondary | ICD-10-CM | POA: Diagnosis not present

## 2021-01-20 DIAGNOSIS — N3289 Other specified disorders of bladder: Secondary | ICD-10-CM | POA: Diagnosis not present

## 2021-01-20 DIAGNOSIS — Z978 Presence of other specified devices: Secondary | ICD-10-CM

## 2021-01-20 DIAGNOSIS — N529 Male erectile dysfunction, unspecified: Secondary | ICD-10-CM | POA: Diagnosis present

## 2021-01-20 DIAGNOSIS — R0789 Other chest pain: Secondary | ICD-10-CM | POA: Diagnosis not present

## 2021-01-20 DIAGNOSIS — Z87891 Personal history of nicotine dependence: Secondary | ICD-10-CM

## 2021-01-20 DIAGNOSIS — Z20822 Contact with and (suspected) exposure to covid-19: Secondary | ICD-10-CM | POA: Diagnosis present

## 2021-01-20 DIAGNOSIS — Z8673 Personal history of transient ischemic attack (TIA), and cerebral infarction without residual deficits: Secondary | ICD-10-CM

## 2021-01-20 DIAGNOSIS — M545 Low back pain, unspecified: Secondary | ICD-10-CM | POA: Diagnosis not present

## 2021-01-20 DIAGNOSIS — K3189 Other diseases of stomach and duodenum: Secondary | ICD-10-CM | POA: Diagnosis not present

## 2021-01-20 DIAGNOSIS — N138 Other obstructive and reflux uropathy: Secondary | ICD-10-CM | POA: Diagnosis present

## 2021-01-20 DIAGNOSIS — M4316 Spondylolisthesis, lumbar region: Secondary | ICD-10-CM | POA: Diagnosis present

## 2021-01-20 DIAGNOSIS — Z0189 Encounter for other specified special examinations: Secondary | ICD-10-CM

## 2021-01-20 DIAGNOSIS — R197 Diarrhea, unspecified: Secondary | ICD-10-CM | POA: Diagnosis not present

## 2021-01-20 DIAGNOSIS — K562 Volvulus: Secondary | ICD-10-CM | POA: Diagnosis not present

## 2021-01-20 DIAGNOSIS — R0602 Shortness of breath: Secondary | ICD-10-CM | POA: Diagnosis not present

## 2021-01-20 DIAGNOSIS — K6389 Other specified diseases of intestine: Secondary | ICD-10-CM | POA: Diagnosis not present

## 2021-01-20 LAB — CBC
HCT: 45.2 % (ref 39.0–52.0)
Hemoglobin: 15.2 g/dL (ref 13.0–17.0)
MCH: 30.6 pg (ref 26.0–34.0)
MCHC: 33.6 g/dL (ref 30.0–36.0)
MCV: 90.9 fL (ref 80.0–100.0)
Platelets: 236 10*3/uL (ref 150–400)
RBC: 4.97 MIL/uL (ref 4.22–5.81)
RDW: 12.3 % (ref 11.5–15.5)
WBC: 6.6 10*3/uL (ref 4.0–10.5)
nRBC: 0 % (ref 0.0–0.2)

## 2021-01-20 LAB — BASIC METABOLIC PANEL
Anion gap: 6 (ref 5–15)
BUN: 14 mg/dL (ref 8–23)
CO2: 27 mmol/L (ref 22–32)
Calcium: 9 mg/dL (ref 8.9–10.3)
Chloride: 107 mmol/L (ref 98–111)
Creatinine, Ser: 0.83 mg/dL (ref 0.61–1.24)
GFR, Estimated: >60 mL/min (ref >60)
Glucose, Bld: 101 mg/dL — ABNORMAL HIGH (ref 70–99)
Potassium: 4.1 mmol/L (ref 3.5–5.1)
Sodium: 140 mmol/L (ref 135–145)

## 2021-01-20 LAB — TROPONIN I (HIGH SENSITIVITY)
Troponin I (High Sensitivity): 2 ng/L (ref <18)
Troponin I (High Sensitivity): 3 ng/L (ref <18)

## 2021-01-20 LAB — HEPATIC FUNCTION PANEL
ALT: 16 U/L (ref 0–44)
AST: 17 U/L (ref 15–41)
Albumin: 4.2 g/dL (ref 3.5–5.0)
Alkaline Phosphatase: 82 U/L (ref 38–126)
Bilirubin, Direct: 0.1 mg/dL (ref 0.0–0.2)
Indirect Bilirubin: 0.9 mg/dL (ref 0.3–0.9)
Total Bilirubin: 1 mg/dL (ref 0.3–1.2)
Total Protein: 7 g/dL (ref 6.5–8.1)

## 2021-01-20 LAB — RESP PANEL BY RT-PCR (FLU A&B, COVID) ARPGX2
Influenza A by PCR: NEGATIVE
Influenza B by PCR: NEGATIVE
SARS Coronavirus 2 by RT PCR: NEGATIVE

## 2021-01-20 MED ORDER — ONDANSETRON HCL 4 MG/2ML IJ SOLN
4.0000 mg | Freq: Four times a day (QID) | INTRAMUSCULAR | Status: DC | PRN
  Administered 2021-01-20: 17:00:00 4 mg via INTRAVENOUS
  Filled 2021-01-20: qty 2

## 2021-01-20 MED ORDER — MORPHINE SULFATE (PF) 2 MG/ML IV SOLN
2.0000 mg | INTRAVENOUS | Status: DC | PRN
  Administered 2021-01-20 – 2021-01-21 (×7): 2 mg via INTRAVENOUS
  Filled 2021-01-20 (×7): qty 1

## 2021-01-20 MED ORDER — DICYCLOMINE HCL 10 MG/5ML PO SOLN
10.0000 mg | Freq: Once | ORAL | Status: AC
  Administered 2021-01-20: 16:00:00 10 mg via ORAL
  Filled 2021-01-20 (×3): qty 5

## 2021-01-20 MED ORDER — ENOXAPARIN SODIUM 40 MG/0.4ML IJ SOSY
40.0000 mg | PREFILLED_SYRINGE | INTRAMUSCULAR | Status: DC
  Administered 2021-01-20 – 2021-01-21 (×2): 40 mg via SUBCUTANEOUS
  Filled 2021-01-20 (×2): qty 0.4

## 2021-01-20 MED ORDER — ACETAMINOPHEN 650 MG RE SUPP: 650.0000 mg | Freq: Four times a day (QID) | RECTAL | Status: DC | PRN

## 2021-01-20 MED ORDER — ALUM & MAG HYDROXIDE-SIMETH 200-200-20 MG/5ML PO SUSP
30.0000 mL | Freq: Once | ORAL | Status: AC
  Administered 2021-01-20: 14:00:00 30 mL via ORAL
  Filled 2021-01-20: qty 30

## 2021-01-20 MED ORDER — ACETAMINOPHEN 325 MG PO TABS: 650.0000 mg | ORAL_TABLET | Freq: Four times a day (QID) | ORAL | Status: DC | PRN

## 2021-01-20 MED ORDER — SODIUM CHLORIDE 0.9 % IV SOLN: INTRAVENOUS | Status: DC

## 2021-01-20 MED ORDER — DIATRIZOATE MEGLUMINE & SODIUM 66-10 % PO SOLN
90.0000 mL | Freq: Once | ORAL | Status: AC
  Administered 2021-01-20: 21:00:00 90 mL via NASOGASTRIC

## 2021-01-20 MED ORDER — LIDOCAINE VISCOUS HCL 2 % MT SOLN
15.0000 mL | Freq: Once | OROMUCOSAL | Status: AC
  Administered 2021-01-20: 14:00:00 15 mL via ORAL
  Filled 2021-01-20: qty 15

## 2021-01-20 MED ORDER — ONDANSETRON HCL 4 MG PO TABS: 4.0000 mg | ORAL_TABLET | Freq: Four times a day (QID) | ORAL | Status: DC | PRN

## 2021-01-20 MED ORDER — IOHEXOL 300 MG/ML  SOLN
100.0000 mL | Freq: Once | INTRAMUSCULAR | Status: AC | PRN
  Administered 2021-01-20: 15:00:00 100 mL via INTRAVENOUS
  Filled 2021-01-20: qty 100

## 2021-01-20 NOTE — ED Provider Notes (Signed)
[facility] Emergency Department Provider Note   ____________________________________________   Event Date/Time   First MD Initiated Contact with Patient 01/20/21 1315     (approximate)  I have reviewed the triage vital signs and the nursing notes.   HISTORY  Chief Complaint Chest Pain   HPI Tony Franco is a 70 y.o. male who in triage reported intermittent chest pressure and left side with no radiation without short of breath more than usual after his usual 3 mile walk.  He also had upper abdominal pain for 2 days and diarrhea with some nausea and low back pain.  Patient tells me that he has in the past had some pain across his chest which is always been diagnosed as muscle spasm.  He has had some epigastric pain for the last couple days and last night it was very bad kept him from sleeping well he was tossing and turning.  Really does not radiate elsewhere but it is a kind of a deep burning pain.  He is also had low back pain for many weeks.  He has seen the chiropractor for that is not really doing much yet.  He is not taking any Advil or Naprosyn or other medicines like that.  He is coughing somewhat but not much.  He does not have pleuritic pain.         Past Medical History:  Diagnosis Date   Depression    Stroke [facility])    Syphilis    TIA (transient ischemic attack)    Vertigo     Patient Active Problem List   Diagnosis Date Noted   Neurosyphilis 05/11/2020   H/O vertigo 03/12/2020   Severe recurrent major depression without psychotic features ([place]) 12/13/2019   Symptoms of depression 05/30/2019   BPH with obstruction/lower urinary tract symptoms 06/22/2017   Mixed hyperlipidemia 06/20/2017   Right lateral epicondylitis 08/04/2016   Screening for prostate cancer 08/04/2016   Osteoarthritis of knees, bilateral 02/11/2016   ED (erectile dysfunction) 04/20/2015   Insomnia 04/20/2015   Paresthesia 03/16/2015   RLS (restless legs syndrome)  03/16/2015   Colon cancer screening 03/16/2015    Past Surgical History:  Procedure Laterality Date   COLONOSCOPY WITH PROPOFOL N/A 04/28/2015   Procedure: COLONOSCOPY WITH PROPOFOL;  Surgeon: Christene Lye, MD;  Location: [facility] ENDOSCOPY;  Service: Endoscopy;  Laterality: N/A;   COLONOSCOPY WITH PROPOFOL N/A 09/09/2019   Procedure: COLONOSCOPY WITH PROPOFOL;  Surgeon: Lesly Rubenstein, MD;  Location: [facility] ENDOSCOPY;  Service: Endoscopy;  Laterality: N/A;   ESOPHAGOGASTRODUODENOSCOPY (EGD) WITH PROPOFOL N/A 09/09/2019   Procedure: ESOPHAGOGASTRODUODENOSCOPY (EGD) WITH PROPOFOL;  Surgeon: Lesly Rubenstein, MD;  Location: [facility] ENDOSCOPY;  Service: Endoscopy;  Laterality: N/A;   KNEE ARTHROSCOPY     left knee    Prior to Admission medications   Medication Sig Start Date End Date Taking? Authorizing Provider  albuterol (VENTOLIN HFA) 108 (90 Base) MCG/ACT inhaler Inhale 2 puffs into the lungs every 4 (four) hours as needed for wheezing or shortness of breath (cough). Patient not taking: No sig reported 06/13/19   Olin Hauser, DO  buPROPion [facility] SR) 100 MG 12 hr tablet Take 100 mg by mouth every morning. 03/06/20   [provider]  DESCOVY 200-25 MG tablet Take 1 tablet by mouth daily. 07/22/20   [provider]  diclofenac Sodium (VOLTAREN) 1 % GEL Apply 2 g topically 4 (four) times daily as needed (arthritis knee pain). Patient not taking: No sig  reported 10/17/19   Olin Hauser, DO  diltiazem 2 % GEL Apply 1 application topically 3 (three) times daily. For 1 week as needed. Patient not taking: No sig reported 02/20/20   Olin Hauser, DO  fexofenadine (ALLEGRA) 180 MG tablet Take 180 mg by mouth daily.  Patient not taking: Reported on 04/27/2020    [provider]  fluticasone (FLONASE) 50 MCG/ACT nasal spray Place into both nostrils. Patient not taking: No sig reported 03/18/20   [provider]  hydrocortisone  (ANUSOL-HC) 2.5 % rectal cream Apply topically 2 (two) times daily. Patient not taking: Reported on 04/27/2020 03/03/20   [provider]  hydrOXYzine (ATARAX/VISTARIL) 25 MG tablet Take 1 tablet (25 mg total) by mouth every 6 (six) hours as needed for itching. Patient not taking: No sig reported 02/22/20   Johnn Hai, PA-C  meclizine (ANTIVERT) 25 MG tablet Take 1 tablet (25 mg total) by mouth 2 (two) times daily as needed for dizziness. Patient not taking: No sig reported 03/02/20   Merlyn Lot, MD  mirtazapine (REMERON) 30 MG tablet Take 1 tablet (30 mg total) by mouth at bedtime. Patient not taking: Reported on 04/27/2020 02/13/20   Olin Hauser, DO  mupirocin cream (BACTROBAN) 2 % Apply 1 application topically 2 (two) times daily as needed. Toipcal antibiotic. For skin sores. For 1 week as needed Patient not taking: No sig reported 02/20/20   Olin Hauser, DO  predniSONE (STERAPRED UNI-PAK 48 TAB) 10 MG (48) TBPK tablet Take by mouth. Patient not taking: No sig reported 03/19/20   [provider]  promethazine (PHENERGAN) 12.5 MG tablet Take 1 tablet (12.5 mg total) by mouth every 6 (six) hours as needed. Patient not taking: No sig reported 03/02/20   Merlyn Lot, MD  sildenafil (REVATIO) 20 MG tablet Take 1-5 pills about 30 min prior to sex. Start with 1 and increase as needed. Patient not taking: No sig reported 11/27/18   Olin Hauser, DO  tamsulosin [facility]) 0.4 MG CAPS capsule Take 1 capsule by mouth once daily Patient not taking: Reported on 08/07/2020 10/27/19   Olin Hauser, DO  triamcinolone cream (KENALOG) 0.5 % Apply 1 application topically 2 (two) times daily. Topical steroid. To affected areas, for up to 2 weeks for bites Patient not taking: Reported on 04/27/2020 02/20/20   Olin Hauser, DO  vitamin B-12 (CYANOCOBALAMIN) 1000 MCG tablet Take 1,000 mcg by mouth daily.    [provider]   zolpidem (AMBIEN CR) 6.25 MG CR tablet Take 1 tablet (6.25 mg total) by mouth at bedtime as needed for sleep. Patient not taking: Reported on 08/07/2020 04/07/20   Olin Hauser, DO    Allergies Mirtazapine  Family History  Problem Relation Age of Onset   Heart attack Mother    Heart attack Father     Social History Social History   Tobacco Use   Smoking status: Former    Types: Cigarettes   Smokeless tobacco: Never  Vaping Use   Vaping Use: Never used  Substance Use Topics   Alcohol use: Not Currently    Comment: occ   Drug use: Yes    Types: Marijuana, Amphetamines, IV    Comment: patient states that last use was one week ago    Review of Systems  Constitutional: No fever/chills Eyes: No visual changes. ENT: No sore throat. Cardiovascular: Denies chest pain. Respiratory: Denies shortness of breath. Gastrointestinal: No abdominal pain.  No nausea, no  vomiting.  No diarrhea.  No constipation. Genitourinary: Negative for dysuria. Musculoskeletal: Negative for back pain. Skin: Negative for rash. Neurological: Negative for headaches, focal weakness or numbness.   ____________________________________________   PHYSICAL EXAM:  VITAL SIGNS: ED Triage Vitals  Enc Vitals Group     BP 01/20/21 1153 (!) 134/94     Pulse Rate 01/20/21 1153 77     Resp 01/20/21 1153 17     Temp 01/20/21 1153 98.1 F (36.7 C)     Temp Source 01/20/21 1153 Oral     SpO2 01/20/21 1153 97 %     Weight --      Height --      Head Circumference --      Peak Flow --      Pain Score 01/20/21 1154 4     Pain Loc --      Pain Edu? --      Excl. in [place]? --    Constitutional: Alert and oriented. Well appearing and in no acute distress. Eyes: Conjunctivae are normal.  Head: Atraumatic. Nose: No congestion/rhinnorhea. Mouth/Throat: Mucous membranes are moist.  Oropharynx non-erythematous. Neck: No stridor.   Cardiovascular: Normal rate, regular rhythm. Grossly normal heart  sounds.  Good peripheral circulation. Respiratory: Normal respiratory effort.  No retractions. Lungs CTAB. Gastrointestinal: Soft tender to palpation in the epigastric area.  Palpation there reproduces his pain.  Palpation in the right lower quadrant and suprapubically also causes epigastric pain.  He is not tender in the right lower quadrant or epigastric area though.  No distention. No abdominal bruits. No CVA tenderness. Musculoskeletal: No lower extremity tenderness nor edema.  Back is not tender to palpation or percussion. Neurologic:  Normal speech and language. No gross focal neurologic deficits are appreciated. No gait instability. Skin:  Skin is warm, dry and intact. No rash noted. Psychiatric: Mood and affect are normal. Speech and behavior are normal.  ____________________________________________   LABS (all labs ordered are listed, but only abnormal results are displayed)  Labs Reviewed  BASIC METABOLIC PANEL - Abnormal; Notable for the following components:      Result Value   Glucose, Bld 101 (*)    All other components within normal limits  RESP PANEL BY RT-PCR (FLU A&B, COVID) ARPGX2  CBC  HEPATIC FUNCTION PANEL  TROPONIN I (HIGH SENSITIVITY)  TROPONIN I (HIGH SENSITIVITY)   ____________________________________________  EKG  EKG read interpreted by me shows normal sinus rhythm at 77 some PACs are present normal axis no acute ST-T wave changes. ____________________________________________  RADIOLOGY Gertha Calkin, personally viewed and evaluated these images (plain radiographs) as part of my medical decision making, as well as reviewing the written report by the radiologist.  ED MD interpretation: Chest x-ray read by radiology reviewed by me shows a possible 1 cm density in the right upper lobe.  Radiology recommends CT.  I reviewed the film and agree there is a possible density there. CT chest read by radiology reviewed by me is negative CT abdomen read by  radiology reviewed by me shows partial or developing small bowel obstruction. Official radiology report(s): DG Chest 2 View  Result Date: 01/20/2021 CLINICAL DATA:  Chest, back and upper abdominal pain over the last 2 days. Some shortness of breath. EXAM: CHEST - 2 VIEW COMPARISON:  03/02/2020 FINDINGS: Heart size is normal. Mediastinal shadows are normal. The pulmonary vascularity is normal. No pleural effusions. Old healed rib fractures in the right upper chest. Question 1 cm focal density  in the right upper lobe could be a mass or nodule. Chest CT recommended to further evaluate. IMPRESSION: Question 1 cm mass or nodule in the lateral right upper lobe. CT recommended for further evaluation. Otherwise no active disease. Electronically Signed   By: Nelson Chimes M.D.   On: 01/20/2021 12:47   DG Lumbar Spine 2-3 Views  Result Date: 01/20/2021 CLINICAL DATA:  low back pain x many weeks EXAM: LUMBAR SPINE - 2-3 VIEW COMPARISON:  None. FINDINGS: Very mild height loss of T12 and L1 is favored physiologic. Otherwise, no vertebral body height loss to suggest acute fracture. Mild (grade 1) anterolisthesis of L4 on L5, likely degenerative given facet arthropathy at this level. There also is facet arthropathy at L5-S1. Moderate to severe degenerative height loss at L5-S1 with endplate sclerosis. Calcific atherosclerosis of the aorta. IMPRESSION: 1. No radiographic evidence of acute fracture or traumatic malalignment. Cross-sectional imaging could provide more sensitive evaluation if clinically indicated. 2. Moderate to severe degenerative disc disease at L5-S1 and lower lumbar facet arthropathy and facet mediated mild (grade 1) anterolisthesis of L4 on L5. Electronically Signed   By: Margaretha Sheffield M.D.   On: 01/20/2021 14:27   CT CHEST ABDOMEN PELVIS W CONTRAST  Result Date: 01/20/2021 CLINICAL DATA:  Chest pain and abdominal pain radiating to back for several days, nausea and diarrhea EXAM: CT CHEST,  ABDOMEN, AND PELVIS WITH CONTRAST TECHNIQUE: Multidetector CT imaging of the chest, abdomen and pelvis was performed following the standard protocol during bolus administration of intravenous contrast. CONTRAST:  156mL OMNIPAQUE IOHEXOL 300 MG/ML  SOLN COMPARISON:  None. FINDINGS: CT CHEST FINDINGS Cardiovascular: Aortic atherosclerosis. Normal heart size. Scattered left and right coronary artery calcifications. No pericardial effusion. Mediastinum/Nodes: No enlarged mediastinal, hilar, or axillary lymph nodes. Thyroid gland, trachea, and esophagus demonstrate no significant findings. Lungs/Pleura: Bandlike scarring of the bilateral lung bases. No pleural effusion or pneumothorax. Musculoskeletal: No chest wall mass or suspicious bone lesions identified. CT ABDOMEN PELVIS FINDINGS Hepatobiliary: No solid liver abnormality is seen. No gallstones, gallbladder wall thickening, or biliary dilatation. Pancreas: Unremarkable. No pancreatic ductal dilatation or surrounding inflammatory changes. Spleen: Normal in size without significant abnormality. Adrenals/Urinary Tract: Adrenal glands are unremarkable. Kidneys are normal, without renal calculi, solid lesion, or hydronephrosis. Bladder is unremarkable. Stomach/Bowel: Stomach is within normal limits. Appendix appears normal. There are multiple mildly distended, fluid-filled loops of distal small bowel in the central abdomen measuring up to 3.9 cm in caliber, some loops fecalized. There are tightly knuckled loops of distal ileum in the low midline abdomen (series 2, image 100, 96). Vascular/Lymphatic: Aortic atherosclerosis. No enlarged abdominal or pelvic lymph nodes. Reproductive: No mass or other abnormality. Other: No abdominal wall hernia or abnormality. Small volume ascites in the pelvis (series 2, image 110). Musculoskeletal: No acute or significant osseous findings. IMPRESSION: 1. There are multiple mildly distended, fluid-filled loops of distal small bowel in the  central abdomen measuring up to 3.9 cm in caliber, some loops fecalized. There are tightly knuckled loops of distal ileum in the low midline abdomen. Findings are consistent with developing or partial small bowel obstruction. There is gas and stool present in the colon to the rectum. 2. Small volume ascites in the pelvis, likely reactive. 3. Coronary artery disease. Aortic Atherosclerosis (ICD10-I70.0). Electronically Signed   By: Delanna Ahmadi M.D.   On: 01/20/2021 15:20    ____________________________________________   PROCEDURES  Procedure(s) performed (including Critical Care): Critical care time 20 minutes.  This includes reviewing the patient's  old history in detail asking him about the 1-4 positive RPR and reviewing the CTs x-rays and lab work discussing with the surgeon and the hospitalist.  Procedures   ____________________________________________   INITIAL IMPRESSION / [place] / ED COURSE   CT of the chest is negative but CT of the abdomen shows a developing small bowel obstruction.  We will get the department in the hospital.  I discussed him with Dr. Peyton Najjar the surgeon who asked for an NG tube..  I have paged the hospitalist.  GI cocktail course did not do anything.          ____________________________________________   FINAL CLINICAL IMPRESSION(S) / ED DIAGNOSES  Final diagnoses:  Small bowel obstruction [facility])     ED Discharge Orders     None        Note:  This document was prepared using Dragon voice recognition software and may include unintentional dictation errors.    Nena Polio, MD 01/20/21 [phone]

## 2021-01-20 NOTE — ED Notes (Addendum)
Pt. Returned from xray 

## 2021-01-20 NOTE — ED Provider Notes (Signed)
Emergency Medicine Provider Triage Evaluation Note  Tony Franco , a 70 y.o. male  was evaluated in triage.  Pt complains of chest, back, and upper abdominal pain x 2 days. He has also had diarrhea and nausea. No cardiac history. No alleviating measures prior to arrival.   Review of Systems  Positive: Abdominal, chest, back pain Negative: Fever  Physical Exam  BP (!) 134/94    Pulse 77    Temp 98.1 F (36.7 C) (Oral)    Resp 17    SpO2 97%  Gen:   Awake, no distress   Resp:  Normal effort  MSK:   Moves extremities without difficulty  Other:    Medical Decision Making  Cardiac protocol initiated. COVID/influenza swab sent.  Medically screening exam initiated at 11:54 AM.  Appropriate orders placed.  ERWIN NISHIYAMA was informed that the remainder of the evaluation will be completed by another provider, this initial triage assessment does not replace that evaluation, and the importance of remaining in the ED until their evaluation is complete.   Victorino Dike, FNP 01/20/21 1159    Arta Silence, MD 01/20/21 1317

## 2021-01-20 NOTE — ED Notes (Signed)
Hospitalist and GI at bedside

## 2021-01-20 NOTE — Consult Note (Signed)
SURGICAL CONSULTATION NOTE   HISTORY OF PRESENT ILLNESS (HPI):  70 y.o. male presented to [facility] ED for evaluation of chest pain and abdominal pain. Patient reports he has been having chest pain and burning on the abdomen for the last 2 days.  He endorsed that the pain has intensified today.  He denies nausea or vomiting.  The pain is in the epigastric area.  The pain does not radiate to the probably body.  Patient cannot identify any alleviating or aggravating factors.  Patient endorses having multiple episode of diarrhea in the last 2 or 3 days.  Last bowel movement was this morning.  Of note patient endorses that he has been had multiple intestinal infection during the last year.  He has also had gonorrhea and syphilis.  He was also treated for neurosyphilis with vertigo after he was diagnosed with syphilis.  He has had multiple courses of antibiotic due to multiple infection as per patient.  At the ED he was found with normal white blood cell count.  No significant electrolyte disturbance.  CT scan of the abdomen shows dilated loop of small bowel.  There was gas in the distal large intestine.  It was concerning for partial small bowel obstruction.  I personally evaluated the images.  Surgery is consulted by Dr. Cinda Quest in this context for evaluation and management of small bowel obstruction.  PAST MEDICAL HISTORY (PMH):  Past Medical History:  Diagnosis Date   Depression    Stroke ([place])    Syphilis    TIA (transient ischemic attack)    Vertigo      PAST SURGICAL HISTORY ([place]):  Past Surgical History:  Procedure Laterality Date   COLONOSCOPY WITH PROPOFOL N/A 04/28/2015   Procedure: COLONOSCOPY WITH PROPOFOL;  Surgeon: Christene Lye, MD;  Location: [facility] ENDOSCOPY;  Service: Endoscopy;  Laterality: N/A;   COLONOSCOPY WITH PROPOFOL N/A 09/09/2019   Procedure: COLONOSCOPY WITH PROPOFOL;  Surgeon: Lesly Rubenstein, MD;  Location: [facility] ENDOSCOPY;  Service: Endoscopy;  Laterality:  N/A;   ESOPHAGOGASTRODUODENOSCOPY (EGD) WITH PROPOFOL N/A 09/09/2019   Procedure: ESOPHAGOGASTRODUODENOSCOPY (EGD) WITH PROPOFOL;  Surgeon: Lesly Rubenstein, MD;  Location: [facility] ENDOSCOPY;  Service: Endoscopy;  Laterality: N/A;   KNEE ARTHROSCOPY     left knee     MEDICATIONS:  Prior to Admission medications   Medication Sig Start Date End Date Taking? Authorizing Provider  buPROPion (WELLBUTRIN SR) 100 MG 12 hr tablet Take 100 mg by mouth every morning. 03/06/20  Yes [provider]  DESCOVY 200-25 MG tablet Take 1 tablet by mouth daily. 07/22/20  Yes [provider]  hydrOXYzine (ATARAX/VISTARIL) 25 MG tablet Take 1 tablet (25 mg total) by mouth every 6 (six) hours as needed for itching. 02/22/20  Yes Johnn Hai, PA-C  tamsulosin [facility]) 0.4 MG CAPS capsule Take 1 capsule by mouth once daily 10/27/19  Yes Karamalegos, Devonne Doughty, DO  vitamin B-12 (CYANOCOBALAMIN) 1000 MCG tablet Take 1,000 mcg by mouth daily.   Yes [provider]  albuterol (VENTOLIN HFA) 108 (90 Base) MCG/ACT inhaler Inhale 2 puffs into the lungs every 4 (four) hours as needed for wheezing or shortness of breath (cough). Patient not taking: Reported on 02/20/2020 06/13/19   Olin Hauser, DO  diclofenac Sodium (VOLTAREN) 1 % GEL Apply 2 g topically 4 (four) times daily as needed (arthritis knee pain). Patient not taking: No sig reported 10/17/19   Olin Hauser, DO  diltiazem 2 % GEL Apply 1 application topically 3 (  three) times daily. For 1 week as needed. Patient not taking: No sig reported 02/20/20   Olin Hauser, DO  fexofenadine (ALLEGRA) 180 MG tablet Take 180 mg by mouth daily.  Patient not taking: Reported on 04/27/2020    [provider]  fluticasone (FLONASE) 50 MCG/ACT nasal spray Place into both nostrils. Patient not taking: No sig reported 03/18/20   [provider]  hydrocortisone (ANUSOL-HC) 2.5 % rectal cream Apply topically 2  (two) times daily. Patient not taking: Reported on 04/27/2020 03/03/20   [provider]  meclizine (ANTIVERT) 25 MG tablet Take 1 tablet (25 mg total) by mouth 2 (two) times daily as needed for dizziness. Patient not taking: Reported on 04/27/2020 03/02/20   Merlyn Lot, MD  mirtazapine (REMERON) 30 MG tablet Take 1 tablet (30 mg total) by mouth at bedtime. Patient not taking: Reported on 04/27/2020 02/13/20   Olin Hauser, DO  mupirocin cream (BACTROBAN) 2 % Apply 1 application topically 2 (two) times daily as needed. Toipcal antibiotic. For skin sores. For 1 week as needed Patient not taking: No sig reported 02/20/20   Olin Hauser, DO  predniSONE (STERAPRED UNI-PAK 48 TAB) 10 MG (48) TBPK tablet Take by mouth. Patient not taking: No sig reported 03/19/20   [provider]  promethazine (PHENERGAN) 12.5 MG tablet Take 1 tablet (12.5 mg total) by mouth every 6 (six) hours as needed. Patient not taking: No sig reported 03/02/20   Merlyn Lot, MD  sildenafil (REVATIO) 20 MG tablet Take 1-5 pills about 30 min prior to sex. Start with 1 and increase as needed. Patient not taking: Reported on 04/27/2020 11/27/18   Olin Hauser, DO  triamcinolone cream (KENALOG) 0.5 % Apply 1 application topically 2 (two) times daily. Topical steroid. To affected areas, for up to 2 weeks for bites Patient not taking: Reported on 04/27/2020 02/20/20   Olin Hauser, DO  zolpidem (AMBIEN CR) 6.25 MG CR tablet Take 1 tablet (6.25 mg total) by mouth at bedtime as needed for sleep. Patient not taking: Reported on 08/07/2020 04/07/20   Olin Hauser, DO     ALLERGIES:  Allergies  Allergen Reactions   Mirtazapine Other (See Comments)    Patient reports serotonin toxicity      SOCIAL HISTORY:  Social History   Socioeconomic History   Marital status: Single    Spouse name: No   Number of children: 0   Years of education: 18   Highest  education level: Master's degree (e.g., MA, MS, MEng, MEd, MSW, MBA)  Occupational History   Not on file  Tobacco Use   Smoking status: Former    Types: Cigarettes   Smokeless tobacco: Never  Vaping Use   Vaping Use: Never used  Substance and Sexual Activity   Alcohol use: Not Currently    Comment: occ   Drug use: Yes    Types: Marijuana, Amphetamines, IV    Comment: patient states that last use was one week ago   Sexual activity: Yes    Partners: Male  Other Topics Concern   Not on file  Social History Narrative   Patient identifies as a gay man. He currently has a "housemate" who he has dated and would like to be in a relationship with. He works part-time and expresses that he enjoys his work. He also reports that he likes to take care of others and enjoys renovating homes.     Social Determinants of Health   Financial  Resource Strain: Not on file  Food Insecurity: Not on file  Transportation Needs: Not on file  Physical Activity: Not on file  Stress: Not on file  Social Connections: Not on file  Intimate Partner Violence: Not on file      FAMILY HISTORY:  Family History  Problem Relation Age of Onset   Heart attack Mother    Heart attack Father      REVIEW OF SYSTEMS:  Constitutional: denies weight loss, fever, chills, or sweats  Eyes: denies any other vision changes, history of eye injury  ENT: denies sore throat, hearing problems  Respiratory: denies shortness of breath, wheezing  Cardiovascular: denies chest pain, palpitations  Gastrointestinal: Positive abdominal pain Genitourinary: denies burning with urination or urinary frequency Musculoskeletal: denies any other joint pains or cramps  Skin: denies any other rashes or skin discolorations  Neurological: denies any other headache, dizziness, weakness  Psychiatric: denies any other depression, anxiety   All other review of systems were negative   VITAL SIGNS:  Temp:  [98.1 F (36.7 C)] 98.1 F (36.7  C) (12/14 1153) Pulse Rate:  [77-82] 80 (12/14 1445) Resp:  [16-17] 16 (12/14 1306) BP: (131-134)/(82-94) 131/82 (12/14 1306) SpO2:  [95 %-100 %] 95 % (12/14 1445)             INTAKE/OUTPUT:  This shift: No intake/output data recorded.  Last 2 shifts: @IOLAST2SHIFTS @   PHYSICAL EXAM:  Constitutional:  -- Normal body habitus  -- Awake, alert, and oriented x3  Eyes:  -- Pupils equally round and reactive to light  -- No scleral icterus  Ear, nose, and throat:  -- No jugular venous distension  Pulmonary:  -- No crackles  -- Equal breath sounds bilaterally -- Breathing non-labored at rest Cardiovascular:  -- S1, S2 present  -- No pericardial rubs Gastrointestinal:  -- Abdomen soft, mild tender in mid abdomen, mild-distended, no guarding or rebound tenderness -- No abdominal masses appreciated, pulsatile or otherwise  Musculoskeletal and Integumentary:  -- Wounds: None appreciated -- Extremities: B/L UE and LE FROM, hands and feet warm, no edema  Neurologic:  -- Motor function: intact and symmetric -- Sensation: intact and symmetric   Labs:  CBC Latest Ref Rng & Units 01/20/2021 10/06/2020 03/02/2020  WBC 4.0 - 10.5 K/uL 6.6 11.0(H) 7.3  Hemoglobin 13.0 - 17.0 g/dL 15.2 13.6 13.2  Hematocrit 39.0 - 52.0 % 45.2 40.0 41.1  Platelets 150 - 400 K/uL 236 270 296   CMP Latest Ref Rng & Units 01/20/2021 10/06/2020 03/02/2020  Glucose 70 - 99 mg/dL 101(H) 123(H) 141(H)  BUN 8 - 23 mg/dL 14 19 16   Creatinine 0.61 - 1.24 mg/dL 0.83 0.95 0.90  Sodium 135 - 145 mmol/L 140 139 140  Potassium 3.5 - 5.1 mmol/L 4.1 4.3 3.9  Chloride 98 - 111 mmol/L 107 107 106  CO2 22 - 32 mmol/L 27 26 23   Calcium 8.9 - 10.3 mg/dL 9.0 8.9 9.0  Total Protein 6.5 - 8.1 g/dL 7.0 7.0 7.6  Total Bilirubin 0.3 - 1.2 mg/dL 1.0 0.6 0.6  Alkaline Phos 38 - 126 U/L 82 67 100  AST 15 - 41 U/L 17 14(L) 27  ALT 0 - 44 U/L 16 13 24     Imaging studies:  EXAM: CT CHEST, ABDOMEN, AND PELVIS WITH CONTRAST    TECHNIQUE: Multidetector CT imaging of the chest, abdomen and pelvis was performed following the standard protocol during bolus administration of intravenous contrast.   CONTRAST:  182mL OMNIPAQUE IOHEXOL 300  MG/ML  SOLN   COMPARISON:  None.   FINDINGS: CT CHEST FINDINGS   Cardiovascular: Aortic atherosclerosis. Normal heart size. Scattered left and right coronary artery calcifications. No pericardial effusion.   Mediastinum/Nodes: No enlarged mediastinal, hilar, or axillary lymph nodes. Thyroid gland, trachea, and esophagus demonstrate no significant findings.   Lungs/Pleura: Bandlike scarring of the bilateral lung bases. No pleural effusion or pneumothorax.   Musculoskeletal: No chest wall mass or suspicious bone lesions identified.   CT ABDOMEN PELVIS FINDINGS   Hepatobiliary: No solid liver abnormality is seen. No gallstones, gallbladder wall thickening, or biliary dilatation.   Pancreas: Unremarkable. No pancreatic ductal dilatation or surrounding inflammatory changes.   Spleen: Normal in size without significant abnormality.   Adrenals/Urinary Tract: Adrenal glands are unremarkable. Kidneys are normal, without renal calculi, solid lesion, or hydronephrosis. Bladder is unremarkable.   Stomach/Bowel: Stomach is within normal limits. Appendix appears normal. There are multiple mildly distended, fluid-filled loops of distal small bowel in the central abdomen measuring up to 3.9 cm in caliber, some loops fecalized. There are tightly knuckled loops of distal ileum in the low midline abdomen (series 2, image 100, 96).   Vascular/Lymphatic: Aortic atherosclerosis. No enlarged abdominal or pelvic lymph nodes.   Reproductive: No mass or other abnormality.   Other: No abdominal wall hernia or abnormality. Small volume ascites in the pelvis (series 2, image 110).   Musculoskeletal: No acute or significant osseous findings.   IMPRESSION: 1. There are multiple  mildly distended, fluid-filled loops of distal small bowel in the central abdomen measuring up to 3.9 cm in caliber, some loops fecalized. There are tightly knuckled loops of distal ileum in the low midline abdomen. Findings are consistent with developing or partial small bowel obstruction. There is gas and stool present in the colon to the rectum. 2. Small volume ascites in the pelvis, likely reactive. 3. Coronary artery disease.   Aortic Atherosclerosis (ICD10-I70.0).     Electronically Signed   By: Delanna Ahmadi M.D.   On: 01/20/2021 15:20  Assessment/Plan:  70 y.o. male with suspected partial small bowel obstruction, complicated by pertinent comorbidities including major depression disorder, chronic back pain.  Patient with loop of small bowel dilated on CT scan.  With patient history of previous intestinal infections and antibiotic therapies and recent diarrhea I considered that the dilation of the intestine could be enteritis versus bowel obstruction.  There is adequate gas and distally which will be consistent with partial bowel obstruction.  I think the patient will benefit of Gastrografin challenge for further evaluation.  These usually help for edema and enteritis as well.  If Gastrografin reached the large intestine will consider removing NGT and start diet and assess for toleration.  Appreciate hospitalist admission for further management of other possibilities of enteritis such as infectious enteritis.  Since the white blood cell count is normal and nothing that antibiotic therapy is needed at this moment.  We will continue to follow closely with physical exam and imaging.  Agree with IV fluid hydration.  I encouraged the patient to ambulate.   Arnold Long, MD

## 2021-01-20 NOTE — H&P (Signed)
History and Physical    Tony Franco GEX:[id number] DOB: 08/18/1950 DOA: 01/20/2021  PCP: [facility]   Patient coming from:  Home  I have personally briefly reviewed patient's old medical records in [place]  Chief Complaint: Abdominal pain associated with nausea and vomiting  HPI: Tony Franco is a 70 y.o. male with PMH significant for major depression, chronic back pain, muscle spasms presented to the ED with complaints of abdominal pain for 2 days.  Patient reports He started severe abdominal pain, describes mainly in the epigastric region, nonradiating 7 /10 on the pain scale associated with nausea,  denies any vomiting.  Patient reports he could not sleep because of the pain last night, in the morning he went for his scheduled back massage, he felt some better but then has worsening of his pain and started throwing up.  Patient denies any recent travel, unusual food outside.  Patient has been taking ibuprofen, Tylenol without any relief.  ED Course: He was hemodynamically stable except hypertension. HR 80, RR 16, BP 134/94, SPO2 97% on room air, temp 98.1 Labs include sodium 140, potassium 4.1, chloride 107, bicarb 27, glucose 101, BUN 14, creatinine 0.83, calcium 9.6, anion gap 6, alkaline phosphatase 82, albumin 4.2, AST 17, ALT 16, total protein 7.0, bilirubin 1.0, troponin 2> 3, WBC 6.6, hemoglobin 15.2, hematocrit 45.2, MCV 90.9, platelet 236, COVID-negative, influenza negative, chest x-ray questionable 1 cm lung nodule.  X-ray lumbar spine: Degenerative changes, no acute fracture or dislocation noted.  CT chest abdomen and pelvis: multiple mildly distended, fluid-filled loops of distal small bowel in the central abdomen measuring up to 3.9 cm in caliber, some loops fecalized.  Finding consistent with small bowel obstruction  Review of Systems: Review of Systems  Constitutional: Negative.   HENT: Negative.    Eyes: Negative.   Respiratory: Negative.     Cardiovascular: Negative.   Gastrointestinal:  Positive for abdominal pain, nausea and vomiting.  Genitourinary: Negative.   Musculoskeletal: Negative.   Skin: Negative.   Neurological: Negative.   Endo/Heme/Allergies: Negative.   Psychiatric/Behavioral:  Positive for depression.     Past Medical History:  Diagnosis Date   Depression    Stroke [facility])    Syphilis    TIA (transient ischemic attack)    Vertigo     Past Surgical History:  Procedure Laterality Date   COLONOSCOPY WITH PROPOFOL N/A 04/28/2015   Procedure: COLONOSCOPY WITH PROPOFOL;  Surgeon: Christene Lye, MD;  Location: [facility] ENDOSCOPY;  Service: Endoscopy;  Laterality: N/A;   COLONOSCOPY WITH PROPOFOL N/A 09/09/2019   Procedure: COLONOSCOPY WITH PROPOFOL;  Surgeon: Lesly Rubenstein, MD;  Location: [facility] ENDOSCOPY;  Service: Endoscopy;  Laterality: N/A;   ESOPHAGOGASTRODUODENOSCOPY (EGD) WITH PROPOFOL N/A 09/09/2019   Procedure: ESOPHAGOGASTRODUODENOSCOPY (EGD) WITH PROPOFOL;  Surgeon: Lesly Rubenstein, MD;  Location: [facility] ENDOSCOPY;  Service: Endoscopy;  Laterality: N/A;   KNEE ARTHROSCOPY     left knee     reports that he has quit smoking. His smoking use included cigarettes. He has never used smokeless tobacco. He reports that he does not currently use alcohol. He reports current drug use. Drugs: Marijuana, Amphetamines, and IV.  Allergies  Allergen Reactions   Mirtazapine Other (See Comments)    Patient reports serotonin toxicity     Family History  Problem Relation Age of Onset   Heart attack Mother    Heart attack Father    Family history reviewed and not pertinent.  Prior to Admission medications  Medication Sig Start Date End Date Taking? Authorizing Provider  buPROPion (WELLBUTRIN SR) 100 MG 12 hr tablet Take 100 mg by mouth every morning. 03/06/20  Yes [provider]  DESCOVY 200-25 MG tablet Take 1 tablet by mouth daily. 07/22/20  Yes [provider]  hydrOXYzine  (ATARAX/VISTARIL) 25 MG tablet Take 1 tablet (25 mg total) by mouth every 6 (six) hours as needed for itching. 02/22/20  Yes Johnn Hai, PA-C  tamsulosin [facility]) 0.4 MG CAPS capsule Take 1 capsule by mouth once daily 10/27/19  Yes Karamalegos, Devonne Doughty, DO  vitamin B-12 (CYANOCOBALAMIN) 1000 MCG tablet Take 1,000 mcg by mouth daily.   Yes [provider]  albuterol (VENTOLIN HFA) 108 (90 Base) MCG/ACT inhaler Inhale 2 puffs into the lungs every 4 (four) hours as needed for wheezing or shortness of breath (cough). Patient not taking: Reported on 02/20/2020 06/13/19   Olin Hauser, DO  diclofenac Sodium (VOLTAREN) 1 % GEL Apply 2 g topically 4 (four) times daily as needed (arthritis knee pain). Patient not taking: No sig reported 10/17/19   Olin Hauser, DO  diltiazem 2 % GEL Apply 1 application topically 3 (three) times daily. For 1 week as needed. Patient not taking: No sig reported 02/20/20   Olin Hauser, DO  fexofenadine (ALLEGRA) 180 MG tablet Take 180 mg by mouth daily.  Patient not taking: Reported on 04/27/2020    [provider]  fluticasone (FLONASE) 50 MCG/ACT nasal spray Place into both nostrils. Patient not taking: No sig reported 03/18/20   [provider]  hydrocortisone (ANUSOL-HC) 2.5 % rectal cream Apply topically 2 (two) times daily. Patient not taking: Reported on 04/27/2020 03/03/20   [provider]  meclizine (ANTIVERT) 25 MG tablet Take 1 tablet (25 mg total) by mouth 2 (two) times daily as needed for dizziness. Patient not taking: Reported on 04/27/2020 03/02/20   Merlyn Lot, MD  mirtazapine (REMERON) 30 MG tablet Take 1 tablet (30 mg total) by mouth at bedtime. Patient not taking: Reported on 04/27/2020 02/13/20   Olin Hauser, DO  mupirocin cream (BACTROBAN) 2 % Apply 1 application topically 2 (two) times daily as needed. Toipcal antibiotic. For skin sores. For 1 week as needed Patient  not taking: No sig reported 02/20/20   Olin Hauser, DO  predniSONE (STERAPRED UNI-PAK 48 TAB) 10 MG (48) TBPK tablet Take by mouth. Patient not taking: No sig reported 03/19/20   [provider]  promethazine (PHENERGAN) 12.5 MG tablet Take 1 tablet (12.5 mg total) by mouth every 6 (six) hours as needed. Patient not taking: No sig reported 03/02/20   Merlyn Lot, MD  sildenafil (REVATIO) 20 MG tablet Take 1-5 pills about 30 min prior to sex. Start with 1 and increase as needed. Patient not taking: Reported on 04/27/2020 11/27/18   Olin Hauser, DO  triamcinolone cream (KENALOG) 0.5 % Apply 1 application topically 2 (two) times daily. Topical steroid. To affected areas, for up to 2 weeks for bites Patient not taking: Reported on 04/27/2020 02/20/20   Olin Hauser, DO  zolpidem (AMBIEN CR) 6.25 MG CR tablet Take 1 tablet (6.25 mg total) by mouth at bedtime as needed for sleep. Patient not taking: Reported on 08/07/2020 04/07/20   Olin Hauser, DO    Physical Exam: Vitals:   01/20/21 1153 01/20/21 1306 01/20/21 1430 01/20/21 1445  BP: (!) 134/94 131/82    Pulse: 77 77 82 80  Resp: 17 16  Temp: 98.1 F (36.7 C)     TempSrc: Oral     SpO2: 97% 100% 96% 95%    Constitutional: Appears comfortable, not in any acute distress. Vitals:   01/20/21 1153 01/20/21 1306 01/20/21 1430 01/20/21 1445  BP: (!) 134/94 131/82    Pulse: 77 77 82 80  Resp: 17 16    Temp: 98.1 F (36.7 C)     TempSrc: Oral     SpO2: 97% 100% 96% 95%   Eyes: PERRL, lids and conjunctivae normal ENMT: Mucous membranes are moist.  Posterior pharynx without exudate..Normal dentition.  Neck: normal, supple, no masses, no thyromegaly Respiratory: Clear to auscultation bilaterally, no wheezing, no crackles, no accessory muscle use. Cardiovascular: S1-S2 heard, regular rate and rhythm, no murmur. Abdomen: Abdomen is soft, tenderness++, no guarding, no rigidity,  BS+ Musculoskeletal: No edema, no cyanosis, no clubbing, good ROM, no contractures. Normal muscle tone.  Skin: no rashes, lesions, ulcers. No induration Neurologic: CN 2-12 grossly intact. Sensation intact, DTR normal. Strength 5/5 in all 4.  Psychiatric: Normal judgment and insight. Alert and oriented x 3. Normal mood.    Labs on Admission: I have personally reviewed following labs and imaging studies  CBC: Recent Labs  Lab 01/20/21 1154  WBC 6.6  HGB 15.2  HCT 45.2  MCV 90.9  PLT 191   Basic Metabolic Panel: Recent Labs  Lab 01/20/21 1154  NA 140  K 4.1  CL 107  CO2 27  GLUCOSE 101*  BUN 14  CREATININE 0.83  CALCIUM 9.0   GFR: CrCl cannot be calculated (Unknown ideal weight.). Liver Function Tests: Recent Labs  Lab 01/20/21 1413  AST 17  ALT 16  ALKPHOS 82  BILITOT 1.0  PROT 7.0  ALBUMIN 4.2   No results for input(s): LIPASE, AMYLASE in the last 168 hours. No results for input(s): AMMONIA in the last 168 hours. Coagulation Profile: No results for input(s): INR, PROTIME in the last 168 hours. Cardiac Enzymes: No results for input(s): CKTOTAL, CKMB, CKMBINDEX, TROPONINI in the last 168 hours. BNP (last 3 results) No results for input(s): PROBNP in the last 8760 hours. HbA1C: No results for input(s): HGBA1C in the last 72 hours. CBG: No results for input(s): GLUCAP in the last 168 hours. Lipid Profile: No results for input(s): CHOL, HDL, LDLCALC, TRIG, CHOLHDL, LDLDIRECT in the last 72 hours. Thyroid Function Tests: No results for input(s): TSH, T4TOTAL, FREET4, T3FREE, THYROIDAB in the last 72 hours. Anemia Panel: No results for input(s): VITAMINB12, FOLATE, FERRITIN, TIBC, IRON, RETICCTPCT in the last 72 hours. Urine analysis:    Component Value Date/Time   COLORURINE YELLOW (A) 12/13/2019 1256   APPEARANCEUR CLEAR (A) 12/13/2019 1256   LABSPEC 1.031 (H) 12/13/2019 1256   PHURINE 5.0 12/13/2019 1256   GLUCOSEU NEGATIVE 12/13/2019 1256   HGBUR  NEGATIVE 12/13/2019 1256   BILIRUBINUR NEGATIVE 12/13/2019 1256   KETONESUR NEGATIVE 12/13/2019 1256   PROTEINUR NEGATIVE 12/13/2019 1256   NITRITE NEGATIVE 12/13/2019 1256   LEUKOCYTESUR NEGATIVE 12/13/2019 1256    Radiological Exams on Admission: DG Chest 2 View  Result Date: 01/20/2021 CLINICAL DATA:  Chest, back and upper abdominal pain over the last 2 days. Some shortness of breath. EXAM: CHEST - 2 VIEW COMPARISON:  03/02/2020 FINDINGS: Heart size is normal. Mediastinal shadows are normal. The pulmonary vascularity is normal. No pleural effusions. Old healed rib fractures in the right upper chest. Question 1 cm focal density in the right upper lobe could be a mass or nodule.  Chest CT recommended to further evaluate. IMPRESSION: Question 1 cm mass or nodule in the lateral right upper lobe. CT recommended for further evaluation. Otherwise no active disease. Electronically Signed   By: Nelson Chimes M.D.   On: 01/20/2021 12:47   DG Lumbar Spine 2-3 Views  Result Date: 01/20/2021 CLINICAL DATA:  low back pain x many weeks EXAM: LUMBAR SPINE - 2-3 VIEW COMPARISON:  None. FINDINGS: Very mild height loss of T12 and L1 is favored physiologic. Otherwise, no vertebral body height loss to suggest acute fracture. Mild (grade 1) anterolisthesis of L4 on L5, likely degenerative given facet arthropathy at this level. There also is facet arthropathy at L5-S1. Moderate to severe degenerative height loss at L5-S1 with endplate sclerosis. Calcific atherosclerosis of the aorta. IMPRESSION: 1. No radiographic evidence of acute fracture or traumatic malalignment. Cross-sectional imaging could provide more sensitive evaluation if clinically indicated. 2. Moderate to severe degenerative disc disease at L5-S1 and lower lumbar facet arthropathy and facet mediated mild (grade 1) anterolisthesis of L4 on L5. Electronically Signed   By: Margaretha Sheffield M.D.   On: 01/20/2021 14:27   CT CHEST ABDOMEN PELVIS W  CONTRAST  Result Date: 01/20/2021 CLINICAL DATA:  Chest pain and abdominal pain radiating to back for several days, nausea and diarrhea EXAM: CT CHEST, ABDOMEN, AND PELVIS WITH CONTRAST TECHNIQUE: Multidetector CT imaging of the chest, abdomen and pelvis was performed following the standard protocol during bolus administration of intravenous contrast. CONTRAST:  141mL OMNIPAQUE IOHEXOL 300 MG/ML  SOLN COMPARISON:  None. FINDINGS: CT CHEST FINDINGS Cardiovascular: Aortic atherosclerosis. Normal heart size. Scattered left and right coronary artery calcifications. No pericardial effusion. Mediastinum/Nodes: No enlarged mediastinal, hilar, or axillary lymph nodes. Thyroid gland, trachea, and esophagus demonstrate no significant findings. Lungs/Pleura: Bandlike scarring of the bilateral lung bases. No pleural effusion or pneumothorax. Musculoskeletal: No chest wall mass or suspicious bone lesions identified. CT ABDOMEN PELVIS FINDINGS Hepatobiliary: No solid liver abnormality is seen. No gallstones, gallbladder wall thickening, or biliary dilatation. Pancreas: Unremarkable. No pancreatic ductal dilatation or surrounding inflammatory changes. Spleen: Normal in size without significant abnormality. Adrenals/Urinary Tract: Adrenal glands are unremarkable. Kidneys are normal, without renal calculi, solid lesion, or hydronephrosis. Bladder is unremarkable. Stomach/Bowel: Stomach is within normal limits. Appendix appears normal. There are multiple mildly distended, fluid-filled loops of distal small bowel in the central abdomen measuring up to 3.9 cm in caliber, some loops fecalized. There are tightly knuckled loops of distal ileum in the low midline abdomen (series 2, image 100, 96). Vascular/Lymphatic: Aortic atherosclerosis. No enlarged abdominal or pelvic lymph nodes. Reproductive: No mass or other abnormality. Other: No abdominal wall hernia or abnormality. Small volume ascites in the pelvis (series 2, image 110).  Musculoskeletal: No acute or significant osseous findings. IMPRESSION: 1. There are multiple mildly distended, fluid-filled loops of distal small bowel in the central abdomen measuring up to 3.9 cm in caliber, some loops fecalized. There are tightly knuckled loops of distal ileum in the low midline abdomen. Findings are consistent with developing or partial small bowel obstruction. There is gas and stool present in the colon to the rectum. 2. Small volume ascites in the pelvis, likely reactive. 3. Coronary artery disease. Aortic Atherosclerosis (ICD10-I70.0). Electronically Signed   By: Delanna Ahmadi M.D.   On: 01/20/2021 15:20    EKG: Independently reviewed.  Sinus rhythm with premature atrial complexes.  Assessment/Plan Principal Problem:   SBO (small bowel obstruction) (HCC) Active Problems:   RLS (restless legs syndrome)   ED (  erectile dysfunction)   Insomnia   BPH with obstruction/lower urinary tract symptoms   Partial small bowel obstruction: Patient presented with abdominal pain, nausea and vomiting. CT abdomen showed dilated loops of small bowel consistent with small bowel obstruction General surgery consulted, Dr. Peyton Najjar  Continue bowel rest , IV hydration Adequate pain control with pain medications. Continue IV Zofran as needed for nausea vomiting NG tube insertion for bowel decompression. Continue to follow clinical course  BPH: We will hold on Flomax until patient is NPO.  Major depression: Will resume Wellbutrin once SBO resolves.  DVT prophylaxis:  Lovenox Code Status: Full code. Family Communication: No family at bed side. Disposition Plan:   Status is: Inpatient  Remains inpatient appropriate because:  Admitted for partial small bowel obstruction requiring IV hydration and bowel rest. Consults called: General surgery Dr. Peyton Najjar Admission status: Inpatient   Shawna Clamp MD [facility]   If 7PM-7AM, please contact night-coverage   01/20/2021,  4:40 PM

## 2021-01-20 NOTE — ED Triage Notes (Addendum)
Pt comes with c/o CP for last few days. Pt states also some tingling in hands. Pt states some lower back pain and some nausea. Pt states he feels like he needs to throw up.  Pt states diarrhea.  Pt states SOB yesterday when he was out walking his dog.

## 2021-01-20 NOTE — ED Notes (Signed)
ED Provider at bedside. 

## 2021-01-20 NOTE — Progress Notes (Signed)
Report given to Vanessa, RN.

## 2021-01-20 NOTE — ED Notes (Signed)
Pt ng tube verified by xray. Pt ng placed to low wall suction

## 2021-01-20 NOTE — Progress Notes (Signed)
Pt arrived to room 148

## 2021-01-20 NOTE — ED Notes (Addendum)
See triage note. Pt to ED for intermittent chest pressure, L side. Denies radiation to arm, jaw, neck.  States that yesterday felt SOB on his usual 3 mile walk. Complains of neck tightness and lower back pain since 3 days. States at times feels tingling in R hand.  Also complains of upper abdominal pain across whole upper abdomen. States pain moves around and he thinks he "may need an xray or something". In NAD at this time.

## 2021-01-21 ENCOUNTER — Encounter: Payer: Self-pay | Admitting: Family Medicine

## 2021-01-21 ENCOUNTER — Inpatient Hospital Stay: Payer: Medicare Other

## 2021-01-21 DIAGNOSIS — M4316 Spondylolisthesis, lumbar region: Secondary | ICD-10-CM

## 2021-01-21 DIAGNOSIS — R9389 Abnormal findings on diagnostic imaging of other specified body structures: Secondary | ICD-10-CM

## 2021-01-21 DIAGNOSIS — F32A Depression, unspecified: Secondary | ICD-10-CM

## 2021-01-21 DIAGNOSIS — Z79899 Other long term (current) drug therapy: Secondary | ICD-10-CM

## 2021-01-21 LAB — COMPREHENSIVE METABOLIC PANEL
ALT: 14 U/L (ref 0–44)
AST: 14 U/L — ABNORMAL LOW (ref 15–41)
Albumin: 3.5 g/dL (ref 3.5–5.0)
Alkaline Phosphatase: 70 U/L (ref 38–126)
Anion gap: 8 (ref 5–15)
BUN: 13 mg/dL (ref 8–23)
CO2: 26 mmol/L (ref 22–32)
Calcium: 8.3 mg/dL — ABNORMAL LOW (ref 8.9–10.3)
Chloride: 108 mmol/L (ref 98–111)
Creatinine, Ser: 0.81 mg/dL (ref 0.61–1.24)
GFR, Estimated: >60 mL/min (ref >60)
Glucose, Bld: 79 mg/dL (ref 70–99)
Potassium: 3.7 mmol/L (ref 3.5–5.1)
Sodium: 142 mmol/L (ref 135–145)
Total Bilirubin: 1.1 mg/dL (ref 0.3–1.2)
Total Protein: 6 g/dL — ABNORMAL LOW (ref 6.5–8.1)

## 2021-01-21 LAB — CBC
HCT: 39.6 % (ref 39.0–52.0)
Hemoglobin: 13.1 g/dL (ref 13.0–17.0)
MCH: 30.4 pg (ref 26.0–34.0)
MCHC: 33.1 g/dL (ref 30.0–36.0)
MCV: 91.9 fL (ref 80.0–100.0)
Platelets: 214 10*3/uL (ref 150–400)
RBC: 4.31 MIL/uL (ref 4.22–5.81)
RDW: 12.4 % (ref 11.5–15.5)
WBC: 6.1 10*3/uL (ref 4.0–10.5)
nRBC: 0 % (ref 0.0–0.2)

## 2021-01-21 LAB — GLUCOSE, CAPILLARY
Glucose-Capillary: 63 mg/dL — ABNORMAL LOW (ref 70–99)
Glucose-Capillary: 105 mg/dL — ABNORMAL HIGH (ref 70–99)
Glucose-Capillary: 115 mg/dL — ABNORMAL HIGH (ref 70–99)

## 2021-01-21 LAB — MAGNESIUM: Magnesium: 2.1 mg/dL (ref 1.7–2.4)

## 2021-01-21 LAB — PHOSPHORUS: Phosphorus: 4 mg/dL (ref 2.5–4.6)

## 2021-01-21 NOTE — Assessment & Plan Note (Signed)
11/2020 HIV negative descovy on hold for now, resume when able to take PO

## 2021-01-21 NOTE — Assessment & Plan Note (Addendum)
Resume flomax He notes LUTS, consider urology follow up outpatient

## 2021-01-21 NOTE — Assessment & Plan Note (Addendum)
CT 12/14 with findings consistent with partial SBO or developing SBO, reactive ascites KUB 12/15 with persistent, but improving SBO Appreciate surgery recommendations - tolerating soft diet today, plan for discharge, consider GI follow up outpatient (discussed with surgery) No concern for infection at this time with normal wbc, afebrile (he notes hx GI infection, though I haven't found this on chart review clearly yet, will follow)

## 2021-01-21 NOTE — Assessment & Plan Note (Addendum)
No longer taking remeron or ambient

## 2021-01-21 NOTE — Assessment & Plan Note (Signed)
1 cm mass/nodule noted on cxr, recommended CT -> CT chest with bandlike scarring of bilateral lung bases

## 2021-01-21 NOTE — Assessment & Plan Note (Addendum)
wellbutrin ° °

## 2021-01-21 NOTE — Hospital Course (Addendum)
62 with hx depression, chronic back pain, hx syphilis, CVA, on PREP who presented with abdominal pain x2 days, found to have pSBO.  Surgery following.  Improved with surgical management.  Follow outpatient.  See below for additional details

## 2021-01-21 NOTE — Assessment & Plan Note (Addendum)
Mild, follow Moderate to severe ddd at L5-S1 and lower lumbar facet arthropathy and facet mediated mild anterolisthesis of L4 on L5

## 2021-01-21 NOTE — Progress Notes (Signed)
PROGRESS NOTE    Tony Franco  YIF:[id number] DOB: March 07, 1950 DOA: 01/20/2021 PCP: [facility]  Chief Complaint  Patient presents with   Chest Pain    Brief Narrative:  89 with hx depression, chronic back pain, hx syphilis, CVA, on PREP who presented with abdominal pain x2 days, found to have SBO.  Surgery following.    Assessment & Plan:   Principal Problem:   SBO (small bowel obstruction) (HCC) Active Problems:   RLS (restless legs syndrome)   ED (erectile dysfunction)   BPH with obstruction/lower urinary tract symptoms   Depression   Insomnia   Abnormal CXR   PREP   Anterolisthesis of lumbar spine   * SBO (small bowel obstruction) ([place]) CT 12/14 with findings consistent with partial SBO or developing SBO, reactive ascites KUB this am with persistent, but improving SBO Appreciate surgery recommendations No concern for infection at this time with normal wbc, afebrile (he notes hx GI infection, though I haven't found this on chart review clearly yet, will follow)  BPH with obstruction/lower urinary tract symptoms Holding flomax for now  Depression Holding home wellbutrin  Insomnia Holding ambien  Abnormal CXR 1 cm mass/nodule noted on cxr, recommended CT -> CT chest with bandlike scarring of bilateral lung bases  Anterolisthesis of lumbar spine Mild, follow  PREP 11/2020 HIV negative descovy on hold for now, resume when able to take PO    DVT prophylaxis: lovenox Code Status: full Family Communication: none at bedside Disposition:   Status is: Inpatient  Remains inpatient appropriate because: continued surgery eval       Consultants:  surgery  Procedures:  none  Antimicrobials:  Anti-infectives (From admission, onward)    None       Subjective: Feels Khyre Germond little better  Objective: Vitals:   01/21/21 0554 01/21/21 0734 01/21/21 1125 01/21/21 1537  BP:  123/71 127/70 128/72  Pulse:  76 75 72  Resp:  16 16 15   Temp:   97.7 F (36.5 C) 97.6 F (36.4 C) 98.1 F (36.7 C)  TempSrc:      SpO2:  97% 95% 99%  Weight:      Height: 6' (1.829 m)       Intake/Output Summary (Last 24 hours) at 01/21/2021 1804 Last data filed at 01/21/2021 1409 Gross per 24 hour  Intake 597.87 ml  Output 300 ml  Net 297.87 ml   Filed Weights   01/21/21 0552  Weight: 76.9 kg    Examination:  General exam: Appears calm and comfortable  Respiratory system: unlabored Cardiovascular system: RRR Gastrointestinal system: Abdomen is nondistended, soft and nontender. Central nervous system: Alert and oriented. No focal neurological deficits. Extremities: no LEE Skin: No rashes, lesions or ulcers Psychiatry: Judgement and insight appear normal. Mood & affect appropriate.     Data Reviewed: I have personally reviewed following labs and imaging studies  CBC: Recent Labs  Lab 01/20/21 1154 01/21/21 0353  WBC 6.6 6.1  HGB 15.2 13.1  HCT 45.2 39.6  MCV 90.9 91.9  PLT 236 867    Basic Metabolic Panel: Recent Labs  Lab 01/20/21 1154 01/21/21 0353  NA 140 142  K 4.1 3.7  CL 107 108  CO2 27 26  GLUCOSE 101* 79  BUN 14 13  CREATININE 0.83 0.81  CALCIUM 9.0 8.3*  MG  --  2.1  PHOS  --  4.0    GFR: Estimated Creatinine Clearance: 96.3 mL/min (by C-G formula based on SCr of 0.81 mg/dL).  Liver Function Tests: Recent Labs  Lab 01/20/21 1413 01/21/21 0353  AST 17 14*  ALT 16 14  ALKPHOS 82 70  BILITOT 1.0 1.1  PROT 7.0 6.0*  ALBUMIN 4.2 3.5    CBG: Recent Labs  Lab 01/21/21 0730 01/21/21 1141 01/21/21 1538  GLUCAP 63* 105* 115*     Recent Results (from the past 240 hour(s))  Resp Panel by RT-PCR (Flu Ha Shannahan&B, Covid) Nasopharyngeal Swab     Status: None   Collection Time: 01/20/21 11:54 AM   Specimen: Nasopharyngeal Swab; Nasopharyngeal(NP) swabs in vial transport medium  Result Value Ref Range Status   SARS Coronavirus 2 by RT PCR NEGATIVE NEGATIVE Final    Comment: (NOTE) SARS-CoV-2 target  nucleic acids are NOT DETECTED.  The SARS-CoV-2 RNA is generally detectable in upper respiratory specimens during the acute phase of infection. The lowest concentration of SARS-CoV-2 viral copies this assay can detect is 138 copies/mL. Jasmia Angst negative result does not preclude SARS-Cov-2 infection and should not be used as the sole basis for treatment or other patient management decisions. Tameah Mihalko negative result may occur with  improper specimen collection/handling, submission of specimen other than nasopharyngeal swab, presence of viral mutation(s) within the areas targeted by this assay, and inadequate number of viral copies(<138 copies/mL). Lewie Deman negative result must be combined with clinical observations, patient history, and epidemiological information. The expected result is Negative.  Fact Sheet for Patients:  EntrepreneurPulse.com.au  Fact Sheet for Healthcare Providers:  IncredibleEmployment.be  This test is no t yet approved or cleared by the [place] FDA and  has been authorized for detection and/or diagnosis of SARS-CoV-2 by FDA under an Emergency Use Authorization (EUA). This EUA will remain  in effect (meaning this test can be used) for the duration of the COVID-19 declaration under Section 564(b)(1) of the Act, 21 U.S.C.section 360bbb-3(b)(1), unless the authorization is terminated  or revoked sooner.       Influenza Constancia Geeting by PCR NEGATIVE NEGATIVE Final   Influenza B by PCR NEGATIVE NEGATIVE Final    Comment: (NOTE) The Xpert Xpress SARS-CoV-2/FLU/RSV plus assay is intended as an aid in the diagnosis of influenza from Nasopharyngeal swab specimens and should not be used as Sanda Dejoy sole basis for treatment. Nasal washings and aspirates are unacceptable for Xpert Xpress SARS-CoV-2/FLU/RSV testing.  Fact Sheet for Patients: EntrepreneurPulse.com.au  Fact Sheet for Healthcare  Providers: IncredibleEmployment.be  This test is not yet approved or cleared by the [place] FDA and has been authorized for detection and/or diagnosis of SARS-CoV-2 by FDA under an Emergency Use Authorization (EUA). This EUA will remain in effect (meaning this test can be used) for the duration of the COVID-19 declaration under Section 564(b)(1) of the Act, 21 U.S.C. section 360bbb-3(b)(1), unless the authorization is terminated or revoked.  Performed at [facility], [address], [place], [place] [address]          Radiology Studies: DG Chest 2 View  Result Date: 01/20/2021 CLINICAL DATA:  Chest, back and upper abdominal pain over the last 2 days. Some shortness of breath. EXAM: CHEST - 2 VIEW COMPARISON:  03/02/2020 FINDINGS: Heart size is normal. Mediastinal shadows are normal. The pulmonary vascularity is normal. No pleural effusions. Old healed rib fractures in the right upper chest. Question 1 cm focal density in the right upper lobe could be Bryce Cheever mass or nodule. Chest CT recommended to further evaluate. IMPRESSION: Question 1 cm mass or nodule in the lateral right upper lobe. CT recommended for further evaluation.  Otherwise no active disease. Electronically Signed   By: Nelson Chimes M.D.   On: 01/20/2021 12:47   DG Lumbar Spine 2-3 Views  Result Date: 01/20/2021 CLINICAL DATA:  low back pain x many weeks EXAM: LUMBAR SPINE - 2-3 VIEW COMPARISON:  None. FINDINGS: Very mild height loss of T12 and L1 is favored physiologic. Otherwise, no vertebral body height loss to suggest acute fracture. Mild (grade 1) anterolisthesis of L4 on L5, likely degenerative given facet arthropathy at this level. There also is facet arthropathy at L5-S1. Moderate to severe degenerative height loss at L5-S1 with endplate sclerosis. Calcific atherosclerosis of the aorta. IMPRESSION: 1. No radiographic evidence of acute fracture or traumatic malalignment. Cross-sectional  imaging could provide more sensitive evaluation if clinically indicated. 2. Moderate to severe degenerative disc disease at L5-S1 and lower lumbar facet arthropathy and facet mediated mild (grade 1) anterolisthesis of L4 on L5. Electronically Signed   By: Margaretha Sheffield M.D.   On: 01/20/2021 14:27   CT CHEST ABDOMEN PELVIS W CONTRAST  Result Date: 01/20/2021 CLINICAL DATA:  Chest pain and abdominal pain radiating to back for several days, nausea and diarrhea EXAM: CT CHEST, ABDOMEN, AND PELVIS WITH CONTRAST TECHNIQUE: Multidetector CT imaging of the chest, abdomen and pelvis was performed following the standard protocol during bolus administration of intravenous contrast. CONTRAST:  141mL OMNIPAQUE IOHEXOL 300 MG/ML  SOLN COMPARISON:  None. FINDINGS: CT CHEST FINDINGS Cardiovascular: Aortic atherosclerosis. Normal heart size. Scattered left and right coronary artery calcifications. No pericardial effusion. Mediastinum/Nodes: No enlarged mediastinal, hilar, or axillary lymph nodes. Thyroid gland, trachea, and esophagus demonstrate no significant findings. Lungs/Pleura: Bandlike scarring of the bilateral lung bases. No pleural effusion or pneumothorax. Musculoskeletal: No chest wall mass or suspicious bone lesions identified. CT ABDOMEN PELVIS FINDINGS Hepatobiliary: No solid liver abnormality is seen. No gallstones, gallbladder wall thickening, or biliary dilatation. Pancreas: Unremarkable. No pancreatic ductal dilatation or surrounding inflammatory changes. Spleen: Normal in size without significant abnormality. Adrenals/Urinary Tract: Adrenal glands are unremarkable. Kidneys are normal, without renal calculi, solid lesion, or hydronephrosis. Bladder is unremarkable. Stomach/Bowel: Stomach is within normal limits. Appendix appears normal. There are multiple mildly distended, fluid-filled loops of distal small bowel in the central abdomen measuring up to 3.9 cm in caliber, some loops fecalized. There are  tightly knuckled loops of distal ileum in the low midline abdomen (series 2, image 100, 96). Vascular/Lymphatic: Aortic atherosclerosis. No enlarged abdominal or pelvic lymph nodes. Reproductive: No mass or other abnormality. Other: No abdominal wall hernia or abnormality. Small volume ascites in the pelvis (series 2, image 110). Musculoskeletal: No acute or significant osseous findings. IMPRESSION: 1. There are multiple mildly distended, fluid-filled loops of distal small bowel in the central abdomen measuring up to 3.9 cm in caliber, some loops fecalized. There are tightly knuckled loops of distal ileum in the low midline abdomen. Findings are consistent with developing or partial small bowel obstruction. There is gas and stool present in the colon to the rectum. 2. Small volume ascites in the pelvis, likely reactive. 3. Coronary artery disease. Aortic Atherosclerosis (ICD10-I70.0). Electronically Signed   By: Delanna Ahmadi M.D.   On: 01/20/2021 15:20   DG Abd Portable 1V-Small Bowel Obstruction Protocol-initial, 8 hr delay  Result Date: 01/21/2021 CLINICAL DATA:  70 year old male with history of small-bowel obstruction. 8 hour delay film. EXAM: PORTABLE ABDOMEN - 1 VIEW COMPARISON:  Abdominal radiograph 01/20/2021. FINDINGS: Previously noted dilated loops of bowel have largely resolved, with minimal small-bowel dilatation remaining in the left  upper quadrant of the abdomen, where the bowel is focally dilated up to 4.3 cm in diameter. Oral contrast material is now noted throughout the colon. Appendix is also visualized, filled with contrast. Large amount of iodinated contrast material is also noted in the urinary bladder. IMPRESSION: 1. Persistent but improving partial small bowel obstruction, as above. Electronically Signed   By: Vinnie Langton M.D.   On: 01/21/2021 05:10   DG Abd Portable 1V-Small Bowel Protocol-Position Verification  Result Date: 01/20/2021 CLINICAL DATA:  NG tube verification EXAM:  PORTABLE ABDOMEN - 1 VIEW COMPARISON:  08/15/2019, CT 01/20/2021 FINDINGS: Esophageal tube tip and side port overlie the proximal stomach. There are multiple dilated loops of small bowel measuring up to 4.5 cm consistent with Ranisha Allaire bowel obstruction. IMPRESSION: Esophageal tube tip and side port overlie the proximal stomach. Small bowel distension suspicious for obstruction Electronically Signed   By: Donavan Foil M.D.   On: 01/20/2021 17:29        Scheduled Meds:  enoxaparin (LOVENOX) injection  40 mg Subcutaneous Q24H   Continuous Infusions:  sodium chloride 125 mL/hr at 01/21/21 1152     LOS: 1 day    Time spent: over 30 min    Fayrene Helper, MD [facility]   To contact the attending provider between 7A-7P or the covering provider during after hours 7P-7A, please log into the web site [email or web address] and access using universal  password for that web site. If you do not have the password, please call the hospital operator.  01/21/2021, 6:04 PM

## 2021-01-21 NOTE — TOC Progression Note (Signed)
Transition of Care [facility]) - Progression Note    Patient Details  Name: Tony Franco MRN: [id number] Date of Birth: 1950/06/21  Transition of Care [facility]) CM/SW [place], RN Phone Number: 01/21/2021, 9:27 AM  Clinical Narrative:    Transition of Care [facility]) Screening Note   Patient Details  Name: [name] Date of Birth: 08-17-1950   Transition of Care [facility]) CM/SW Contact:    Conception Oms, RN Phone Number: 01/21/2021, 9:27 AM    Transition of Care Department [facility]) has reviewed patient and no TOC needs have been identified at this time. We will continue to monitor patient advancement through interdisciplinary progression rounds. If new patient transition needs arise, please place a TOC consult.           Expected Discharge Plan and Services                                                 Social Determinants of Health (SDOH) Interventions    Readmission Risk Interventions No flowsheet data found.

## 2021-01-21 NOTE — Progress Notes (Signed)
Patient arrived to room 148 in stable condition. Aox4. Reports 8/10 pain. Denies nausea. Plan of care reviewed with patient. NGT clamped for gastrografin prep. Call bell usage explained. Bed alarm on.

## 2021-01-22 LAB — COMPREHENSIVE METABOLIC PANEL
ALT: 13 U/L (ref 0–44)
AST: 14 U/L — ABNORMAL LOW (ref 15–41)
Albumin: 3.3 g/dL — ABNORMAL LOW (ref 3.5–5.0)
Alkaline Phosphatase: 71 U/L (ref 38–126)
Anion gap: 4 — ABNORMAL LOW (ref 5–15)
BUN: 9 mg/dL (ref 8–23)
CO2: 25 mmol/L (ref 22–32)
Calcium: 8.2 mg/dL — ABNORMAL LOW (ref 8.9–10.3)
Chloride: 108 mmol/L (ref 98–111)
Creatinine, Ser: 0.68 mg/dL (ref 0.61–1.24)
GFR, Estimated: >60 mL/min (ref >60)
Glucose, Bld: 90 mg/dL (ref 70–99)
Potassium: 3.7 mmol/L (ref 3.5–5.1)
Sodium: 137 mmol/L (ref 135–145)
Total Bilirubin: 0.9 mg/dL (ref 0.3–1.2)
Total Protein: 5.9 g/dL — ABNORMAL LOW (ref 6.5–8.1)

## 2021-01-22 LAB — CBC WITH DIFFERENTIAL/PLATELET
Abs Immature Granulocytes: 0.02 10*3/uL (ref 0.00–0.07)
Basophils Absolute: 0 10*3/uL (ref 0.0–0.1)
Basophils Relative: 0 %
Eosinophils Absolute: 0.1 10*3/uL (ref 0.0–0.5)
Eosinophils Relative: 2 %
HCT: 38.4 % — ABNORMAL LOW (ref 39.0–52.0)
Hemoglobin: 12.8 g/dL — ABNORMAL LOW (ref 13.0–17.0)
Immature Granulocytes: 0 %
Lymphocytes Relative: 26 %
Lymphs Abs: 1.6 10*3/uL (ref 0.7–4.0)
MCH: 30.1 pg (ref 26.0–34.0)
MCHC: 33.3 g/dL (ref 30.0–36.0)
MCV: 90.4 fL (ref 80.0–100.0)
Monocytes Absolute: 0.6 10*3/uL (ref 0.1–1.0)
Monocytes Relative: 11 %
Neutro Abs: 3.7 10*3/uL (ref 1.7–7.7)
Neutrophils Relative %: 61 %
Platelets: 187 10*3/uL (ref 150–400)
RBC: 4.25 MIL/uL (ref 4.22–5.81)
RDW: 12.1 % (ref 11.5–15.5)
WBC: 6.1 10*3/uL (ref 4.0–10.5)
nRBC: 0 % (ref 0.0–0.2)

## 2021-01-22 LAB — MAGNESIUM: Magnesium: 1.9 mg/dL (ref 1.7–2.4)

## 2021-01-22 LAB — PHOSPHORUS: Phosphorus: 3 mg/dL (ref 2.5–4.6)

## 2021-01-22 NOTE — Plan of Care (Signed)
°  Problem: Elimination: Goal: Will not experience complications related to bowel motility Outcome: Progressing   Problem: Elimination: Goal: Will not experience complications related to bowel motility Outcome: Progressing   Patient had multiple bowel movements. Tolerated full liquid diet with no difficulty. Reported abdominal cramping at start of shift, requested prn pain med with relief afterward. No further pain intervention needed through the night. Patient this morning denies abdominal cramp/pain. Continues on IV fluids per order. PO fluids encouraged.

## 2021-01-22 NOTE — Discharge Summary (Signed)
Physician Discharge Summary  BLONG BUSK OPF:[id number] DOB: [birth year] DOA: 01/20/2021  PCP: [facility] date: 01/20/2021 Discharge date: 01/22/2021  Time spent: 40 minutes  Recommendations for Outpatient Follow-up:  Follow outpatient CBC/CMP Consider GI follow outpatient LUTS, on flomax, consider adding proscar and/or urology follow up Degenerative disc disease on imaging, follow with PCP outpatient    Discharge Diagnoses:  Principal Problem:   SBO (small bowel obstruction) ([place]) Active Problems:   RLS (restless legs syndrome)   ED (erectile dysfunction)   BPH with obstruction/lower urinary tract symptoms   Depression   Insomnia   Abnormal CXR   PREP   Anterolisthesis of lumbar spine   Discharge Condition: stable  Diet recommendation: heart healthy  Filed Weights   01/21/21 0552  Weight: 76.9 kg    History of present illness:  70 with hx depression, chronic back pain, hx syphilis, CVA, on PREP who presented with abdominal pain x2 days, found to have pSBO.  Surgery following.  Improved with surgical management.  Follow outpatient.  See below for additional details  Hospital Course:  * SBO (small bowel obstruction) ([place]) CT 12/14 with findings consistent with partial SBO or developing SBO, reactive ascites KUB 12/15 with persistent, but improving SBO Appreciate surgery recommendations - tolerating soft diet today, plan for discharge, consider GI follow up outpatient (discussed with surgery) No concern for infection at this time with normal wbc, afebrile (he notes hx GI infection, though I haven't found this on chart review clearly yet, will follow)  BPH with obstruction/lower urinary tract symptoms Resume flomax He notes LUTS, consider urology follow up outpatient  Depression wellbutrin  Insomnia No longer taking remeron or ambient  Abnormal CXR 1 cm mass/nodule noted on cxr, recommended CT -> CT chest with bandlike scarring of bilateral  lung bases  Anterolisthesis of lumbar spine Mild, follow Moderate to severe ddd at L5-S1 and lower lumbar facet arthropathy and facet mediated mild anterolisthesis of L4 on L5  PREP 11/2020 HIV negative descovy on hold for now, resume when able to take PO    Procedures: none   Consultations: surgery  Discharge Exam: Vitals:   01/22/21 0804 01/22/21 1203  BP: 118/74 123/79  Pulse: 70 70  Resp: 16 15  Temp: (!) 97.4 F (36.3 C) 98.5 F (36.9 C)  SpO2: 96% 96%   Eager to discharge Feels he's improving  General: No acute distress. Cardiovascular: RRR Lungs: unlabored Abdomen: Soft, nontender, mildy distended  Neurological: Alert and oriented 3. Moves all extremities 4 . Cranial nerves II through XII grossly intact. Skin: Warm and dry. No rashes or lesions. Extremities: No clubbing or cyanosis. No edema.   Discharge Instructions   Discharge Instructions     Call MD for:  difficulty breathing, headache or visual disturbances   Complete by: As directed    Call MD for:  extreme fatigue   Complete by: As directed    Call MD for:  hives   Complete by: As directed    Call MD for:  persistant dizziness or light-headedness   Complete by: As directed    Call MD for:  persistant nausea and vomiting   Complete by: As directed    Call MD for:  redness, tenderness, or signs of infection (pain, swelling, redness, odor or green/yellow discharge around incision site)   Complete by: As directed    Call MD for:  severe uncontrolled pain   Complete by: As directed    Call MD for:  temperature >  100.4   Complete by: As directed    Diet - low sodium heart healthy   Complete by: As directed    Discharge instructions   Complete by: As directed    You were seen for Cheston Coury partial small bowel obstruction.  You've improved with conservative management.  Continue Shanyn Preisler soft diet and advance this gradually as tolerated.   Consider discussing GI follow up outpatient with your PCP.  Your  back pain has resolved, given this resolution with resolution of your gastrointestinal symptoms, I think the GI issues may have been the cause of your back discomfort.  Your x rays showed moderate to severe degenerative disc disease at L5-S1 and lower lumbar facet arthropathy and facet mediated mild (grade 1) anterolisthesis of L4 on L5.  You can follow these findings up with your PCP as an outpatient.  Return for new, recurrent, or worsening symptoms.  Please ask your PCP to request records from this hospitalization so they know what was done and what the next steps will be.   Increase activity slowly   Complete by: As directed       Allergies as of 01/22/2021       Reactions   Mirtazapine Other (See Comments)   Patient reports serotonin toxicity         Medication List     STOP taking these medications    albuterol 108 (90 Base) MCG/ACT inhaler Commonly known as: VENTOLIN HFA   diclofenac Sodium 1 % Gel Commonly known as: Voltaren   diltiazem 2 % Gel   fexofenadine 180 MG tablet Commonly known as: ALLEGRA   fluticasone 50 MCG/ACT nasal spray Commonly known as: FLONASE   hydrocortisone 2.5 % rectal cream Commonly known as: ANUSOL-HC   meclizine 25 MG tablet Commonly known as: ANTIVERT   mirtazapine 30 MG tablet Commonly known as: REMERON   mupirocin cream 2 % Commonly known as: Bactroban   predniSONE 10 MG (48) Tbpk tablet Commonly known as: STERAPRED UNI-PAK 48 TAB   promethazine 12.5 MG tablet Commonly known as: PHENERGAN   sildenafil 20 MG tablet Commonly known as: REVATIO   triamcinolone cream 0.5 % Commonly known as: KENALOG   zolpidem 6.25 MG CR tablet Commonly known as: AMBIEN CR       TAKE these medications    buPROPion ER 100 MG 12 hr tablet Commonly known as: WELLBUTRIN SR Take 100 mg by mouth every morning.   Descovy 200-25 MG tablet Generic drug: emtricitabine-tenofovir AF Take 1 tablet by mouth daily.   hydrOXYzine 25 MG  tablet Commonly known as: ATARAX Take 1 tablet (25 mg total) by mouth every 6 (six) hours as needed for itching.   tamsulosin 0.4 MG Caps capsule Commonly known as: FLOMAX Take 1 capsule by mouth once daily   vitamin B-12 1000 MCG tablet Commonly known as: CYANOCOBALAMIN Take 1,000 mcg by mouth daily.       Allergies  Allergen Reactions   Mirtazapine Other (See Comments)    Patient reports serotonin toxicity       The results of significant diagnostics from this hospitalization (including imaging, microbiology, ancillary and laboratory) are listed below for reference.    Significant Diagnostic Studies: DG Chest 2 View  Result Date: 01/20/2021 CLINICAL DATA:  Chest, back and upper abdominal pain over the last 2 days. Some shortness of breath. EXAM: CHEST - 2 VIEW COMPARISON:  03/02/2020 FINDINGS: Heart size is normal. Mediastinal shadows are normal. The pulmonary vascularity is normal. No pleural effusions. Old  healed rib fractures in the right upper chest. Question 1 cm focal density in the right upper lobe could be Marlon Suleiman mass or nodule. Chest CT recommended to further evaluate. IMPRESSION: Question 1 cm mass or nodule in the lateral right upper lobe. CT recommended for further evaluation. Otherwise no active disease. Electronically Signed   By: Nelson Chimes M.D.   On: 01/20/2021 12:47   DG Lumbar Spine 2-3 Views  Result Date: 01/20/2021 CLINICAL DATA:  low back pain x many weeks EXAM: LUMBAR SPINE - 2-3 VIEW COMPARISON:  None. FINDINGS: Very mild height loss of T12 and L1 is favored physiologic. Otherwise, no vertebral body height loss to suggest acute fracture. Mild (grade 1) anterolisthesis of L4 on L5, likely degenerative given facet arthropathy at this level. There also is facet arthropathy at L5-S1. Moderate to severe degenerative height loss at L5-S1 with endplate sclerosis. Calcific atherosclerosis of the aorta. IMPRESSION: 1. No radiographic evidence of acute fracture or  traumatic malalignment. Cross-sectional imaging could provide more sensitive evaluation if clinically indicated. 2. Moderate to severe degenerative disc disease at L5-S1 and lower lumbar facet arthropathy and facet mediated mild (grade 1) anterolisthesis of L4 on L5. Electronically Signed   By: Margaretha Sheffield M.D.   On: 01/20/2021 14:27   CT CHEST ABDOMEN PELVIS W CONTRAST  Result Date: 01/20/2021 CLINICAL DATA:  Chest pain and abdominal pain radiating to back for several days, nausea and diarrhea EXAM: CT CHEST, ABDOMEN, AND PELVIS WITH CONTRAST TECHNIQUE: Multidetector CT imaging of the chest, abdomen and pelvis was performed following the standard protocol during bolus administration of intravenous contrast. CONTRAST:  15mL OMNIPAQUE IOHEXOL 300 MG/ML  SOLN COMPARISON:  None. FINDINGS: CT CHEST FINDINGS Cardiovascular: Aortic atherosclerosis. Normal heart size. Scattered left and right coronary artery calcifications. No pericardial effusion. Mediastinum/Nodes: No enlarged mediastinal, hilar, or axillary lymph nodes. Thyroid gland, trachea, and esophagus demonstrate no significant findings. Lungs/Pleura: Bandlike scarring of the bilateral lung bases. No pleural effusion or pneumothorax. Musculoskeletal: No chest wall mass or suspicious bone lesions identified. CT ABDOMEN PELVIS FINDINGS Hepatobiliary: No solid liver abnormality is seen. No gallstones, gallbladder wall thickening, or biliary dilatation. Pancreas: Unremarkable. No pancreatic ductal dilatation or surrounding inflammatory changes. Spleen: Normal in size without significant abnormality. Adrenals/Urinary Tract: Adrenal glands are unremarkable. Kidneys are normal, without renal calculi, solid lesion, or hydronephrosis. Bladder is unremarkable. Stomach/Bowel: Stomach is within normal limits. Appendix appears normal. There are multiple mildly distended, fluid-filled loops of distal small bowel in the central abdomen measuring up to 3.9 cm in  caliber, some loops fecalized. There are tightly knuckled loops of distal ileum in the low midline abdomen (series 2, image 100, 96). Vascular/Lymphatic: Aortic atherosclerosis. No enlarged abdominal or pelvic lymph nodes. Reproductive: No mass or other abnormality. Other: No abdominal wall hernia or abnormality. Small volume ascites in the pelvis (series 2, image 110). Musculoskeletal: No acute or significant osseous findings. IMPRESSION: 1. There are multiple mildly distended, fluid-filled loops of distal small bowel in the central abdomen measuring up to 3.9 cm in caliber, some loops fecalized. There are tightly knuckled loops of distal ileum in the low midline abdomen. Findings are consistent with developing or partial small bowel obstruction. There is gas and stool present in the colon to the rectum. 2. Small volume ascites in the pelvis, likely reactive. 3. Coronary artery disease. Aortic Atherosclerosis (ICD10-I70.0). Electronically Signed   By: Delanna Ahmadi M.D.   On: 01/20/2021 15:20   DG Abd Portable 1V-Small Bowel Obstruction Protocol-initial, 8 hr  delay  Result Date: 01/21/2021 CLINICAL DATA:  70 year old male with history of small-bowel obstruction. 8 hour delay film. EXAM: PORTABLE ABDOMEN - 1 VIEW COMPARISON:  Abdominal radiograph 01/20/2021. FINDINGS: Previously noted dilated loops of bowel have largely resolved, with minimal small-bowel dilatation remaining in the left upper quadrant of the abdomen, where the bowel is focally dilated up to 4.3 cm in diameter. Oral contrast material is now noted throughout the colon. Appendix is also visualized, filled with contrast. Large amount of iodinated contrast material is also noted in the urinary bladder. IMPRESSION: 1. Persistent but improving partial small bowel obstruction, as above. Electronically Signed   By: Vinnie Langton M.D.   On: 01/21/2021 05:10   DG Abd Portable 1V-Small Bowel Protocol-Position Verification  Result Date:  01/20/2021 CLINICAL DATA:  NG tube verification EXAM: PORTABLE ABDOMEN - 1 VIEW COMPARISON:  08/15/2019, CT 01/20/2021 FINDINGS: Esophageal tube tip and side port overlie the proximal stomach. There are multiple dilated loops of small bowel measuring up to 4.5 cm consistent with Jarely Juncaj bowel obstruction. IMPRESSION: Esophageal tube tip and side port overlie the proximal stomach. Small bowel distension suspicious for obstruction Electronically Signed   By: Donavan Foil M.D.   On: 01/20/2021 17:29    Microbiology: Recent Results (from the past 240 hour(s))  Resp Panel by RT-PCR (Flu Laritza Vokes&B, Covid) Nasopharyngeal Swab     Status: None   Collection Time: 01/20/21 11:54 AM   Specimen: Nasopharyngeal Swab; Nasopharyngeal(NP) swabs in vial transport medium  Result Value Ref Range Status   SARS Coronavirus 2 by RT PCR NEGATIVE NEGATIVE Final    Comment: (NOTE) SARS-CoV-2 target nucleic acids are NOT DETECTED.  The SARS-CoV-2 RNA is generally detectable in upper respiratory specimens during the acute phase of infection. The lowest concentration of SARS-CoV-2 viral copies this assay can detect is 138 copies/mL. Kharizma Lesnick negative result does not preclude SARS-Cov-2 infection and should not be used as the sole basis for treatment or other patient management decisions. Seth Friedlander negative result may occur with  improper specimen collection/handling, submission of specimen other than nasopharyngeal swab, presence of viral mutation(s) within the areas targeted by this assay, and inadequate number of viral copies(<138 copies/mL). Alexina Niccoli negative result must be combined with clinical observations, patient history, and epidemiological information. The expected result is Negative.  Fact Sheet for Patients:  EntrepreneurPulse.com.au  Fact Sheet for Healthcare Providers:  IncredibleEmployment.be  This test is no t yet approved or cleared by the [place] FDA and  has been authorized for  detection and/or diagnosis of SARS-CoV-2 by FDA under an Emergency Use Authorization (EUA). This EUA will remain  in effect (meaning this test can be used) for the duration of the COVID-19 declaration under Section 564(b)(1) of the Act, 21 U.S.C.section 360bbb-3(b)(1), unless the authorization is terminated  or revoked sooner.       Influenza Rodderick Holtzer by PCR NEGATIVE NEGATIVE Final   Influenza B by PCR NEGATIVE NEGATIVE Final    Comment: (NOTE) The Xpert Xpress SARS-CoV-2/FLU/RSV plus assay is intended as an aid in the diagnosis of influenza from Nasopharyngeal swab specimens and should not be used as Myliah Medel sole basis for treatment. Nasal washings and aspirates are unacceptable for Xpert Xpress SARS-CoV-2/FLU/RSV testing.  Fact Sheet for Patients: EntrepreneurPulse.com.au  Fact Sheet for Healthcare Providers: IncredibleEmployment.be  This test is not yet approved or cleared by the [place] FDA and has been authorized for detection and/or diagnosis of SARS-CoV-2 by FDA under an Emergency Use Authorization (EUA). This EUA will remain in  effect (meaning this test can be used) for the duration of the COVID-19 declaration under Section 564(b)(1) of the Act, 21 U.S.C. section 360bbb-3(b)(1), unless the authorization is terminated or revoked.  Performed at [facility], [address] [place], [place] [address]      Labs: Basic Metabolic Panel: Recent Labs  Lab 01/20/21 1154 01/21/21 0353 01/22/21 0523  NA 140 142 137  K 4.1 3.7 3.7  CL 107 108 108  CO2 27 26 25   GLUCOSE 101* 79 90  BUN 14 13 9   CREATININE 0.83 0.81 0.68  CALCIUM 9.0 8.3* 8.2*  MG  --  2.1 1.9  PHOS  --  4.0 3.0   Liver Function Tests: Recent Labs  Lab 01/20/21 1413 01/21/21 0353 01/22/21 0523  AST 17 14* 14*  ALT 16 14 13   ALKPHOS 82 70 71  BILITOT 1.0 1.1 0.9  PROT 7.0 6.0* 5.9*  ALBUMIN 4.2 3.5 3.3*   No results for input(s): LIPASE, AMYLASE in  the last 168 hours. No results for input(s): AMMONIA in the last 168 hours. CBC: Recent Labs  Lab 01/20/21 1154 01/21/21 0353 01/22/21 0523  WBC 6.6 6.1 6.1  NEUTROABS  --   --  3.7  HGB 15.2 13.1 12.8*  HCT 45.2 39.6 38.4*  MCV 90.9 91.9 90.4  PLT 236 214 187   Cardiac Enzymes: No results for input(s): CKTOTAL, CKMB, CKMBINDEX, TROPONINI in the last 168 hours. BNP: BNP (last 3 results) No results for input(s): BNP in the last 8760 hours.  ProBNP (last 3 results) No results for input(s): PROBNP in the last 8760 hours.  CBG: Recent Labs  Lab 01/21/21 0730 01/21/21 1141 01/21/21 1538  GLUCAP 63* 105* 115*       Signed:  Fayrene Helper MD.  [facility] 01/22/2021, 1:45 PM

## 2021-01-22 NOTE — Care Management Important Message (Signed)
Important Message  Patient Details  Name: Tony Franco MRN: [id number] Date of Birth: July 17, 1950   Medicare Important Message Given:  N/A - LOS <3 / Initial given by admissions     Juliann Pulse A Branda Chaudhary 01/22/2021, 10:11 AM

## 2021-01-25 ENCOUNTER — Telehealth: Payer: Self-pay

## 2021-01-25 NOTE — Telephone Encounter (Signed)
PT reports that he is no longer with the practice- sees Dr.Anderson at [facility] now

## 2021-02-03 DIAGNOSIS — M7582 Other shoulder lesions, left shoulder: Secondary | ICD-10-CM | POA: Diagnosis not present

## 2021-02-03 DIAGNOSIS — M25512 Pain in left shoulder: Secondary | ICD-10-CM | POA: Diagnosis not present

## 2021-02-03 DIAGNOSIS — M7542 Impingement syndrome of left shoulder: Secondary | ICD-10-CM | POA: Diagnosis not present

## 2021-04-05 ENCOUNTER — Other Ambulatory Visit: Payer: Self-pay

## 2021-04-05 ENCOUNTER — Ambulatory Visit: Payer: 59

## 2021-04-05 ENCOUNTER — Encounter: Payer: Self-pay | Admitting: Nurse Practitioner

## 2021-04-05 ENCOUNTER — Ambulatory Visit: Payer: Self-pay | Admitting: Nurse Practitioner

## 2021-04-05 DIAGNOSIS — Z113 Encounter for screening for infections with a predominantly sexual mode of transmission: Secondary | ICD-10-CM

## 2021-04-05 LAB — HEPATITIS B SURFACE ANTIGEN

## 2021-04-05 LAB — HM HIV SCREENING LAB: HM HIV Screening: NEGATIVE

## 2021-04-05 LAB — HM HEPATITIS C SCREENING LAB: HM Hepatitis Screen: NEGATIVE

## 2021-04-05 NOTE — Progress Notes (Signed)
Pt here for STD screening.  Condoms declined.  Windle Guard, RN

## 2021-04-05 NOTE — Progress Notes (Addendum)
[facility] Department STI clinic/screening visit  Subjective:  Tony Franco is a 71 y.o. male being seen today for an STI screening visit. The patient reports they do have symptoms.    Patient has the following medical conditions:   Patient Active Problem List   Diagnosis Date Noted   Depression 01/21/2021   PREP 01/21/2021   Abnormal CXR 01/21/2021   Anterolisthesis of lumbar spine 01/21/2021   SBO (small bowel obstruction) (Tony Franco) 01/20/2021   Neurosyphilis 05/11/2020   H/O vertigo 03/12/2020   Severe recurrent major depression without psychotic features (Tony Franco) 12/13/2019   Symptoms of depression 05/30/2019   BPH with obstruction/lower urinary tract symptoms 06/22/2017   Mixed hyperlipidemia 06/20/2017   Right lateral epicondylitis 08/04/2016   Screening for prostate cancer 08/04/2016   Osteoarthritis of knees, bilateral 02/11/2016   ED (erectile dysfunction) 04/20/2015   Insomnia 04/20/2015   Paresthesia 03/16/2015   RLS (restless legs syndrome) 03/16/2015   Colon cancer screening 03/16/2015     Chief Complaint  Patient presents with   SEXUALLY TRANSMITTED DISEASE    Screening    HPI  Patient reports to clinic today for STD screening.  Patient states he thinks his partner may have an STD, but is unsure of what type.  Patient has not spoken to partner in a couple of days.  Patient reports a blister around his rectum and some discomfort in his penis that started about one week ago.  Does the patient or their partner desires a pregnancy in the next year? No  Screening for MPX risk: Does the patient have an unexplained rash? No Is the patient MSM? Yes Does the patient endorse multiple sex partners or anonymous sex partners? No Did the patient have close or sexual contact with a person diagnosed with MPX? No Has the patient traveled outside the [place] where MPX is endemic? No Is there a high clinical suspicion for MPX-- evidenced by one of the following  No  -Unlikely to be chickenpox  -Lymphadenopathy  -Rash that present in same phase of evolution on any given body part   See flowsheet for further details and programmatic requirements.    The following portions of the patient's history were reviewed and updated as appropriate: allergies, current medications, past medical history, past social history, past surgical history and problem list.  Objective:  There were no vitals filed for this visit.  Physical Exam Constitutional:      Appearance: Normal appearance.  HENT:     Head: Normocephalic.     Right Ear: External ear normal.     Left Ear: External ear normal.     Nose: Nose normal.     Mouth/Throat:     Mouth: Mucous membranes are moist.     Comments: Silver caps noted to molars.  Poor dentition.  Patient unsure of last dental exam.  Pulmonary:     Effort: Pulmonary effort is normal.  Abdominal:     General: Abdomen is flat.     Palpations: Abdomen is soft.  Genitourinary:    Penis: Uncircumcised.      Comments: Pubic area without nits, lice, hair loss, edema, erythema, lesions and inguinal adenopathy. Penis without rash, lesions and discharge at meatus. Testicles descended bilaterally,nt, no masses or edema.  Musculoskeletal:     Cervical back: Full passive range of motion without pain, normal range of motion and neck supple.  Lymphadenopathy:     Lower Body: Right inguinal adenopathy present. Left inguinal adenopathy present.  Skin:  General: Skin is warm and dry.     Comments: Poison Ivy rash noted to forearms and lower abdomen.  Neurological:     Mental Status: He is alert and oriented to person, place, and time.  Psychiatric:        Attention and Perception: Attention normal.        Mood and Affect: Mood normal.        Speech: Speech normal.        Behavior: Behavior is cooperative.      Assessment and Plan:  Tony Franco is a 71 y.o. male presenting to the [facility] for STI  screening  1. Screening examination for venereal disease -71 year old male in clinic today for STD screening. Patient reports a blister around his rectum and some discomfort in his penis that started about one week ago. On exam no open blister or lesion visualized, site healed.  -Poison ivy rash noted to arms advised patient to use calamine lotion to areas and essential oils such as peppermint.  -Patient does have STI symptoms Patient accepted all screenings including  oral, urine, rectal CT/GC and bloodwork for HIV/RPR, HBV/HCV Patient meets criteria for HepB screening? Yes. Ordered? Yes Patient meets criteria for HepC screening? Yes. Ordered? Yes Recommended condom use with all sex Discussed importance of condom use for STI prevent   Discussed time line for State [facility] results and that patient will be called with positive results and encouraged patient to call if he had not heard in 2 weeks Recommended returning for continued or worsening symptoms.    - HIV/HCV [facility] - Syphilis Serology, [place] [facility] - Chlamydia/Gonorrhea [place] [facility] - Chlamydia/Gonorrhea [place] [facility] - HBV Antigen/Antibody [facility]     Return if symptoms worsen or fail to improve.   Gregary Cromer, FNP

## 2021-04-13 ENCOUNTER — Telehealth: Payer: Self-pay

## 2021-04-13 NOTE — Telephone Encounter (Signed)
Phone call to [phone]. Left message on voicemail that [place], RN with [facility], is calling regarding TR. Please return call to [phone]. ? ?Also sent MyChart message. ?

## 2021-04-13 NOTE — Telephone Encounter (Signed)
Calling pt regarding positive chlamydia result from 04/05/21 rectal specimen. ? ?

## 2021-04-14 ENCOUNTER — Telehealth: Payer: Self-pay | Admitting: Family Medicine

## 2021-04-14 NOTE — Telephone Encounter (Signed)
Phone call to [phone]. Left message on voicemail that RN is returning phone call, and need to discuss TR. Please call Ronnette Rump at [phone]. ?(New phone note started 04/14/21 when pt called [facility].) ?

## 2021-04-14 NOTE — Telephone Encounter (Signed)
Phone call to [phone]. Left message on voicemail that RN is returning phone call, and need to discuss TR. Please call Geanette Buonocore at [phone]. ?

## 2021-04-14 NOTE — Telephone Encounter (Signed)
Pt would like to speak with a nurse regarding his test results as well as some additional symptoms that he is having and wanting treatment for. ?

## 2021-04-15 ENCOUNTER — Ambulatory Visit: Payer: 59

## 2021-04-15 NOTE — Telephone Encounter (Addendum)
Phone call to pt. Pt confirmed password from last visit.  Pt counseled about positive chlamydia results.  Answered his questions about tx.   ?Pt states he has Rx for Doxycycline BID x 1 week to [facility]. Plans on picking up Rx today.  He cannot tell from his text message if it is 100 mg tablets; he will confirm.  Received his Rx from [place], where he gets prep. ?Confirmed pt was counseled: NO sex for 14 days from time start med, partners must be treated, TOC in 3 months. States partners already notified. ?

## 2021-04-15 NOTE — Telephone Encounter (Signed)
Phone call to pt. Pt confirmed password from last visit.  Pt counseled about positive chlamydia results.  Answered his questions about tx.   ?Pt states he has Rx for Doxycycline BID x 1 week to [facility]. Plans on picking up Rx today.  He cannot tell from his text message if it is 100 mg tablets; he will confirm.  Received his Rx from [place], where he gets prep. ?Confirmed pt was counseled: NO sex for 14 days from time start med, partners must be treated, TOC in 3 months. States partners already notified. ?

## 2021-04-16 ENCOUNTER — Telehealth: Payer: Self-pay

## 2021-04-16 NOTE — Telephone Encounter (Signed)
Phone call to pt. Pt confirmed that the Doxycycline is 100 mg tablets. ?

## 2021-04-16 NOTE — Telephone Encounter (Signed)
Calling pt regarding 04/05/21 syphilis titer result of 1:4. ? ?Per Dr. Romona Curls orders written on TR, no treatment indicated, baseline titer. (Prior positive on 08/07/20 was 1:64, had been treated by ID for neurosyphilis.) ? ?Phone call to pt. Pt counseled about result and questions answered. ?

## 2021-08-16 ENCOUNTER — Ambulatory Visit: Payer: Medicare Other | Admitting: Nurse Practitioner

## 2021-08-16 ENCOUNTER — Encounter: Payer: Self-pay | Admitting: Nurse Practitioner

## 2021-08-16 DIAGNOSIS — Z113 Encounter for screening for infections with a predominantly sexual mode of transmission: Secondary | ICD-10-CM

## 2021-08-16 LAB — HEPATITIS B SURFACE ANTIGEN

## 2021-08-16 LAB — HM HIV SCREENING LAB: HM HIV Screening: NEGATIVE

## 2021-08-16 LAB — HM HEPATITIS C SCREENING LAB: HM Hepatitis Screen: NEGATIVE

## 2021-08-16 NOTE — Progress Notes (Signed)
[facility] Department STI clinic/screening visit  Subjective:  Tony Franco is a 71 y.o. male being seen today for an STI screening visit. The patient reports they do not have symptoms.    Patient has the following medical conditions:   Patient Active Problem List   Diagnosis Date Noted   Depression 01/21/2021   PREP 01/21/2021   Abnormal CXR 01/21/2021   Anterolisthesis of lumbar spine 01/21/2021   SBO (small bowel obstruction) ([place]) 01/20/2021   Neurosyphilis 05/11/2020   H/O vertigo 03/12/2020   Severe recurrent major depression without psychotic features ([place]) 12/13/2019   Symptoms of depression 05/30/2019   BPH with obstruction/lower urinary tract symptoms 06/22/2017   Mixed hyperlipidemia 06/20/2017   Right lateral epicondylitis 08/04/2016   Screening for prostate cancer 08/04/2016   Osteoarthritis of knees, bilateral 02/11/2016   ED (erectile dysfunction) 04/20/2015   Insomnia 04/20/2015   Paresthesia 03/16/2015   RLS (restless legs syndrome) 03/16/2015   Colon cancer screening 03/16/2015     Chief Complaint  Patient presents with   SEXUALLY TRANSMITTED DISEASE    screening    HPI  Patient reports to clinic for STD screening.  Patient states he would like Syphilis follow up due to a previous history of Syphilis.  Patient reports being asymptomatic.    Does the patient or their partner desires a pregnancy in the next year? No  Screening for MPX risk: Does the patient have an unexplained rash? No Is the patient MSM? No Does the patient endorse multiple sex partners or anonymous sex partners? No Did the patient have close or sexual contact with a person diagnosed with MPX? No Has the patient traveled outside the [place] where MPX is endemic? No Is there a high clinical suspicion for MPX-- evidenced by one of the following No  -Unlikely to be chickenpox  -Lymphadenopathy  -Rash that present in same phase of evolution on any given body part   See  flowsheet for further details and programmatic requirements.   Immunization History  Administered Date(s) Administered   Influenza,inj,Quad PF,6+ Mos 03/16/2015, 11/10/2015, 02/09/2017, 01/15/2018   PFIZER(Purple Top)SARS-COV-2 Vaccination 05/09/2019, 06/06/2019   Pneumococcal Conjugate-13 03/12/2020   Tdap 08/04/2016     The following portions of the patient's history were reviewed and updated as appropriate: allergies, current medications, past medical history, past social history, past surgical history and problem list.  Objective:  There were no vitals filed for this visit.  Physical Exam Constitutional:      Appearance: Normal appearance.  HENT:     Head: Normocephalic. No abrasion, masses or laceration. Hair is normal.     Mouth/Throat:     Lips: Pink.     Mouth: Mucous membranes are moist. No oral lesions.     Dentition: No dental caries.     Pharynx: No pharyngeal swelling, oropharyngeal exudate, posterior oropharyngeal erythema or uvula swelling.     Tonsils: No tonsillar exudate or tonsillar abscesses.  Eyes:     General: Lids are normal.        Right eye: No discharge.        Left eye: No discharge.     Conjunctiva/sclera: Conjunctivae normal.     Right eye: No exudate.    Left eye: No exudate. Abdominal:     General: Abdomen is flat.     Palpations: Abdomen is soft.     Tenderness: There is no abdominal tenderness. There is no rebound.  Genitourinary:    Pubic Area: No rash or pubic  lice.      Penis: Normal and uncircumcised. No erythema or discharge.      Testes: Normal.        Right: Mass or tenderness not present.        Left: Mass or tenderness not present.     Rectum: Normal.     Comments: Discharge amount: None Color: None Musculoskeletal:     Cervical back: Full passive range of motion without pain, normal range of motion and neck supple.  Lymphadenopathy:     Cervical: No cervical adenopathy.     Right cervical: No superficial, deep or posterior  cervical adenopathy.    Left cervical: No superficial, deep or posterior cervical adenopathy.     Upper Body:     Right upper body: No supraclavicular, axillary or epitrochlear adenopathy.     Left upper body: No supraclavicular, axillary or epitrochlear adenopathy.     Lower Body: No right inguinal adenopathy. No left inguinal adenopathy.  Skin:    General: Skin is warm and dry.     Findings: No lesion or rash.  Neurological:     Mental Status: He is alert and oriented to person, place, and time.  Psychiatric:        Attention and Perception: Attention normal.        Mood and Affect: Mood normal.        Behavior: Behavior normal. Behavior is cooperative.       Assessment and Plan:  Tony Franco is a 71 y.o. male presenting to the [place] for STI screening  1. Screening examination for venereal disease -71 year old male in clinic today for STD screening. -Patient does not have STI symptoms Patient accepted all screenings including  oral, rectal CT/GC and bloodwork for HIV/RPR.  Patient meets criteria for HepB screening? Yes. Ordered? Yes Patient meets criteria for HepC screening? Yes. Ordered? Yes Recommended condom use with all sex Discussed importance of condom use for STI prevent   Discussed time line for State Lab results and that patient will be called with positive results and encouraged patient to call if he had not heard in 2 weeks Recommended returning for continued or worsening symptoms.    - HIV/HCV [place] [facility] - Syphilis Serology, [place] [facility] - HBV Antigen/Antibody [facility] Lab - [place] [facility]     Return if symptoms worsen or fail to improve.    Gregary Cromer, FNP

## 2021-08-16 NOTE — Progress Notes (Signed)
Pt here for STI screening.  No gram stain performed.  Condoms declined.-Forest Becker, RN

## 2021-12-02 IMAGING — MR MR HEAD W/O CM
13 series · 48 of 48 positions shown · non-contrast
Comparison: None.

CLINICAL DATA: Dizziness

EXAM:
MRI HEAD WITHOUT CONTRAST
TECHNIQUE: Multiplanar, multiecho pulse sequences of the brain and surrounding
structures were obtained without intravenous contrast.

[Series 5: ax dwi_tracew · axial · 3.0mm · 0.60mm/px · z∈[-58,+96]mm · 5 of 96 slices shown]
[im 1/96]
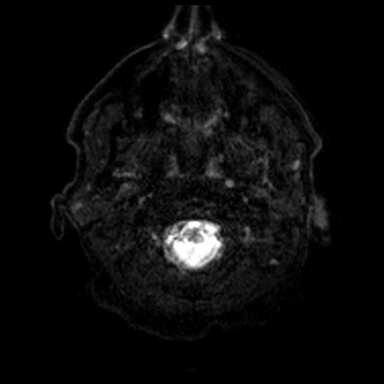
[im 24/96]
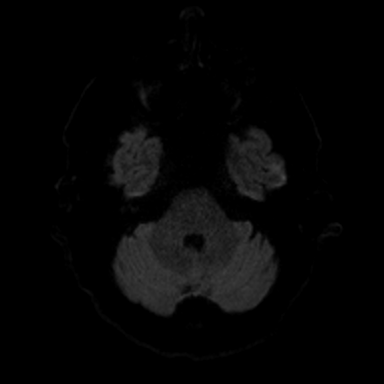
[im 48/96]
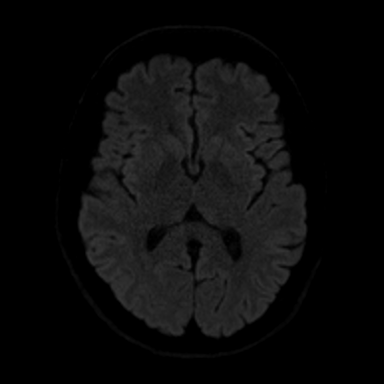
[im 72/96]
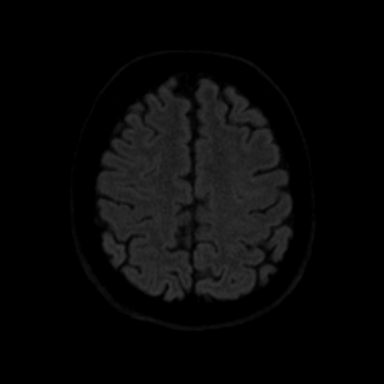
[im 96/96]
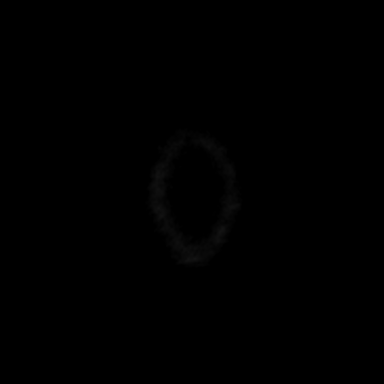

[Series 6: ax dwi_adc · axial · 3.0mm · 0.60mm/px · z∈[-58,+96]mm · 3 of 48 slices shown]
[im 1/48]
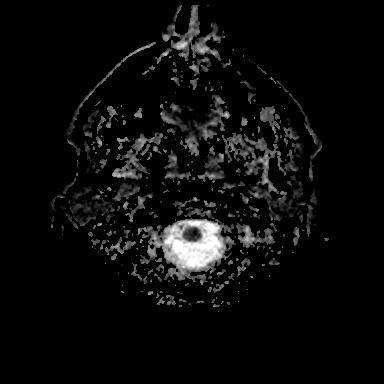
[im 24/48]
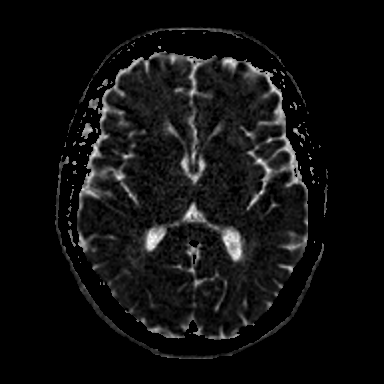
[im 48/48]
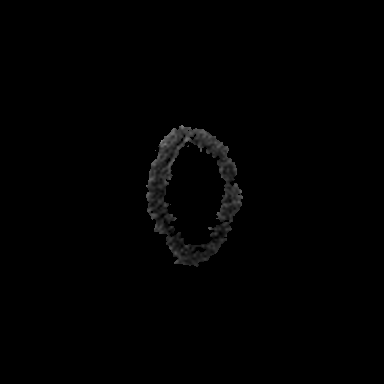

[Series 7: cor dwi_tracew · coronal · 5.0mm · 1.31mm/px · 5 of 76 slices shown]
[im 1/76]
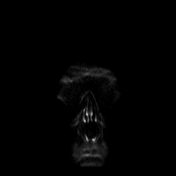
[im 19/76]
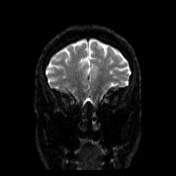
[im 38/76]
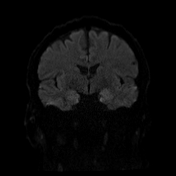
[im 57/76]
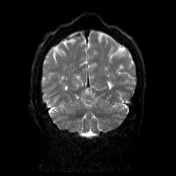
[im 76/76]
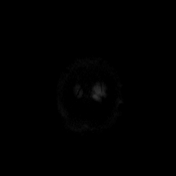

[Series 8: cor dwi_adc · coronal · 5.0mm · 1.31mm/px · 2 of 38 slices shown]
[im 1/38]
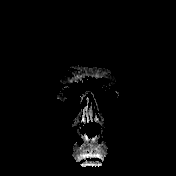
[im 38/38]
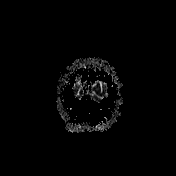

[Series 9: T1 · sagittal · 5.0mm · 0.62mm/px · 1 of 25 slices shown (1 of 2)]
[im 1/25]
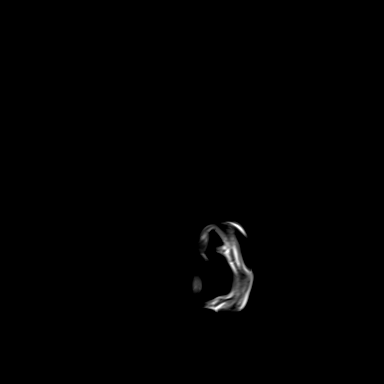

[Series 10: T2 · axial · 5.0mm · 0.53mm/px · z∈[-59,+97]mm · 2 of 27 slices shown (1 of 2)]
[im 1/27]
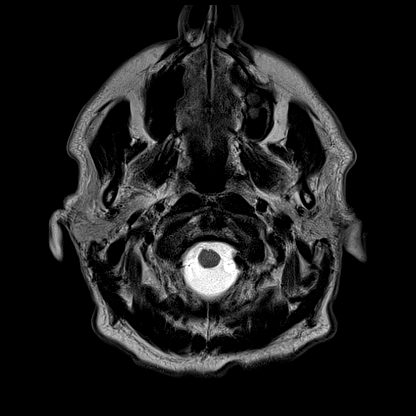
[im 27/27]
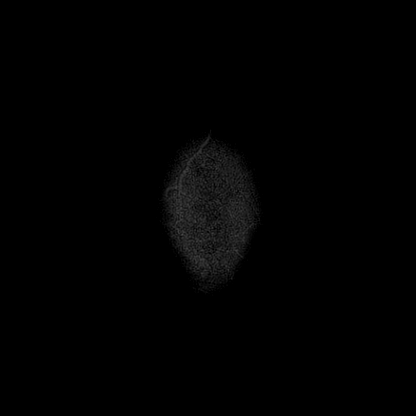

[Series 11: mag_images · axial · 3.0mm · 0.90mm/px · z∈[-70,+107]mm · 4 of 60 slices shown]
[im 1/60]
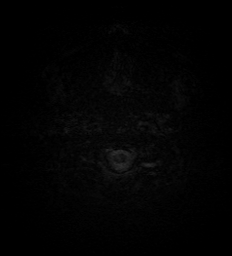
[im 20/60]
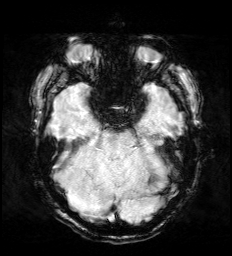
[im 40/60]
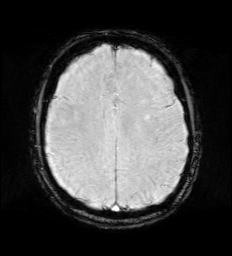
[im 60/60]
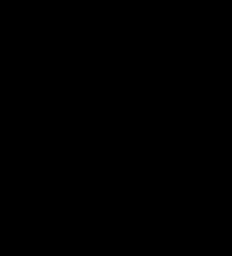

[Series 12: pha_images · axial · 3.0mm · 0.90mm/px · z∈[-70,+95]mm · 3 of 56 slices shown]
[im 1/56]
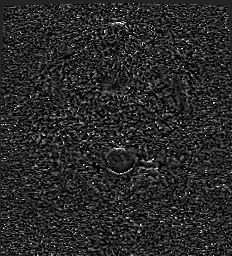
[im 28/56]
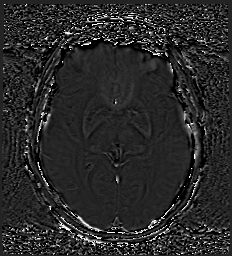
[im 56/56]
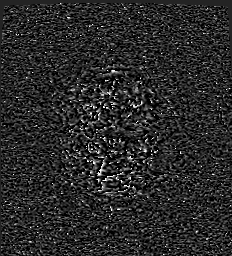

[Series 13: swi_images · axial · 3.0mm · 0.90mm/px · z∈[-70,+107]mm · 4 of 60 slices shown]
[im 1/60]
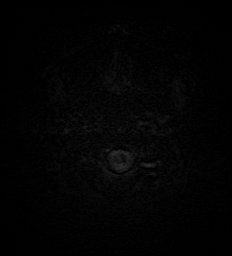
[im 20/60]
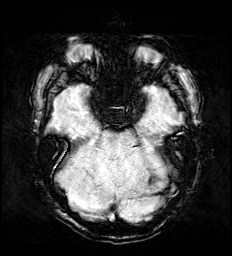
[im 40/60]
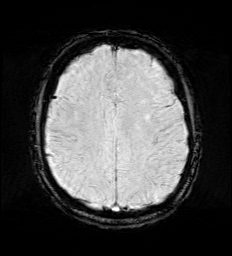
[im 60/60]
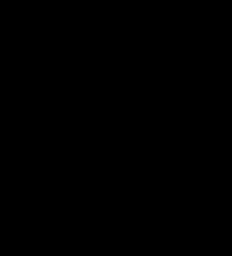

[Series 14: mip_images(sw) · axial · 24.0mm · 0.90mm/px · z∈[-59,+96]mm · 3 of 53 slices shown]
[im 1/53]
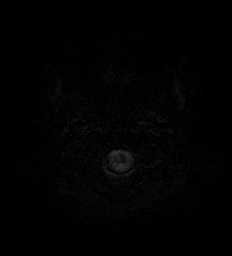
[im 27/53]
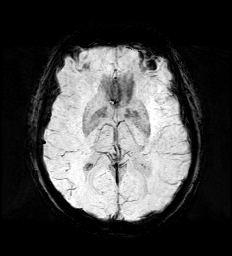
[im 53/53]
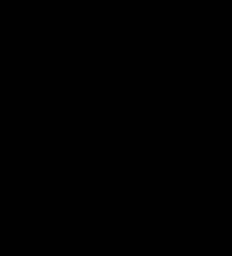

[Series 15: FLAIR · axial · 3.0mm · 0.53mm/px · z∈[-62,+100]mm · 3 of 55 slices shown]
[im 1/55]
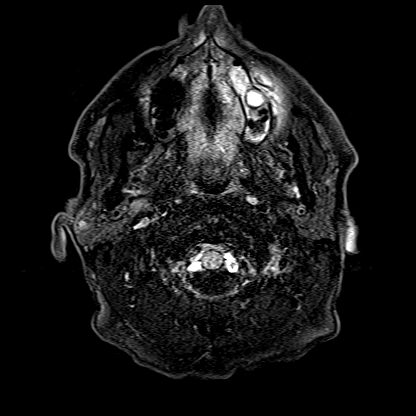
[im 28/55]
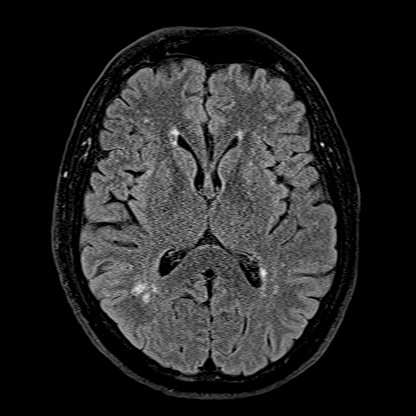
[im 55/55]
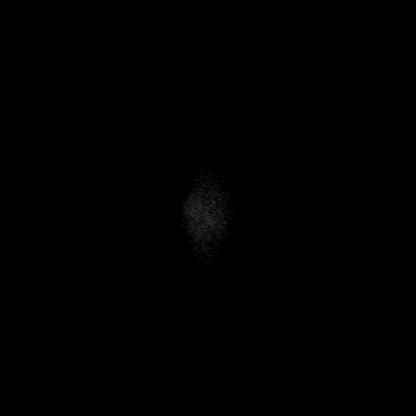

[Series 16: T1 · axial · 1.0mm · 0.98mm/px · z∈[-69,+106]mm · 11 of 176 slices shown (2 of 2)]
[im 1/176]
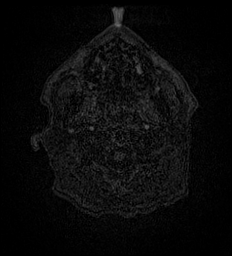
[im 18/176]
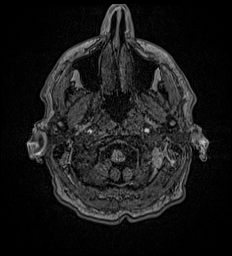
[im 36/176]
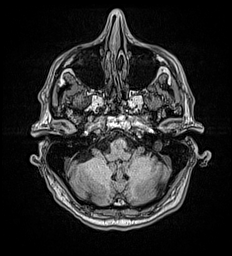
[im 53/176]
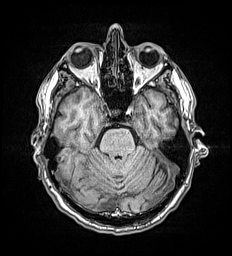
[im 71/176]
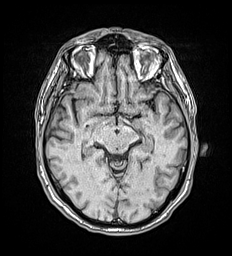
[im 88/176]
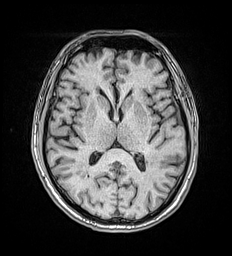
[im 106/176]
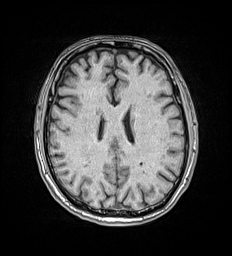
[im 123/176]
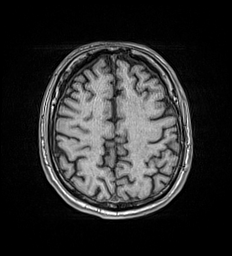
[im 141/176]
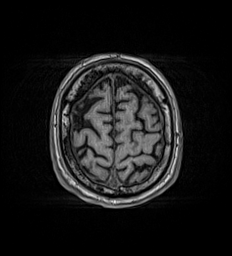
[im 158/176]
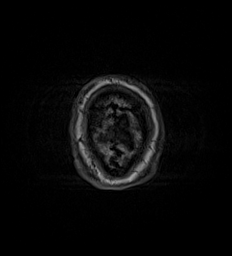
[im 176/176]
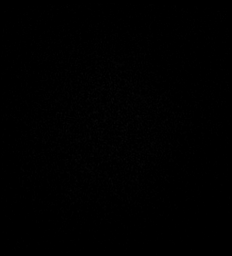

[Series 17: T2 · coronal · 5.0mm · 0.45mm/px · 2 of 31 slices shown (2 of 2)]
[im 1/31]
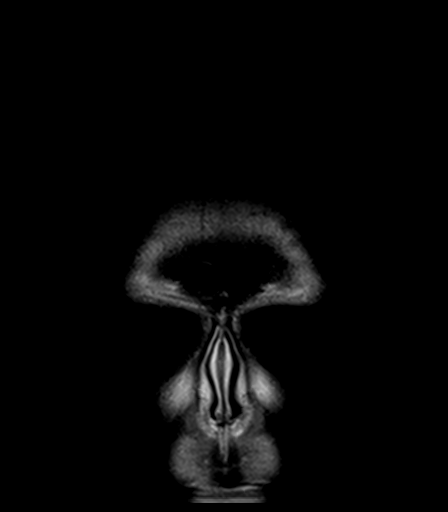
[im 31/31]
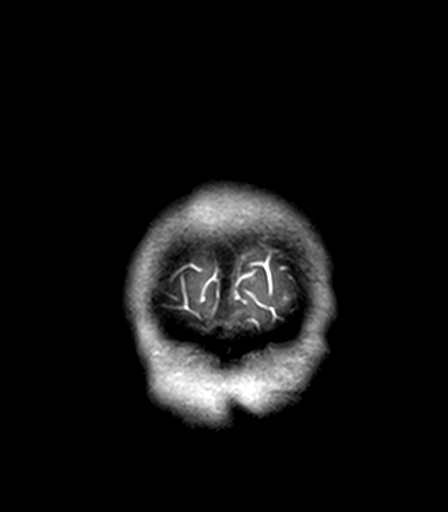

[48 of 48 positions shown; findings below may reference images not displayed]

FINDINGS: Brain: There is no acute infarction or intracranial hemorrhage.
There is no intracranial mass, mass effect, or edema. There is no
hydrocephalus or extra-axial fluid collection. Patchy foci of T2
hyperintensity in the supratentorial white matter are nonspecific
but may reflect mild chronic microvascular ischemic changes.
Ventricles and sulci are normal in size and configuration.

Vascular: Major vessel flow voids at the skull base are preserved.

Skull and upper cervical spine: Normal marrow signal is preserved.

Sinuses/Orbits: Trace mucosal thickening.  Orbits are unremarkable.

Other: Sella is unremarkable.  Patchy mastoid fluid opacification.
IMPRESSION: No evidence of recent infarction, hemorrhage, or mass. Mild chronic
microvascular ischemic changes.

## 2022-01-05 ENCOUNTER — Telehealth: Payer: Medicare Other | Admitting: Family Medicine

## 2022-01-05 DIAGNOSIS — Z4802 Encounter for removal of sutures: Secondary | ICD-10-CM

## 2022-01-05 NOTE — Progress Notes (Signed)
Tony Franco  Will be going in person to have stitches removed at local urgent care.

## 2022-01-07 ENCOUNTER — Other Ambulatory Visit: Payer: Self-pay | Admitting: Student

## 2022-01-07 DIAGNOSIS — D1722 Benign lipomatous neoplasm of skin and subcutaneous tissue of left arm: Secondary | ICD-10-CM

## 2022-01-07 DIAGNOSIS — M7542 Impingement syndrome of left shoulder: Secondary | ICD-10-CM

## 2022-01-07 DIAGNOSIS — M7582 Other shoulder lesions, left shoulder: Secondary | ICD-10-CM

## 2022-01-07 DIAGNOSIS — M7522 Bicipital tendinitis, left shoulder: Secondary | ICD-10-CM

## 2022-01-18 ENCOUNTER — Ambulatory Visit: Admission: RE | Admit: 2022-01-18 | Payer: Medicare Other | Source: Ambulatory Visit

## 2022-01-20 ENCOUNTER — Ambulatory Visit
Admission: RE | Admit: 2022-01-20 | Discharge: 2022-01-20 | Disposition: A | Discharge status: Home / Self Care | Payer: Medicare Other | Source: Ambulatory Visit | Attending: Student | Admitting: Student

## 2022-01-20 DIAGNOSIS — M7582 Other shoulder lesions, left shoulder: Secondary | ICD-10-CM | POA: Insufficient documentation

## 2022-01-20 DIAGNOSIS — M7542 Impingement syndrome of left shoulder: Secondary | ICD-10-CM | POA: Diagnosis present

## 2022-01-20 DIAGNOSIS — D1722 Benign lipomatous neoplasm of skin and subcutaneous tissue of left arm: Secondary | ICD-10-CM | POA: Diagnosis present

## 2022-01-20 DIAGNOSIS — M7522 Bicipital tendinitis, left shoulder: Secondary | ICD-10-CM | POA: Insufficient documentation

## 2022-02-23 ENCOUNTER — Ambulatory Visit: Payer: Self-pay | Admitting: Family Medicine

## 2022-02-23 ENCOUNTER — Encounter: Payer: Self-pay | Admitting: Family Medicine

## 2022-02-23 DIAGNOSIS — Z113 Encounter for screening for infections with a predominantly sexual mode of transmission: Secondary | ICD-10-CM

## 2022-02-23 DIAGNOSIS — Z8619 Personal history of other infectious and parasitic diseases: Secondary | ICD-10-CM

## 2022-02-23 LAB — HEPATITIS B SURFACE ANTIGEN

## 2022-02-23 LAB — GRAM STAIN

## 2022-02-23 LAB — HM HEPATITIS C SCREENING LAB: HM Hepatitis Screen: NEGATIVE

## 2022-02-23 LAB — HM HIV SCREENING LAB: HM HIV Screening: NEGATIVE

## 2022-02-23 NOTE — Addendum Note (Signed)
Addended by: Sharlet Salina on: 02/23/2022 10:14 AM   Modules accepted: Orders

## 2022-02-23 NOTE — Progress Notes (Signed)
[facility] Department STI clinic/screening visit  Subjective:  Tony Franco is a 72 y.o. male being seen today for an STI screening visit. The patient reports they do have symptoms.    Patient has the following medical conditions:   Patient Active Problem List   Diagnosis Date Noted   Depression 01/21/2021   PREP 01/21/2021   Abnormal CXR 01/21/2021   Anterolisthesis of lumbar spine 01/21/2021   SBO (small bowel obstruction) ([place]) 01/20/2021   Neurosyphilis 05/11/2020   H/O vertigo 03/12/2020   Severe recurrent major depression without psychotic features ([place]) 12/13/2019   Symptoms of depression 05/30/2019   BPH with obstruction/lower urinary tract symptoms 06/22/2017   Mixed hyperlipidemia 06/20/2017   Right lateral epicondylitis 08/04/2016   Screening for prostate cancer 08/04/2016   Osteoarthritis of knees, bilateral 02/11/2016   ED (erectile dysfunction) 04/20/2015   Insomnia 04/20/2015   Paresthesia 03/16/2015   RLS (restless legs syndrome) 03/16/2015   Colon cancer screening 03/16/2015     Chief Complaint  Patient presents with   SEXUALLY TRANSMITTED DISEASE    Screening- patient complaining of tingling in testicles and itching in penis and a rash around rectum     HPI  Patient reports to clinic for STI testing. Had a positive gonorrhea test in October- treated at [facility] with Cefixime. Had a gonorrhea TOC 2 weeks ago- this was negative. Wants repeat testing to be sure.   Last HIV test per patient/review of record was  Lab Results  Component Value Date   HMHIVSCREEN Negative - Validated 08/16/2021    Lab Results  Component Value Date   HIV NON-REACTIVE 12/04/2020    Does the patient or their partner desires a pregnancy in the next year? No  Screening for MPX risk: Does the patient have an unexplained rash? No Is the patient MSM? Yes Does the patient endorse multiple sex partners or anonymous sex partners? No Did the patient have close or sexual  contact with a person diagnosed with MPX? No Has the patient traveled outside the [place] where MPX is endemic? No Is there a high clinical suspicion for MPX-- evidenced by one of the following No  -Unlikely to be chickenpox  -Lymphadenopathy  -Rash that present in same phase of evolution on any given body part   See flowsheet for further details and programmatic requirements.   Immunization History  Administered Date(s) Administered   Influenza,inj,Quad PF,6+ Mos 03/16/2015, 11/10/2015, 02/09/2017, 01/15/2018   PFIZER(Purple Top)SARS-COV-2 Vaccination 05/09/2019, 06/06/2019   Pneumococcal Conjugate-13 03/12/2020   Tdap 08/04/2016     The following portions of the patient's history were reviewed and updated as appropriate: allergies, current medications, past medical history, past social history, past surgical history and problem list.  Objective:  There were no vitals filed for this visit.  Physical Exam Constitutional:      Appearance: Normal appearance.  HENT:     Head: Normocephalic and atraumatic.     Comments: No nits or hair loss    Mouth/Throat:     Mouth: Mucous membranes are moist. No oral lesions.     Pharynx: Oropharynx is clear. No oropharyngeal exudate or posterior oropharyngeal erythema.  Eyes:     General:        Right eye: No discharge.        Left eye: No discharge.     Conjunctiva/sclera:     Right eye: Right conjunctiva is not injected. No exudate.    Left eye: Left conjunctiva is not injected. No exudate.  Pulmonary:     Effort: Pulmonary effort is normal.  Abdominal:     General: Abdomen is flat.     Palpations: Abdomen is soft. There is no hepatomegaly or mass.     Tenderness: There is no abdominal tenderness. There is no rebound.     Hernia: There is no hernia in the left inguinal area or right inguinal area.  Genitourinary:    Pubic Area: No rash or pubic lice (no nits).      Penis: Normal. No tenderness, discharge, swelling or lesions.      Testes:  Normal.     Epididymis:     Right: Normal. No mass or tenderness.     Left: Normal. No mass or tenderness.     Rectum: Normal. No tenderness (no lesions or discharge).     Comments: Penile Discharge Amount: none Color:  na Lymphadenopathy:     Head:     Right side of head: No preauricular or posterior auricular adenopathy.     Left side of head: No preauricular or posterior auricular adenopathy.     Cervical: No cervical adenopathy.     Upper Body:     Right upper body: No supraclavicular, axillary or epitrochlear adenopathy.     Left upper body: No supraclavicular, axillary or epitrochlear adenopathy.     Lower Body: No right inguinal adenopathy. No left inguinal adenopathy.  Skin:    General: Skin is warm and dry.     Findings: No lesion or rash.  Neurological:     Mental Status: He is alert and oriented to person, place, and time.  Psychiatric:        Mood and Affect: Mood normal.        Behavior: Behavior normal.       Assessment and Plan:  Tony Franco is a 72 y.o. male presenting to the [facility] Department for STI screening  1. Screening for venereal disease   - Chlamydia/GC NAA, Confirmation - HIV/HCV [place] [facility] - Chlamydia/Gonorrhea [place] [facility] - Syphilis Serology, [place] [facility] - HBV Antigen/Antibody [facility] - Chlamydia/Gonorrhea [place] [facility] - Gram stain  2. History of syphilis  - Syphilis Serology,  Lab   Patient does have STI symptoms Patient accepted all screenings including   Patient meets criteria for HepB screening? Yes. Ordered? yes Patient meets criteria for HepC screening? Yes. Ordered? yes Recommended condom use with all sex Discussed importance of condom use for STI prevent  Treat gram stain per standing order Discussed time line for State Lab results and that patient will be called with positive results and encouraged patient to call if he had not heard in 2 weeks Recommended returning for continued or  worsening symptoms.   Return if symptoms worsen or fail to improve.  No future appointments.  Sharlet Salina, [place]

## 2022-02-23 NOTE — Progress Notes (Signed)
Pt is here for STD screening.  Gram stain results reviewed, no treatment required.  Condoms declined.  Windle Guard, RN

## 2022-02-26 LAB — CHLAMYDIA/GC NAA, CONFIRMATION
Chlamydia trachomatis, NAA: NEGATIVE
Neisseria gonorrhoeae, NAA: NEGATIVE

## 2022-03-03 ENCOUNTER — Telehealth: Payer: Self-pay

## 2022-03-03 NOTE — Telephone Encounter (Signed)
This phone note entered in error. Please disregard.

## 2022-03-03 NOTE — Telephone Encounter (Signed)
Phone call to pt at [phone]. Pt answered and confirmed identity. Counseled pt re syphilis resutls and no need for additional tx, follow-up yearly. Pt expressed understanding.

## 2022-03-03 NOTE — Telephone Encounter (Signed)
Calling pt re syphilis result from 02/23/22 specimen: RPR = Reactive Titer = 1:2 TP= not done due to hx of syphils.  See scanned TR with written order by [facility] provider: Per Gregary Cromer, FNP, pt remains at baseline, no change from last year, No tx needed.

## 2022-08-22 ENCOUNTER — Ambulatory Visit: Payer: Medicare Other | Admitting: Family Medicine

## 2022-08-22 DIAGNOSIS — Z8619 Personal history of other infectious and parasitic diseases: Secondary | ICD-10-CM

## 2022-08-22 DIAGNOSIS — Z113 Encounter for screening for infections with a predominantly sexual mode of transmission: Secondary | ICD-10-CM

## 2022-08-22 LAB — HM HEPATITIS C SCREENING LAB: HM Hepatitis Screen: NEGATIVE

## 2022-08-22 LAB — HEPATITIS B SURFACE ANTIGEN

## 2022-08-22 LAB — HM HIV SCREENING LAB: HM HIV Screening: NEGATIVE

## 2022-08-22 NOTE — Progress Notes (Signed)
[facility] Department STI clinic/screening visit  Subjective:  Tony Franco is a 72 y.o. male being seen today for an STI screening visit. The patient reports they do not have symptoms.    Patient has the following medical conditions:   Patient Active Problem List   Diagnosis Date Noted   Depression 01/21/2021   PREP 01/21/2021   Abnormal CXR 01/21/2021   Anterolisthesis of lumbar spine 01/21/2021   SBO (small bowel obstruction) ([facility]) 01/20/2021   Neurosyphilis 05/11/2020   H/O vertigo 03/12/2020   Severe recurrent major depression without psychotic features ([facility]) 12/13/2019   Symptoms of depression 05/30/2019   BPH with obstruction/lower urinary tract symptoms 06/22/2017   Mixed hyperlipidemia 06/20/2017   Right lateral epicondylitis 08/04/2016   Screening for prostate cancer 08/04/2016   Osteoarthritis of knees, bilateral 02/11/2016   ED (erectile dysfunction) 04/20/2015   Insomnia 04/20/2015   Paresthesia 03/16/2015   RLS (restless legs syndrome) 03/16/2015   Colon cancer screening 03/16/2015     Chief Complaint  Patient presents with   SEXUALLY TRANSMITTED DISEASE    HPI  Patient reports to clinic for STI testing. States that he found some blisters or boils about 5 days ago in his rectal area. He reports that they were slightly painful- but now they're gone. He denies any other symptoms. Reports 1 new partner- no condoms. On PREP.   Last HIV test per patient/review of record was  Lab Results  Component Value Date   HMHIVSCREEN Negative - Validated 02/23/2022    Lab Results  Component Value Date   HIV NON-REACTIVE 12/04/2020    Does the patient or their partner desires a pregnancy in the next year? No  Screening for MPX risk: Does the patient have an unexplained rash? No Is the patient MSM? Yes Does the patient endorse multiple sex partners or anonymous sex partners? Yes Did the patient have close or sexual contact with a person diagnosed with  MPX? No Has the patient traveled outside the [place] where MPX is endemic? No Is there a high clinical suspicion for MPX-- evidenced by one of the following No  -Unlikely to be chickenpox  -Lymphadenopathy  -Rash that present in same phase of evolution on any given body part   See flowsheet for further details and programmatic requirements.   Immunization History  Administered Date(s) Administered   Influenza,inj,Quad PF,6+ Mos 03/16/2015, 11/10/2015, 02/09/2017, 01/15/2018   PFIZER(Purple Top)SARS-COV-2 Vaccination 05/09/2019, 06/06/2019   Pneumococcal Conjugate-13 03/12/2020   Tdap 08/04/2016     The following portions of the patient's history were reviewed and updated as appropriate: allergies, current medications, past medical history, past social history, past surgical history and problem list.  Objective:  There were no vitals filed for this visit.  Physical Exam Constitutional:      Appearance: Normal appearance.  HENT:     Head: Normocephalic and atraumatic.     Comments: No nits or hair loss    Mouth/Throat:     Mouth: Mucous membranes are moist. No oral lesions.     Pharynx: Oropharynx is clear. No oropharyngeal exudate or posterior oropharyngeal erythema.  Eyes:     General:        Right eye: No discharge.        Left eye: No discharge.     Conjunctiva/sclera:     Right eye: Right conjunctiva is not injected. No exudate.    Left eye: Left conjunctiva is not injected. No exudate. Pulmonary:     Effort: Pulmonary  effort is normal.  Abdominal:     General: Abdomen is flat.     Palpations: Abdomen is soft. There is no hepatomegaly or mass.     Tenderness: There is no abdominal tenderness. There is no rebound.     Hernia: There is no hernia in the left inguinal area or right inguinal area.  Genitourinary:    Pubic Area: No rash or pubic lice (no nits).      Penis: Normal and uncircumcised. No tenderness, discharge, swelling or lesions.      Testes: Normal.      Epididymis:     Right: Normal. No mass or tenderness.     Left: Normal. No mass or tenderness.     Rectum: Normal. No tenderness (no lesions or discharge).     Comments: Penile Discharge Amount: none Color:  none  -redness on head of penis- pt reports this is his normal Lymphadenopathy:     Head:     Right side of head: No preauricular or posterior auricular adenopathy.     Left side of head: No preauricular or posterior auricular adenopathy.     Cervical: No cervical adenopathy.     Upper Body:     Right upper body: No supraclavicular, axillary or epitrochlear adenopathy.     Left upper body: No supraclavicular, axillary or epitrochlear adenopathy.     Lower Body: No right inguinal adenopathy. No left inguinal adenopathy.  Skin:    General: Skin is warm and dry.     Findings: No lesion or rash.  Neurological:     Mental Status: He is alert and oriented to person, place, and time.       Assessment and Plan:  VOSHON PETRO is a 72 y.o. male presenting to the [facility] Department for STI screening  1. Screening for venereal disease  - Syphilis Serology, [place] [facility] - Chlamydia/GC NAA, Confirmation - Chlamydia/Gonorrhea [place] [facility] - Chlamydia/Gonorrhea [place] [facility] - HIV/HCV [place] [facility] - HBV Antigen/Antibody [facility]  2. History of syphilis    Patient does not have STI symptoms Patient accepted all screenings including  urine GC/Chlamydia, and blood work for HIV/Syphilis. Patient meets criteria for HepB screening? Yes. Ordered? yes Patient meets criteria for HepC screening? Yes. Ordered? yes Recommended condom use with all sex Discussed importance of condom use for STI prevent  Treat positive test results per standing order. Discussed time line for State Lab results and that patient will be called with positive results and encouraged patient to call if he had not heard in 2 weeks Recommended repeat testing in 3 months with positive  results. Recommended returning for continued or worsening symptoms.   Return if symptoms worsen or fail to improve, for STI screening.  No future appointments. Total time spent 20 minutes  Lenice Llamas, [place]

## 2022-08-22 NOTE — Progress Notes (Signed)
Pt here for STI screening visit.  Denies symptoms.  No in house labs performed.  Condoms declined.-Collins Scotland, RN

## 2022-08-24 LAB — CHLAMYDIA/GC NAA, CONFIRMATION
Chlamydia trachomatis, NAA: NEGATIVE
Neisseria gonorrhoeae, NAA: NEGATIVE

## 2022-10-23 IMAGING — DX DG ABD PORTABLE 1V
1 series · 1 of 1 positions shown · non-contrast
Comparison: Abdominal radiograph 01/20/2021.

CLINICAL DATA: 69-year-old male with history of small-bowel
obstruction. 8 hour delay film.

EXAM:
PORTABLE ABDOMEN - 1 VIEW

[abdomen supine]
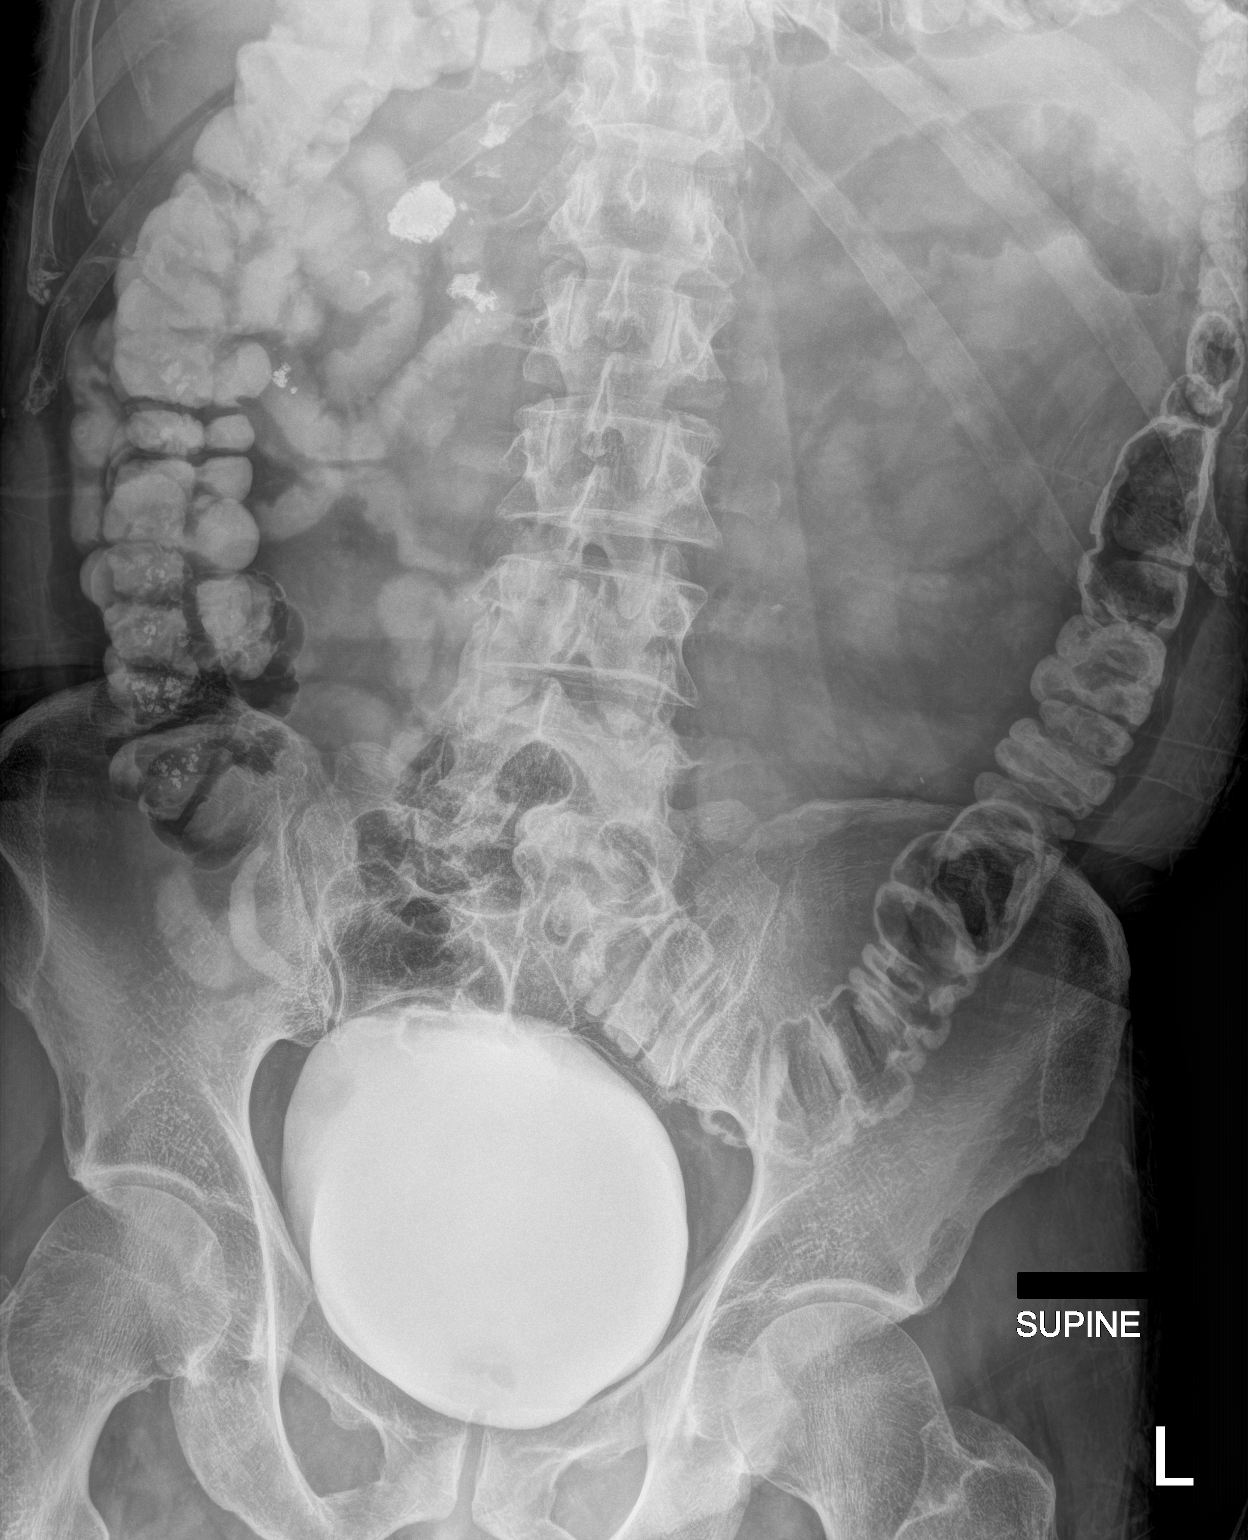

[1 of 1 positions shown; findings below may reference images not displayed]

FINDINGS: Previously noted dilated loops of bowel have largely resolved, with
minimal small-bowel dilatation remaining in the left upper quadrant
of the abdomen, where the bowel is focally dilated up to 4.3 cm in
diameter. Oral contrast material is now noted throughout the colon.
Appendix is also visualized, filled with contrast. Large amount of
iodinated contrast material is also noted in the urinary bladder.
IMPRESSION: 1. Persistent but improving partial small bowel obstruction, as
above.

## 2022-12-23 ENCOUNTER — Other Ambulatory Visit: Payer: Self-pay | Admitting: Orthopedic Surgery

## 2022-12-23 DIAGNOSIS — M4802 Spinal stenosis, cervical region: Secondary | ICD-10-CM

## 2022-12-28 ENCOUNTER — Ambulatory Visit
Admission: RE | Admit: 2022-12-28 | Discharge: 2022-12-28 | Disposition: A | Discharge status: Home / Self Care | Payer: Medicare Other | Source: Ambulatory Visit | Attending: Orthopedic Surgery | Admitting: Orthopedic Surgery

## 2022-12-28 DIAGNOSIS — M4802 Spinal stenosis, cervical region: Secondary | ICD-10-CM

## 2024-01-02 ENCOUNTER — Other Ambulatory Visit: Payer: Self-pay | Admitting: Internal Medicine

## 2024-01-02 DIAGNOSIS — Z0000 Encounter for general adult medical examination without abnormal findings: Secondary | ICD-10-CM

## 2024-01-02 DIAGNOSIS — Z136 Encounter for screening for cardiovascular disorders: Secondary | ICD-10-CM

## 2024-01-02 DIAGNOSIS — I700 Atherosclerosis of aorta: Secondary | ICD-10-CM

## 2024-01-19 ENCOUNTER — Telehealth: Payer: Self-pay | Admitting: Infectious Diseases

## 2024-01-19 NOTE — Telephone Encounter (Signed)
 Hi Diminique,  I follow this pt for HIV. He would like to start cabenuva.   I dont know J[place] procedure for getting people started on this at [facility]. Does it need to go through pharmacy for prior authorization? If so could we try to get him started next week when we have the other patients coming in for injfections? Thanks Deatrice

## 2024-01-22 ENCOUNTER — Other Ambulatory Visit (HOSPITAL_COMMUNITY): Payer: Self-pay

## 2024-01-22 ENCOUNTER — Telehealth: Payer: Self-pay

## 2024-01-22 NOTE — Telephone Encounter (Signed)
 Pharmacy Patient Advocate Encounter   Received notification from [employer] that prior authorization for CABENUVA  is required/requested.   Insurance verification completed.   The patient is insured through [facility].   Per test claim: PA required; PA submitted to above mentioned insurance via [employer] Key/confirmation #/EOC [id number] Status is pending

## 2024-01-22 NOTE — Telephone Encounter (Signed)
 Seems like a good candidate based on only taking Biktarvy and no apparent drug resistance mutations as well as keeping appointments. I would like to see consistent VL < 50 since his in November was 110 and then December was 60. Probably could start now given we know it was from missing a few tablets, thought it would be nice to check VL again in a couple months to ensure it remains undetectable. GLENWOOD Palma

## 2024-01-22 NOTE — Telephone Encounter (Signed)
 Pharmacy Patient Advocate Encounter  Received notification from [employer] that Prior Authorization for CABENUVA  has been DENIED.  Full denial letter will be uploaded to the media tab. See denial reason below.   PA #/Case ID/Reference #: [id number]

## 2024-01-22 NOTE — Telephone Encounter (Signed)
 I will look into this tomorrow and work on appealing.

## 2024-01-23 ENCOUNTER — Other Ambulatory Visit (HOSPITAL_COMMUNITY): Payer: Self-pay

## 2024-01-23 NOTE — Telephone Encounter (Signed)
 Pharmacy Patient Advocate Encounter  Received notification from [employer] that Prior Authorization for CABENUVA  has been APPROVED from 01/22/24 to 01/21/25. Ran test claim, Copay is $0. This test claim was processed through [facility]- copay amounts may vary at other pharmacies due to pharmacy/plan contracts, or as the patient moves through the different stages of their insurance plan.  May fill with us  or [facility]   PA #/Case ID/Reference #: [id number]

## 2024-01-23 NOTE — Telephone Encounter (Signed)
 Submitted and faxed appeal today and will wait insurance response.

## 2024-01-24 MED ORDER — CABOTEGRAVIR & RILPIVIRINE ER 600 & 900 MG/3ML IM SUER: 1.0000 | INTRAMUSCULAR | 1 refills | Status: DC

## 2024-01-24 MED ORDER — CABOTEGRAVIR & RILPIVIRINE ER 600 & 900 MG/3ML IM SUER: 1.0000 | INTRAMUSCULAR | 5 refills | Status: DC

## 2024-01-24 NOTE — Telephone Encounter (Signed)
 Charmaine can you make sure his script is delivered to [facility] and let me know when it should be delivered and I can get him scheduled

## 2024-01-24 NOTE — Telephone Encounter (Signed)
16:26

## 2024-01-24 NOTE — Addendum Note (Signed)
 Addended by: WADDELL ALAN PARAS on: 01/24/2024 10:37 AM   Modules accepted: Orders

## 2024-01-24 NOTE — Telephone Encounter (Signed)
 Spoke with patient about Cabenuva  counseling. All questions answered. Sending script to [facility] now. Diminique - you can reach out to him for scheduling. Told him he may not be able to start until next year which he was fine with.  Counseled that Cabenuva  is two separate intramuscular injections in the gluteal muscle on each side for each visit. Explained that the second injection is 30 days after the initial injection then every 2 months thereafter. Discussed the need for viral load monitoring every 2 months for the first 6 months and then periodically afterwards as their provider sees the need. Discussed the rare but significant chance of developing resistance despite compliance. Explained that showing up to injection appointments is very important and warned that if 2 appointments are missed, it will be reassessed by their provider whether they are a good candidate for injection therapy. Counseled on possible side effects associated with the injections such as injection site pain, which is usually mild to moderate in nature, injection site nodules, and injection site reactions. Asked to call the clinic or send me a mychart message if they experience any issues, such as fatigue, nausea, headache, rash, or dizziness. Advised that they can take ibuprofen or tylenol  for injection site pain if needed.   Alan Geralds, PharmD, CPP, BCIDP, AAHIVP Clinical Pharmacist Practitioner Infectious Diseases Clinical Pharmacist [facility]

## 2024-01-29 NOTE — Telephone Encounter (Signed)
 Received patient's Cabenuva  from [employer] on 01/26/24 and placed in the refrigerator at [facility]

## 2024-01-30 NOTE — Telephone Encounter (Signed)
 I attempted to reach the patient to schedule him for his first Cab injection. No answer. I will try the patient again Friday

## 2024-02-06 NOTE — Telephone Encounter (Signed)
 Attempted to reach the patient. No answer and LVM for him to call our office back Jericha Bryden ONEIDA Ligas, CMA

## 2024-02-20 ENCOUNTER — Ambulatory Visit: Attending: Infectious Diseases

## 2024-02-20 ENCOUNTER — Other Ambulatory Visit (HOSPITAL_COMMUNITY): Payer: Self-pay

## 2024-02-20 VITALS — BP 133/81 | HR 82

## 2024-02-20 DIAGNOSIS — B20 Human immunodeficiency virus [HIV] disease: Secondary | ICD-10-CM

## 2024-02-20 MED ORDER — CABOTEGRAVIR & RILPIVIRINE ER 600 & 900 MG/3ML IM SUER
1.0000 | Freq: Once | INTRAMUSCULAR | Status: AC
  Administered 2024-02-20: 1 via INTRAMUSCULAR

## 2024-02-20 NOTE — Progress Notes (Signed)
 PATIENT IN OFFICE FOR INITIAL CABENUVA  INJECTION AND TOLERATED WELL.  PATIENT HAD NO CONCERNS.  Tony Franco ONEIDA Ligas, CMA

## 2024-02-21 NOTE — Telephone Encounter (Signed)
 Patient seen 02/20/24 and received his initial Cab  injection. Dr. Epifanio aware. Abeni Finchum ONEIDA Ligas, CMA

## 2024-02-22 ENCOUNTER — Other Ambulatory Visit (HOSPITAL_COMMUNITY): Payer: Self-pay

## 2024-02-28 ENCOUNTER — Other Ambulatory Visit (HOSPITAL_COMMUNITY): Payer: Self-pay

## 2024-03-13 ENCOUNTER — Telehealth: Payer: Self-pay

## 2024-03-13 ENCOUNTER — Other Ambulatory Visit: Payer: Self-pay

## 2024-03-13 ENCOUNTER — Other Ambulatory Visit (HOSPITAL_COMMUNITY): Payer: Self-pay

## 2024-03-13 ENCOUNTER — Other Ambulatory Visit: Payer: Self-pay | Admitting: Pharmacist

## 2024-03-13 DIAGNOSIS — B20 Human immunodeficiency virus [HIV] disease: Secondary | ICD-10-CM

## 2024-03-13 MED ORDER — CABOTEGRAVIR & RILPIVIRINE ER 600 & 900 MG/3ML IM SUER
1.0000 | INTRAMUSCULAR | 0 refills | Status: AC
  Filled 2024-03-13: qty 6, 30d supply, fill #0

## 2024-03-13 NOTE — Telephone Encounter (Signed)
[facility] Patient Advocate Encounter   I was successful in securing patient a $5000.00 grant from [employer] (PAF) to provide copayment coverage for [place].  This will make the out of pocket cost $0.00.     I have spoken with the patient.    The billing information is as follows and has been shared with [place].           Patient knows to call the office with questions or concerns.  Tony Franco, [facility] Patient [facility] Phone: [phone] Fax:  [phone]

## 2024-03-13 NOTE — Progress Notes (Signed)
 Specialty Pharmacy Initial Fill Coordination Note  CAYMAN KIELBASA is a 74 y.o. male contacted today regarding initial fill of specialty medication(s) Cabotegravir  & Rilpivirine  (CABENUVA )   Patient requested Courier to Provider Office   Delivery date: 03/19/24   Verified address: [address] [place] [address]   Medication will be filled on 03/18/24.   Patient is aware of 0.00 copayment.

## 2024-03-21 ENCOUNTER — Ambulatory Visit
